# Patient Record
Sex: Female | Born: 1965 | Race: White | Hispanic: No | Smoking: Never smoker
Health system: Southern US, Community
[De-identification: ages and names within clinical notes are randomized; demographics above are authoritative.]

## PROBLEM LIST (undated history)

## (undated) DIAGNOSIS — R600 Localized edema: Secondary | ICD-10-CM

## (undated) DIAGNOSIS — L723 Sebaceous cyst: Secondary | ICD-10-CM

## (undated) DIAGNOSIS — R7303 Prediabetes: Secondary | ICD-10-CM

## (undated) DIAGNOSIS — E538 Deficiency of other specified B group vitamins: Secondary | ICD-10-CM

## (undated) DIAGNOSIS — G4700 Insomnia, unspecified: Secondary | ICD-10-CM

## (undated) DIAGNOSIS — F909 Attention-deficit hyperactivity disorder, unspecified type: Secondary | ICD-10-CM

## (undated) DIAGNOSIS — J309 Allergic rhinitis, unspecified: Secondary | ICD-10-CM

## (undated) DIAGNOSIS — I8000 Phlebitis and thrombophlebitis of superficial vessels of unspecified lower extremity: Secondary | ICD-10-CM

## (undated) DIAGNOSIS — E559 Vitamin D deficiency, unspecified: Secondary | ICD-10-CM

## (undated) DIAGNOSIS — Z86718 Personal history of other venous thrombosis and embolism: Secondary | ICD-10-CM

## (undated) DIAGNOSIS — R0789 Other chest pain: Secondary | ICD-10-CM

## (undated) DIAGNOSIS — F329 Major depressive disorder, single episode, unspecified: Secondary | ICD-10-CM

## (undated) DIAGNOSIS — F410 Panic disorder [episodic paroxysmal anxiety] without agoraphobia: Secondary | ICD-10-CM

## (undated) DIAGNOSIS — F319 Bipolar disorder, unspecified: Secondary | ICD-10-CM

## (undated) DIAGNOSIS — K5900 Constipation, unspecified: Secondary | ICD-10-CM

## (undated) DIAGNOSIS — B070 Plantar wart: Secondary | ICD-10-CM

## (undated) DIAGNOSIS — F411 Generalized anxiety disorder: Secondary | ICD-10-CM

## (undated) HISTORY — DX: Attention-deficit hyperactivity disorder, unspecified type: F90.9

## (undated) HISTORY — DX: Constipation, unspecified: K59.00

## (undated) HISTORY — DX: Generalized anxiety disorder: F41.1

## (undated) HISTORY — DX: Prediabetes: R73.03

## (undated) HISTORY — DX: Insomnia, unspecified: G47.00

## (undated) HISTORY — DX: Vitamin D deficiency, unspecified: E55.9

## (undated) HISTORY — DX: Sebaceous cyst: L72.3

## (undated) HISTORY — DX: Other chest pain: R07.89

## (undated) HISTORY — DX: Deficiency of other specified B group vitamins: E53.8

## (undated) HISTORY — DX: Allergic rhinitis, unspecified: J30.9

## (undated) HISTORY — DX: Panic disorder (episodic paroxysmal anxiety): F41.0

## (undated) HISTORY — PX: TONSILLECTOMY: SUR1361

## (undated) HISTORY — PX: OTHER SURGICAL HISTORY: SHX169

## (undated) HISTORY — DX: Phlebitis and thrombophlebitis of superficial vessels of unspecified lower extremity: I80.00

## (undated) HISTORY — DX: Major depressive disorder, single episode, unspecified: F32.9

## (undated) HISTORY — DX: Personal history of other venous thrombosis and embolism: Z86.718

## (undated) HISTORY — DX: Localized edema: R60.0

## (undated) HISTORY — DX: Plantar wart: B07.0

## (undated) HISTORY — DX: Bipolar disorder, unspecified: F31.9

---

## 1999-04-20 ENCOUNTER — Other Ambulatory Visit: Admission: RE | Admit: 1999-04-20 | Discharge: 1999-04-20 | Payer: Self-pay | Admitting: Obstetrics and Gynecology

## 1999-10-19 ENCOUNTER — Ambulatory Visit (HOSPITAL_COMMUNITY): Admission: RE | Admit: 1999-10-19 | Discharge: 1999-10-19 | Payer: Self-pay | Admitting: Internal Medicine

## 1999-12-28 ENCOUNTER — Ambulatory Visit (HOSPITAL_COMMUNITY): Admission: RE | Admit: 1999-12-28 | Discharge: 1999-12-28 | Payer: Self-pay | Admitting: Internal Medicine

## 1999-12-28 ENCOUNTER — Encounter: Payer: Self-pay | Admitting: Internal Medicine

## 2000-04-25 ENCOUNTER — Other Ambulatory Visit: Admission: RE | Admit: 2000-04-25 | Discharge: 2000-04-25 | Payer: Self-pay | Admitting: Obstetrics and Gynecology

## 2002-12-05 ENCOUNTER — Other Ambulatory Visit: Admission: RE | Admit: 2002-12-05 | Discharge: 2002-12-05 | Payer: Self-pay | Admitting: Obstetrics & Gynecology

## 2003-12-25 ENCOUNTER — Other Ambulatory Visit: Admission: RE | Admit: 2003-12-25 | Discharge: 2003-12-25 | Payer: Self-pay | Admitting: Obstetrics and Gynecology

## 2005-01-04 ENCOUNTER — Ambulatory Visit: Payer: Self-pay | Admitting: Internal Medicine

## 2005-01-26 ENCOUNTER — Ambulatory Visit: Payer: Self-pay | Admitting: Internal Medicine

## 2005-04-28 ENCOUNTER — Ambulatory Visit: Payer: Self-pay | Admitting: Internal Medicine

## 2005-08-29 ENCOUNTER — Ambulatory Visit: Payer: Self-pay | Admitting: Internal Medicine

## 2006-06-17 ENCOUNTER — Ambulatory Visit: Payer: Self-pay | Admitting: Family Medicine

## 2006-06-19 ENCOUNTER — Ambulatory Visit: Payer: Self-pay | Admitting: Family Medicine

## 2007-10-09 ENCOUNTER — Telehealth: Payer: Self-pay | Admitting: Internal Medicine

## 2008-01-10 ENCOUNTER — Ambulatory Visit: Payer: Self-pay | Admitting: Internal Medicine

## 2008-01-10 DIAGNOSIS — F3289 Other specified depressive episodes: Secondary | ICD-10-CM

## 2008-01-10 DIAGNOSIS — F410 Panic disorder [episodic paroxysmal anxiety] without agoraphobia: Secondary | ICD-10-CM | POA: Insufficient documentation

## 2008-01-10 DIAGNOSIS — F3170 Bipolar disorder, currently in remission, most recent episode unspecified: Secondary | ICD-10-CM | POA: Insufficient documentation

## 2008-01-10 DIAGNOSIS — R0789 Other chest pain: Secondary | ICD-10-CM | POA: Insufficient documentation

## 2008-01-10 DIAGNOSIS — F411 Generalized anxiety disorder: Secondary | ICD-10-CM

## 2008-01-10 DIAGNOSIS — J309 Allergic rhinitis, unspecified: Secondary | ICD-10-CM

## 2008-01-10 DIAGNOSIS — F319 Bipolar disorder, unspecified: Secondary | ICD-10-CM | POA: Insufficient documentation

## 2008-01-10 DIAGNOSIS — F329 Major depressive disorder, single episode, unspecified: Secondary | ICD-10-CM

## 2008-01-10 HISTORY — DX: Other specified depressive episodes: F32.89

## 2008-01-10 HISTORY — DX: Other chest pain: R07.89

## 2008-01-10 HISTORY — DX: Major depressive disorder, single episode, unspecified: F32.9

## 2008-01-10 HISTORY — DX: Allergic rhinitis, unspecified: J30.9

## 2008-01-10 HISTORY — DX: Generalized anxiety disorder: F41.1

## 2008-01-10 HISTORY — DX: Panic disorder (episodic paroxysmal anxiety): F41.0

## 2008-01-10 HISTORY — DX: Bipolar disorder, unspecified: F31.9

## 2008-04-20 ENCOUNTER — Emergency Department (HOSPITAL_COMMUNITY): Admission: EM | Admit: 2008-04-20 | Discharge: 2008-04-20 | Payer: Self-pay | Admitting: Emergency Medicine

## 2008-04-22 ENCOUNTER — Ambulatory Visit: Payer: Self-pay | Admitting: Internal Medicine

## 2008-05-26 ENCOUNTER — Ambulatory Visit: Payer: Self-pay | Admitting: Internal Medicine

## 2008-05-26 ENCOUNTER — Telehealth: Payer: Self-pay | Admitting: Internal Medicine

## 2008-05-26 DIAGNOSIS — S93409A Sprain of unspecified ligament of unspecified ankle, initial encounter: Secondary | ICD-10-CM | POA: Insufficient documentation

## 2008-06-06 ENCOUNTER — Telehealth: Payer: Self-pay | Admitting: Internal Medicine

## 2008-06-25 ENCOUNTER — Telehealth: Payer: Self-pay | Admitting: Internal Medicine

## 2008-07-03 ENCOUNTER — Emergency Department (HOSPITAL_COMMUNITY): Admission: EM | Admit: 2008-07-03 | Discharge: 2008-07-04 | Payer: Self-pay | Admitting: Emergency Medicine

## 2008-07-04 ENCOUNTER — Encounter: Payer: Self-pay | Admitting: Internal Medicine

## 2008-07-09 ENCOUNTER — Encounter: Payer: Self-pay | Admitting: Internal Medicine

## 2008-07-14 ENCOUNTER — Ambulatory Visit: Payer: Self-pay | Admitting: Internal Medicine

## 2008-07-14 ENCOUNTER — Telehealth: Payer: Self-pay | Admitting: Internal Medicine

## 2008-07-21 ENCOUNTER — Telehealth (INDEPENDENT_AMBULATORY_CARE_PROVIDER_SITE_OTHER): Payer: Self-pay | Admitting: *Deleted

## 2008-07-24 ENCOUNTER — Telehealth: Payer: Self-pay | Admitting: Internal Medicine

## 2008-12-29 ENCOUNTER — Ambulatory Visit: Payer: Self-pay | Admitting: Internal Medicine

## 2008-12-29 DIAGNOSIS — R1031 Right lower quadrant pain: Secondary | ICD-10-CM | POA: Insufficient documentation

## 2008-12-29 DIAGNOSIS — B070 Plantar wart: Secondary | ICD-10-CM

## 2008-12-29 HISTORY — DX: Plantar wart: B07.0

## 2009-01-14 ENCOUNTER — Encounter: Payer: Self-pay | Admitting: Internal Medicine

## 2009-02-19 ENCOUNTER — Ambulatory Visit (HOSPITAL_COMMUNITY): Admission: RE | Admit: 2009-02-19 | Discharge: 2009-02-19 | Payer: Self-pay | Admitting: Surgery

## 2009-03-03 ENCOUNTER — Encounter: Payer: Self-pay | Admitting: Internal Medicine

## 2009-03-20 ENCOUNTER — Encounter: Payer: Self-pay | Admitting: Internal Medicine

## 2009-03-23 ENCOUNTER — Telehealth: Payer: Self-pay | Admitting: Internal Medicine

## 2009-03-25 ENCOUNTER — Telehealth (INDEPENDENT_AMBULATORY_CARE_PROVIDER_SITE_OTHER): Payer: Self-pay | Admitting: *Deleted

## 2009-03-25 ENCOUNTER — Ambulatory Visit: Payer: Self-pay | Admitting: Internal Medicine

## 2009-04-22 ENCOUNTER — Ambulatory Visit: Payer: Self-pay | Admitting: Internal Medicine

## 2009-06-17 ENCOUNTER — Telehealth: Payer: Self-pay | Admitting: Internal Medicine

## 2009-06-17 ENCOUNTER — Ambulatory Visit: Payer: Self-pay | Admitting: Internal Medicine

## 2009-06-17 DIAGNOSIS — M542 Cervicalgia: Secondary | ICD-10-CM | POA: Insufficient documentation

## 2009-07-15 ENCOUNTER — Telehealth (INDEPENDENT_AMBULATORY_CARE_PROVIDER_SITE_OTHER): Payer: Self-pay | Admitting: *Deleted

## 2009-07-29 ENCOUNTER — Ambulatory Visit: Payer: Self-pay | Admitting: Internal Medicine

## 2009-08-10 ENCOUNTER — Telehealth: Payer: Self-pay | Admitting: Internal Medicine

## 2009-08-14 ENCOUNTER — Encounter: Payer: Self-pay | Admitting: Internal Medicine

## 2009-08-21 ENCOUNTER — Telehealth: Payer: Self-pay | Admitting: *Deleted

## 2009-09-07 ENCOUNTER — Telehealth: Payer: Self-pay | Admitting: Internal Medicine

## 2009-09-21 ENCOUNTER — Telehealth: Payer: Self-pay | Admitting: *Deleted

## 2009-10-02 ENCOUNTER — Emergency Department (HOSPITAL_COMMUNITY): Admission: EM | Admit: 2009-10-02 | Discharge: 2009-10-02 | Payer: Self-pay | Admitting: Emergency Medicine

## 2009-10-06 ENCOUNTER — Telehealth: Payer: Self-pay | Admitting: Internal Medicine

## 2009-10-08 ENCOUNTER — Ambulatory Visit: Payer: Self-pay | Admitting: Family Medicine

## 2009-10-08 ENCOUNTER — Telehealth: Payer: Self-pay | Admitting: Family Medicine

## 2009-10-08 DIAGNOSIS — M25569 Pain in unspecified knee: Secondary | ICD-10-CM | POA: Insufficient documentation

## 2009-10-13 ENCOUNTER — Ambulatory Visit: Payer: Self-pay | Admitting: Sports Medicine

## 2009-10-14 ENCOUNTER — Telehealth (INDEPENDENT_AMBULATORY_CARE_PROVIDER_SITE_OTHER): Payer: Self-pay | Admitting: *Deleted

## 2009-10-20 ENCOUNTER — Ambulatory Visit: Payer: Self-pay | Admitting: Sports Medicine

## 2009-10-28 ENCOUNTER — Ambulatory Visit: Payer: Self-pay | Admitting: Internal Medicine

## 2009-10-28 DIAGNOSIS — F909 Attention-deficit hyperactivity disorder, unspecified type: Secondary | ICD-10-CM

## 2009-10-28 HISTORY — DX: Attention-deficit hyperactivity disorder, unspecified type: F90.9

## 2009-10-29 ENCOUNTER — Ambulatory Visit: Payer: Self-pay | Admitting: Sports Medicine

## 2009-10-30 ENCOUNTER — Encounter: Payer: Self-pay | Admitting: Sports Medicine

## 2009-11-01 ENCOUNTER — Encounter: Admission: RE | Admit: 2009-11-01 | Discharge: 2009-11-01 | Payer: Self-pay | Admitting: Sports Medicine

## 2009-11-02 ENCOUNTER — Encounter: Payer: Self-pay | Admitting: Sports Medicine

## 2009-11-10 ENCOUNTER — Ambulatory Visit: Payer: Self-pay | Admitting: Sports Medicine

## 2009-12-02 ENCOUNTER — Ambulatory Visit: Payer: Self-pay | Admitting: Sports Medicine

## 2009-12-03 ENCOUNTER — Encounter: Payer: Self-pay | Admitting: Internal Medicine

## 2009-12-03 ENCOUNTER — Encounter: Payer: Self-pay | Admitting: Cardiovascular Disease

## 2009-12-03 ENCOUNTER — Encounter: Payer: Self-pay | Admitting: Sports Medicine

## 2009-12-04 ENCOUNTER — Ambulatory Visit: Payer: Self-pay

## 2009-12-04 ENCOUNTER — Encounter: Payer: Self-pay | Admitting: Cardiovascular Disease

## 2009-12-15 ENCOUNTER — Ambulatory Visit (HOSPITAL_BASED_OUTPATIENT_CLINIC_OR_DEPARTMENT_OTHER): Admission: RE | Admit: 2009-12-15 | Discharge: 2009-12-15 | Payer: Self-pay | Admitting: Orthopedic Surgery

## 2009-12-17 ENCOUNTER — Encounter: Payer: Self-pay | Admitting: Sports Medicine

## 2010-01-04 ENCOUNTER — Ambulatory Visit: Payer: Self-pay | Admitting: Internal Medicine

## 2010-01-29 ENCOUNTER — Ambulatory Visit: Payer: Self-pay

## 2010-01-29 ENCOUNTER — Encounter: Payer: Self-pay | Admitting: Cardiovascular Disease

## 2010-01-29 DIAGNOSIS — M79609 Pain in unspecified limb: Secondary | ICD-10-CM | POA: Insufficient documentation

## 2010-04-13 ENCOUNTER — Ambulatory Visit: Payer: Self-pay | Admitting: Internal Medicine

## 2010-04-30 ENCOUNTER — Telehealth: Payer: Self-pay | Admitting: Internal Medicine

## 2010-06-09 ENCOUNTER — Encounter: Payer: Self-pay | Admitting: Sports Medicine

## 2010-07-05 ENCOUNTER — Ambulatory Visit: Payer: Self-pay | Admitting: Internal Medicine

## 2010-07-05 DIAGNOSIS — I8000 Phlebitis and thrombophlebitis of superficial vessels of unspecified lower extremity: Secondary | ICD-10-CM | POA: Insufficient documentation

## 2010-07-05 HISTORY — DX: Phlebitis and thrombophlebitis of superficial vessels of unspecified lower extremity: I80.00

## 2010-07-13 ENCOUNTER — Telehealth: Payer: Self-pay | Admitting: Internal Medicine

## 2010-08-19 ENCOUNTER — Telehealth: Payer: Self-pay | Admitting: Internal Medicine

## 2010-08-20 ENCOUNTER — Telehealth: Payer: Self-pay | Admitting: Internal Medicine

## 2010-08-30 ENCOUNTER — Telehealth: Payer: Self-pay | Admitting: Internal Medicine

## 2010-09-17 ENCOUNTER — Ambulatory Visit: Payer: Self-pay | Admitting: Family Medicine

## 2010-09-17 DIAGNOSIS — G4700 Insomnia, unspecified: Secondary | ICD-10-CM

## 2010-09-17 HISTORY — DX: Insomnia, unspecified: G47.00

## 2010-09-21 ENCOUNTER — Encounter: Payer: Self-pay | Admitting: Family Medicine

## 2010-09-21 ENCOUNTER — Telehealth: Payer: Self-pay | Admitting: Family Medicine

## 2010-09-27 ENCOUNTER — Telehealth: Payer: Self-pay | Admitting: Internal Medicine

## 2010-10-04 ENCOUNTER — Ambulatory Visit: Payer: Self-pay | Admitting: Internal Medicine

## 2010-10-11 ENCOUNTER — Telehealth: Payer: Self-pay | Admitting: Internal Medicine

## 2010-11-17 ENCOUNTER — Telehealth: Payer: Self-pay | Admitting: Internal Medicine

## 2010-11-24 ENCOUNTER — Ambulatory Visit
Admission: RE | Admit: 2010-11-24 | Discharge: 2010-11-24 | Payer: Self-pay | Source: Home / Self Care | Attending: Family Medicine | Admitting: Family Medicine

## 2010-12-13 ENCOUNTER — Telehealth: Payer: Self-pay | Admitting: Internal Medicine

## 2010-12-13 ENCOUNTER — Ambulatory Visit: Admit: 2010-12-13 | Payer: Self-pay | Admitting: Family Medicine

## 2010-12-13 ENCOUNTER — Emergency Department (HOSPITAL_COMMUNITY)
Admission: EM | Admit: 2010-12-13 | Discharge: 2010-12-14 | Disposition: A | Discharge status: Other Acute Inpatient Hospital | Payer: Self-pay | Source: Home / Self Care | Admitting: Emergency Medicine

## 2010-12-14 ENCOUNTER — Inpatient Hospital Stay (HOSPITAL_COMMUNITY)
Admission: AD | Admit: 2010-12-14 | Discharge: 2010-12-15 | Payer: Self-pay | Source: Home / Self Care | Attending: Psychiatry | Admitting: Psychiatry

## 2010-12-15 LAB — BASIC METABOLIC PANEL
BUN: 13 mg/dL (ref 6–23)
CO2: 28 mEq/L (ref 19–32)
Calcium: 9.5 mg/dL (ref 8.4–10.5)
Chloride: 103 mEq/L (ref 96–112)
Creatinine, Ser: 0.81 mg/dL (ref 0.4–1.2)
GFR calc Af Amer: >60 mL/min (ref >60)
GFR calc non Af Amer: >60 mL/min (ref >60)
Glucose, Bld: 107 mg/dL — ABNORMAL HIGH (ref 70–99)
Potassium: 4.2 mEq/L (ref 3.5–5.1)
Sodium: 140 mEq/L (ref 135–145)

## 2010-12-15 LAB — RAPID URINE DRUG SCREEN, HOSP PERFORMED
Amphetamines: NOT DETECTED
Barbiturates: NOT DETECTED
Benzodiazepines: NOT DETECTED
Cocaine: NOT DETECTED
Opiates: NOT DETECTED
Tetrahydrocannabinol: NOT DETECTED

## 2010-12-15 LAB — CBC
HCT: 38.5 % (ref 36.0–46.0)
Hemoglobin: 12.8 g/dL (ref 12.0–15.0)
MCH: 28.6 pg (ref 26.0–34.0)
MCHC: 33.2 g/dL (ref 30.0–36.0)
MCV: 86.1 fL (ref 78.0–100.0)
Platelets: 288 10*3/uL (ref 150–400)
RBC: 4.47 MIL/uL (ref 3.87–5.11)
RDW: 13 % (ref 11.5–15.5)
WBC: 6.9 10*3/uL (ref 4.0–10.5)

## 2010-12-15 LAB — ETHANOL: Alcohol, Ethyl (B): <5 mg/dL (ref 0–10)

## 2010-12-15 LAB — POCT PREGNANCY, URINE: Preg Test, Ur: NEGATIVE

## 2010-12-17 ENCOUNTER — Encounter: Payer: Self-pay | Admitting: *Deleted

## 2010-12-19 ENCOUNTER — Encounter: Payer: Self-pay | Admitting: Sports Medicine

## 2010-12-20 ENCOUNTER — Encounter: Payer: Self-pay | Admitting: *Deleted

## 2010-12-24 NOTE — H&P (Signed)
[name], [name] NO.:  [id number]  MEDICAL RECORD NO.:  [id number]          PATIENT TYPE:  IPS  LOCATION:  0400                          FACILITY:  [facility]  PHYSICIAN:  Anselm Jungling, MD  DATE OF BIRTH:  September 03, 1966  DATE OF ADMISSION:  12/14/2010 DATE OF DISCHARGE:                      PSYCHIATRIC ADMISSION ASSESSMENT   HISTORY OF PRESENT ILLNESS:  The patient brought herself to the emergency department stating that she needed to get sleep and did want to be left alone because she was not sure what she would do to herself. The patient reports she has had some new medication changes.  She has a history of bipolar disorder and was recently started on Prozac about 2 weeks ago.  Also had a change in her stimulant, switching from Vyvanse to Ritalin.  She began to feel unorganized in her thinking.  She felt that she was having increased energy with an improvement in her concentration but was not sleeping well.  She was having thoughts about not wanting to be here and to "shut herself off."  Denies any psychotic symptoms.  PAST PSYCHIATRIC HISTORY:  First admission to the [facility].  Has a history bipolar disorder.  Has a history of mania in the past.  She recently start seeing Dr. Lafayette Dragon for psychiatric outpatient services and [name] for counseling.  SOCIAL HISTORY:  This is a 45 year old divorced female.  She resides in [place].  She has an 50 year old child who lives with the father. 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  The patient denies any alcohol or substance use.  Primary care provider is Dr. Darryll Capers.  MEDICAL PROBLEMS:  The patient reports with feeling healthy.  Denies any acute or chronic health issues.  FAMILY HISTORY:  None.  MEDICATIONS:  Listed a recent change of medications: 1  Was started on Prozac 20 mg for approximately 2 months per her primary care provider. 1. Ambien 5 mg for  sleep per her PCP. 2. Klonopin 1 mg b.i.d. as needed.  Prescribed by Massie Maroon in the     past. 3. Focalin XR 40 mg daily.  Recently started by Dr. Lafayette Dragon for the past     2 weeks and was taken off Vyvanse and Aplenzin 348 mg daily for     depression. 4. Lamictal 200 mg b.i.d.  The patient reports compliance with her medications.  DRUG ALLERGIES:  No known allergies.  PHYSICAL EXAMINATION:  This is a middle-aged female (45 years old).  She was assessed in the emergency department.  The physical exam was reviewed.  Of note, the patient had pressured speech, did not make eye contact, was counseling fidgety and twitching her limbs but no repetitive facial movements.  Easily distracted by changes in environment, agitated.  Had to be asked questions multiple times.  Was forgetting questions and then refusing to answer.  She also was disheveled.  LABORATORY DATA:  A BMET just shows a glucose of 107.  Alcohol level less than 5.  CBC is within normal limits.  Urine drug screen is negative.  MENTAL STATUS EXAM:  The patient is fully  alert.  She is in the day room, is cooperative for interview.  She is casually dressed.  She has fair eye contact.  Initially laughing.  Mood seems elevated.  She does appear to have decreased concentration.  Her thought processes are somewhat scattered, although overall gives a fair account of her changes in her medications and her symptoms.  Her speech is somewhat rapid.  The patient does not appear to be psychotic.  She does appear, in addition to being somewhat disheveled and her elevated, she also appears tired in appearance.  DIAGNOSES:  AXIS I:  Bipolar disorder, hypomania. AXIS II:  Deferred. AXIS III:  Is no known medical conditions. AXIS IV:  Is deferred. AXIS V:  Current is 30.  PLAN:  Is to discontinue her Prozac and her stimulant.  We will continue with her mood stabilizer.  We will also have Klonopin available on a p.r.n. basis.  We will continue with her  Ambien for sleep and offer a repeat dose.  Her tentative length of stay at this time is 2 to 4 days.     Landry Corporal, N.P.   ______________________________ Anselm Jungling, MD    JO/MEDQ  D:  12/14/2010  T:  12/14/2010  Job:  [id number]  Electronically Signed by Kristin PatriciaP. on 12/21/2010 02:07:20 PM Electronically Signed by Kristin Flash MD on 12/22/2010 08:46:04 AM

## 2010-12-27 ENCOUNTER — Telehealth: Payer: Self-pay | Admitting: Internal Medicine

## 2010-12-30 NOTE — Progress Notes (Signed)
Summary: salon name  Phone Note Call from Patient Call back at Work Phone [phone]   Caller: Patient Call For: Stacie Glaze MD Summary of Call: [phone] Is asking for name of salon that you recommended for her pedicare. Initial call taken by: Lynann Beaver CMA,  July 13, 2010 4:08 PM  Follow-up for Phone Call        [facility] [address] Follow-up by: Willy Eddy, LPN,  July 13, 2010 4:28 PM  Additional Follow-up for Phone Call Additional follow up Details #1::        Left message with info. Additional Follow-up by: Lynann Beaver CMA,  July 13, 2010 4:34 PM

## 2010-12-30 NOTE — Letter (Signed)
Summary: *Referral Letter  [facility]  [address]   [place], [place] [address]   Phone: [phone]  Fax: [phone]    12/02/2009 Salvatore Marvel, M.D. [facility] [address] [place], [place] [address] Fax: [phone]  Dear [name]:  Thank you in advance for agreeing to see my patient:  [name] [address] [place], [place]  [address]  Phone: [phone]  Reason for Referral: This is the lady we discussed that I think may have an intra-articular loose body or more significant PF joint injury than noted on MRI.  Procedures Requested: your evaluation and arthroscopy if needed.  Current Medical Problems: 1)  ATTENTION DEFICIT HYPERACTIVITY DISORDER, ADULT (ICD-314.01) 2)  CLOSED FRACTURE OF PATELLA (ICD-822.0) 3)  KNEE PAIN (ICD-719.46) 4)  NECK PAIN (ICD-723.1)   Current Medications: 1)  APLENZIN 348 MG XR24H-TAB (BUPROPION HBR) one by mouth daily 2)  KLONOPIN 1 MG  TABS (CLONAZEPAM) two times a day as needed 3)  LAMICTAL 200 MG TABS (LAMOTRIGINE) 1 tab two times a day 4)  AMBIEN 5 MG  TABS (ZOLPIDEM TARTRATE) at bedtime as needed 5)  VYVANSE 70 MG CAPS (LISDEXAMFETAMINE DIMESYLATE) one by mouth daily 6)  VICODIN 5-500 MG TABS (HYDROCODONE-ACETAMINOPHEN) oine tab every 4-6 hours as needed pain 7)  PERCOCET 5-325 MG TABS (OXYCODONE-ACETAMINOPHEN) take one tab q4hr as needed pain   Thank you again for agreeing to see our patient; please contact us if you have any further questions or need additional information.  Sincerely,  Vincent Gros MD

## 2010-12-30 NOTE — Assessment & Plan Note (Signed)
Summary: 3 month rov/njr/pt rescd//ccm   Vital Signs:  Patient profile:   45 year old female Height:      71 inches Weight:      152 pounds BMI:     21.28 Temp:     98.2 degrees F oral Pulse rate:   72 / minute Resp:     14 per minute BP sitting:   110 / 70  (left arm)  Vitals Entered By: Willy Eddy, LPN (Apr 13, 2010 2:48 PM) CC: roa-med regimen check, Headache   CC:  roa-med regimen check and Headache.  History of Present Illness: follow up medications and discussion of ADD meds and the fact that the afternoon is problematic she has been on vyvance discussion of medications and the potential side effects medication cost are better with meeting deductable she also has bipolar dz  complicated by  some degreee of PMDD    Preventive Screening-Counseling & Management  Alcohol-Tobacco     Smoking Status: never  Problems Prior to Update: 1)  Leg Pain, Left  (ICD-729.5) 2)  Attention Deficit Hyperactivity Disorder, Adult  (ICD-314.01) 3)  Closed Fracture of Patella  (ICD-822.0) 4)  Knee Pain  (ICD-719.46) 5)  Neck Pain  (ICD-723.1) 6)  Add  (ICD-314.00) 7)  Plantar Wart, Left  (ICD-078.12) 8)  Wrist Sprain, Left  (ICD-842.00) 9)  Abdominal Pain Right Lower Quadrant  (ICD-789.03) 10)  Ankle Sprain  (ICD-845.00) 11)  Muscle Spasm, Lumbar Region  (ICD-724.8) 12)  Chest Pain, Atypical  (ICD-786.59) 13)  Family History of Cad Female 1st Degree Relative <50  (ICD-V17.3) 14)  Depression  (ICD-311) 15)  Panic Disorder  (ICD-300.01) 16)  Bipolar Disorder Unspecified  (ICD-296.80) 17)  Allergic Rhinitis  (ICD-477.9) 18)  Anxiety  (ICD-300.00)  Current Problems (verified): 1)  Leg Pain, Left  (ICD-729.5) 2)  Attention Deficit Hyperactivity Disorder, Adult  (ICD-314.01) 3)  Closed Fracture of Patella  (ICD-822.0) 4)  Knee Pain  (ICD-719.46) 5)  Neck Pain  (ICD-723.1) 6)  Add  (ICD-314.00) 7)  Plantar Wart, Left  (ICD-078.12) 8)  Wrist Sprain, Left  (ICD-842.00) 9)   Abdominal Pain Right Lower Quadrant  (ICD-789.03) 10)  Ankle Sprain  (ICD-845.00) 11)  Muscle Spasm, Lumbar Region  (ICD-724.8) 12)  Chest Pain, Atypical  (ICD-786.59) 13)  Family History of Cad Female 1st Degree Relative <50  (ICD-V17.3) 14)  Depression  (ICD-311) 15)  Panic Disorder  (ICD-300.01) 16)  Bipolar Disorder Unspecified  (ICD-296.80) 17)  Allergic Rhinitis  (ICD-477.9) 18)  Anxiety  (ICD-300.00)  Current Medications (verified): 1)  Aplenzin 348 Mg Xr24h-Tab (Bupropion Hbr) .... One By Mouth Daily 2)  Klonopin 1 Mg  Tabs (Clonazepam) .... Two Times A Day As Needed 3)  Lamictal 200 Mg Tabs (Lamotrigine) .[name].. 1 Tab Two Times A Day 4)  Ambien 5 Mg  Tabs (Zolpidem Tartrate) .... At Bedtime As Needed 5)  Vyvanse 70 Mg Caps (Lisdexamfetamine Dimesylate) .... One By Mouth Daily  Allergies (verified): No Known Drug Allergies  Past History:  Family History: Last updated: 01/10/2008 father  had stent Family History of CAD Female 1st degree relative <50 mother has ablation therapy  Social History: Last updated: 10/20/2009 Married  but separated Never Smoked 10 year old daughter lives with father now expecting first child  Risk Factors: Smoking Status: never (04/13/2010)  Past medical, surgical, family and social histories (including risk factors) reviewed, and no changes noted (except as noted below).  Past Medical History: Reviewed history from 01/10/2008 and  no changes required. Anxiety Bipolar disorder Allergic rhinitis  Past Surgical History: Reviewed history from 01/10/2008 and no changes required. Denies surgical history  Family History: Reviewed history from 01/10/2008 and no changes required. father  had stent Family History of CAD Female 1st degree relative <50 mother has ablation therapy  Social History: Reviewed history from 10/20/2009 and no changes required. Married  but separated Never Smoked 46 year old daughter lives with father now expecting  first child  Review of Systems  The patient denies anorexia, fever, weight loss, weight gain, vision loss, decreased hearing, hoarseness, chest pain, syncope, dyspnea on exertion, peripheral edema, prolonged cough, headaches, hemoptysis, abdominal pain, melena, hematochezia, severe indigestion/heartburn, hematuria, incontinence, genital sores, muscle weakness, suspicious skin lesions, transient blindness, difficulty walking, depression, unusual weight change, abnormal bleeding, enlarged lymph nodes, angioedema, and breast masses.    Physical Exam  General:  Well-developed,well-nourished,in no acute distress; alert,appropriate and cooperative throughout examination Head:  Normocephalic and atraumatic without obvious abnormalities. No apparent alopecia or balding. Eyes:  pupils equal and pupils round.   Ears:  R ear normal and L ear normal.   Neck:  No deformities, masses, or tenderness noted. Lungs:  Normal respiratory effort, chest expands symmetrically. Lungs are clear to auscultation, no crackles or wheezes. Heart:  Normal rate and regular rhythm. S1 and S2 normal without gallop, murmur, click, rub or other extra sounds. Abdomen:  the pt has signifiacnt swelling at the sight of the hernia Msk:  RT knee exam shows no effusion; stable ligaments although she is very lax on ligamentous testing of all. ; negative Mcmurray's and provocative meniscal tests; non painful patellar compression; patellar and quadriceps tendons unremarkable  Left moderate effusion extension is lacking about 10 deg felxion only to 90 deg before it is painful walks without getting to full extension laxity of ACL and PCL is similar to noninjured side compression of sup patella is uncomfortable McMurray - can't really do  Pulses:  R and L carotid,radial,femoral,dorsalis pedis and posterior tibial pulses are full and equal bilaterally Extremities:  MSK [place] I repeated this to see if any additional findings still a  slight calcified free body upper outer patella with some surrounding swelling some soft tissue swelling at fib head and upper lat tib condyle tendons intact cartilage appears intact no large effusion Neurologic:  No cranial nerve deficits noted. Station and gait are normal. Plantar reflexes are down-going bilaterally. DTRs are symmetrical throughout. Sensory, motor and coordinative functions appear intact.   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  Complete Medication List: 1)  Aplenzin 348 Mg Xr24h-tab (Bupropion hbr) .... One by mouth daily 2)  Klonopin 1 Mg Tabs (Clonazepam) .... Two times a day as needed 3)  Lamictal 200 Mg Tabs (Lamotrigine) .[name].. 1 tab two times a day 4)  Ambien 5 Mg Tabs (Zolpidem tartrate) .... At bedtime as needed 5)  Vyvanse 70 Mg Caps (Lisdexamfetamine dimesylate) .... One by mouth daily 6)  St Johns Wort 300 Mg Tabs ([address] johns wort) .... For the week prior and week during the menses take st john's wart 300 mg daily 7)  Ritalin 5 Mg Tabs (Methylphenidate hcl) .... One by mouth in afternoon for breakthrough  Patient Instructions: 1)  Please schedule a follow-up appointment in 3 months. Prescriptions: RITALIN 5 MG TABS (METHYLPHENIDATE HCL) one by mouth in afternoon for  breakthrough  #30 x 0   Entered and Authorized by:   Stacie Glaze MD   Signed by:   Stacie Glaze MD on 04/13/2010   Method used:   Print then Give to Patient   RxID:   [id number] APLENZIN 348 MG XR24H-TAB (BUPROPION HBR) one by mouth daily  #90 x 3   Entered and Authorized by:   Stacie Glaze MD   Signed by:   Stacie Glaze MD on 04/13/2010   Method used:   Print then Give to Patient   RxID:   [id number] VYVANSE 70 MG CAPS (LISDEXAMFETAMINE DIMESYLATE) one by mouth daily  #30 x 0   Entered by:   Willy Eddy, LPN    Authorized by:   Stacie Glaze MD   Signed by:   Willy Eddy, LPN on 84/69/6295   Method used:   Print then Give to Patient   RxID:   [id number] VYVANSE 70 MG CAPS (LISDEXAMFETAMINE DIMESYLATE) one by mouth daily  #30 x 0   Entered by:   Willy Eddy, LPN   Authorized by:   Stacie Glaze MD   Signed by:   Willy Eddy, LPN on 66/44/0347   Method used:   Print then Give to Patient   RxID:   [id number] VYVANSE 70 MG CAPS (LISDEXAMFETAMINE DIMESYLATE) one by mouth daily  #30 x 0   Entered by:   Willy Eddy, LPN   Authorized by:   Stacie Glaze MD   Signed by:   Willy Eddy, LPN on 51/88/4166   Method used:   Print then Give to Patient   RxID:   [id number]

## 2010-12-30 NOTE — Progress Notes (Signed)
Summary: call  Phone Note Call from Patient Call back at Home Phone [phone]   Caller: vm Reason for Call: Acute Illness, Talk to Nurse Summary of Call: Problem with bipolar med & my ADD med.   Initial call taken by: Rudy Jew, RN,  September 27, 2010 11:39 AM  Follow-up for Phone Call        needs 7     vyvanse until sees dr Lovell Sheehan next week Follow-up by: Willy Eddy, LPN,  September 27, 2010 11:53 AM    Prescriptions: VYVANSE 70 MG CAPS (LISDEXAMFETAMINE DIMESYLATE) one by mouth daily  #7 x 0   Entered by:   Willy Eddy, LPN   Authorized by:   Stacie Glaze MD   Signed by:   Willy Eddy, LPN on 14/78/2956   Method used:   Print then Give to Patient   RxID:   [id number]

## 2010-12-30 NOTE — Progress Notes (Signed)
Summary: recommendation  Phone Note Call from Patient Call back at Home Phone [phone]   Caller: Patient Call For: Stacie Glaze MD Summary of Call: Dr. Cristal Ford, Dr. Alvan Dame, Dr. Darlyn Read, [facility].   Pt is asking if Dr Lovell Sheehan would recommend one of these Chiropractors that are in network for her insurance. Initial call taken by: Lynann Beaver CMA,  August 19, 2010 1:38 PM  Follow-up for Phone Call        dr spell Follow-up by: Stacie Glaze MD,  August 20, 2010 8:18 AM  Additional Follow-up for Phone Call Additional follow up Details #1::        Left detailed message on pt's personal voice mail. Additional Follow-up by: Lynann Beaver CMA,  August 20, 2010 8:45 AM

## 2010-12-30 NOTE — Assessment & Plan Note (Signed)
Summary: fatigue/neck pain/cjr   Vital Signs:  Patient profile:   45 year old female Weight:      148 pounds Temp:     98.3 degrees F oral BP sitting:   138 / 90  (left arm) Cuff size:   regular  Vitals Entered By: Sid Falcon LPN (September 17, 2010 2:15 PM)  History of Present Illness: Patient seen with chronic neck pain for many months, if not years. Recently saw chiropractor and  has had 3 treatments with some improvement. X-rays were done. Pain is poorly localized and fairly diffuse.  Occasional associated headaches. No radiculopathy symptoms. Aspirin without relief. Poor sleep quality. No recent injury. Pain is moderate.  Patient also complains of increased fatigue. Chronic poor sleep. History bipolar treated with Lamictal and also takes Klonopin and Wellbutrin. Recent issues with decreased mood, low motivation and occasional crying spells. No suicidal ideation. Increased stressors.  Has set up for counseling to start Monday.  Chronic insomnia and requests refills Ambien until follow up with primary.  She had been trying to scale back.  Denies any specific new stressors.  Allergies (verified): No Known Drug Allergies  Past History:  Past Medical History: Last updated: 01/10/2008 Anxiety Bipolar disorder Allergic rhinitis  Past Surgical History: Last updated: 01/10/2008 Denies surgical history  Family History: Last updated: 01/10/2008 father  had stent Family History of CAD Female 1st degree relative <50 mother has ablation therapy  Social History: Last updated: 10/20/2009 Married  but separated Never Smoked 52 year old daughter lives with father now expecting first child  Risk Factors: Smoking Status: never (07/05/2010) PMH-FH-SH reviewed for relevance  Review of Systems  The patient denies anorexia, fever, weight loss, chest pain, and muscle weakness.    Physical Exam  General:  Well-developed,well-nourished,in no acute distress; alert,appropriate and  cooperative throughout examination Neck:  No deformities, masses, or tenderness noted. Lungs:  Normal respiratory effort, chest expands symmetrically. Lungs are clear to auscultation, no crackles or wheezes. Heart:  Normal rate and regular rhythm. S1 and S2 normal without gallop, murmur, click, rub or other extra sounds. Msk:  No deformity or scoliosis noted of thoracic or lumbar spine.   Extremities:  No clubbing, cyanosis, edema, or deformity noted with normal full range of motion of all joints.   Neurologic:  alert & oriented X3, cranial nerves II-XII intact, strength normal in all extremities, and DTRs symmetrical and normal.   Skin:  no rashes and no suspicious lesions.   Cervical Nodes:  No lymphadenopathy noted Psych:  not depressed appearing and moderately anxious.     Impression & Recommendations:  Problem # 1:  NECK PAIN (ICD-723.1) trial of NSAID.  Discussed conservative measures-moist heat, massage, continued chiropractic care. Her updated medication list for this problem includes:    Meloxicam 15 Mg Tabs (Meloxicam) ..... One by mouth once daily  Problem # 2:  BIPOLAR DISORDER UNSPECIFIED (ICD-296.80) Follow up for counseling and with primary if depression symptoms continue.  Problem # 3:  INSOMNIA (ICD-780.52)  Her updated medication list for this problem includes:    Ambien 5 Mg Tabs (Zolpidem tartrate) .[name]... At bedtime as needed  Complete Medication List: 1)  Aplenzin 348 Mg Xr24h-tab (Bupropion hbr) .... One by mouth daily 2)  Klonopin 1 Mg Tabs (Clonazepam) .... Two times a day as needed 3)  Lamictal 200 Mg Tabs (Lamotrigine) .[name].. 1 tab two times a day 4)  Ambien 5 Mg Tabs (Zolpidem tartrate) .... At bedtime as needed 5)  Vyvanse 70  Mg Caps (Lisdexamfetamine dimesylate) .... One by mouth daily 6)  St Johns Wort 300 Mg Tabs ([address] johns wort) .... For the week prior and week during the menses take st john's wart 300 mg daily 7)  Methylphenidate Hcl 5 Mg Tabs  (Methylphenidate hcl) .... At 4pm 8)  Meloxicam 15 Mg Tabs (Meloxicam) .... One by mouth once daily  Patient Instructions: 1)  Keep followup with Dr. Lovell Sheehan for early November as scheduled Prescriptions: AMBIEN 5 MG  TABS (ZOLPIDEM TARTRATE) at bedtime as needed  #30 x 0   Entered and Authorized by:   Evelena Peat MD   Signed by:   Evelena Peat MD on 09/17/2010   Method used:   Print then Give to Patient   RxID:   [id number] MELOXICAM 15 MG TABS (MELOXICAM) one by mouth once daily  #30 x 1   Entered and Authorized by:   Evelena Peat MD   Signed by:   Evelena Peat MD on 09/17/2010   Method used:   [facility]ron[facility] to        [facility] [address] [phone]* (retail)       [address]       [place], [place]  [address]       Ph: [phone] or [phone]       Fax: [phone]   RxID:   [id number]    Orders Added: 1)  Est. Patient Level IV [87564]

## 2010-12-30 NOTE — Progress Notes (Signed)
Summary: Refill Request  Phone Note Refill Request Message from:  Patient on October 11, 2010 1:49 PM  Refills Requested: Medication #1:  AMBIEN 5 MG  TABS at bedtime as needed   Dosage confirmed as above?Dosage Confirmed   Brand Name Necessary? No   Supply Requested: 1 month   [facility] [address]   Next Appointment Scheduled: 2.8.12 Initial call taken by: Harold Barban,  October 11, 2010 1:50 PM    Prescriptions: AMBIEN 5 MG  TABS (ZOLPIDEM TARTRATE) at bedtime as needed  #30 x 0   Entered by:   Lynann Beaver CMA AAMA   Authorized by:   Stacie Glaze MD   Signed by:   Lynann Beaver CMA AAMA on 10/11/2010   Method used:   Printed then faxed to ...       [facility] [address] [phone]* (retail)       [address]       [place], [place]  [address]       Ph: [phone] or [phone]       Fax: [phone]   RxID:   [id number]  telephoned to pharmacy.

## 2010-12-30 NOTE — Consult Note (Signed)
Summary: [facility]   Imported By: Marily Memos 12/08/2009 11:23:27  _____________________________________________________________________  External Attachment:    Type:   Image     Comment:   External Document

## 2010-12-30 NOTE — Assessment & Plan Note (Signed)
Summary: fup//ccm   Vital Signs:  Patient profile:   45 year old female Height:      71 inches Weight:      147 pounds BMI:     20.58 Temp:     98.2 degrees F oral Pulse rate:   72 / minute Resp:     14 per minute BP sitting:   120 / 70  (left arm)  Vitals Entered By: Willy Eddy, LPN (January 04, 2010 9:47 AM) CC: roa- pt c/o that vyvanse is not working as good as it did at first, Depression   CC:  roa- pt c/o that vyvanse is not working as good as it did at first and Depression.  History of Present Illness: left knee meniscal tear follow up medications refill adhd medicatons and give samploes of depression medications tday  Depression History:      The patient is having a depressed mood most of the day and has a diminished interest in her usual daily activities.        Suicide risk questions reveal that she wishes that she were dead and she has thought about ending her life.  The patient denies that she feels like life is not worth living.         Preventive Screening-Counseling & Management  Alcohol-Tobacco     Smoking Status: never  Problems Prior to Update: 1)  Attention Deficit Hyperactivity Disorder, Adult  (ICD-314.01) 2)  Closed Fracture of Patella  (ICD-822.0) 3)  Knee Pain  (ICD-719.46) 4)  Neck Pain  (ICD-723.1) 5)  Add  (ICD-314.00) 6)  Plantar Wart, Left  (ICD-078.12) 7)  Wrist Sprain, Left  (ICD-842.00) 8)  Abdominal Pain Right Lower Quadrant  (ICD-789.03) 9)  Ankle Sprain  (ICD-845.00) 10)  Muscle Spasm, Lumbar Region  (ICD-724.8) 11)  Chest Pain, Atypical  (ICD-786.59) 12)  Family History of Cad Female 1st Degree Relative <50  (ICD-V17.3) 13)  Depression  (ICD-311) 14)  Panic Disorder  (ICD-300.01) 15)  Bipolar Disorder Unspecified  (ICD-296.80) 16)  Allergic Rhinitis  (ICD-477.9) 17)  Anxiety  (ICD-300.00)  Current Problems (verified): 1)  Attention Deficit Hyperactivity Disorder, Adult  (ICD-314.01) 2)  Closed Fracture of Patella   (ICD-822.0) 3)  Knee Pain  (ICD-719.46) 4)  Neck Pain  (ICD-723.1) 5)  Add  (ICD-314.00) 6)  Plantar Wart, Left  (ICD-078.12) 7)  Wrist Sprain, Left  (ICD-842.00) 8)  Abdominal Pain Right Lower Quadrant  (ICD-789.03) 9)  Ankle Sprain  (ICD-845.00) 10)  Muscle Spasm, Lumbar Region  (ICD-724.8) 11)  Chest Pain, Atypical  (ICD-786.59) 12)  Family History of Cad Female 1st Degree Relative <50  (ICD-V17.3) 13)  Depression  (ICD-311) 14)  Panic Disorder  (ICD-300.01) 15)  Bipolar Disorder Unspecified  (ICD-296.80) 16)  Allergic Rhinitis  (ICD-477.9) 17)  Anxiety  (ICD-300.00)  Medications Prior to Update: 1)  Aplenzin 348 Mg Xr24h-Tab (Bupropion Hbr) .... One By Mouth Daily 2)  Klonopin 1 Mg  Tabs (Clonazepam) .... Two Times A Day As Needed 3)  Lamictal 200 Mg Tabs (Lamotrigine) .[name].. 1 Tab Two Times A Day 4)  Ambien 5 Mg  Tabs (Zolpidem Tartrate) .... At Bedtime As Needed 5)  Vyvanse 70 Mg Caps (Lisdexamfetamine Dimesylate) .... One By Mouth Daily 6)  Vicodin 5-500 Mg Tabs (Hydrocodone-Acetaminophen) .... Oine Tab Every 4-6 Hours As Needed Pain 7)  Percocet 5-325 Mg Tabs (Oxycodone-Acetaminophen) .... Take One Tab Q4hr As Needed Pain  Current Medications (verified): 1)  Aplenzin 348 Mg Xr24h-Tab (Bupropion  Hbr) .... One By Mouth Daily 2)  Klonopin 1 Mg  Tabs (Clonazepam) .... Two Times A Day As Needed 3)  Lamictal 200 Mg Tabs (Lamotrigine) .[name].. 1 Tab Two Times A Day 4)  Ambien 5 Mg  Tabs (Zolpidem Tartrate) .... At Bedtime As Needed 5)  Vyvanse 70 Mg Caps (Lisdexamfetamine Dimesylate) .... One By Mouth Daily  Allergies (verified): No Known Drug Allergies  Family History: Reviewed history from 01/10/2008 and no changes required. father  had stent Family History of CAD Female 1st degree relative <50 mother has ablation therapy  Social History: Reviewed history from 10/20/2009 and no changes required. Married  but separated Never Smoked 68 year old daughter lives with father now  expecting first child  Review of Systems  The patient denies anorexia, fever, weight loss, weight gain, vision loss, decreased hearing, hoarseness, chest pain, syncope, dyspnea on exertion, peripheral edema, prolonged cough, headaches, hemoptysis, abdominal pain, melena, hematochezia, severe indigestion/heartburn, hematuria, incontinence, genital sores, muscle weakness, suspicious skin lesions, transient blindness, difficulty walking, depression, unusual weight change, abnormal bleeding, enlarged lymph nodes, angioedema, and breast masses.    Physical Exam  General:  Well-developed,well-nourished,in no acute distress; alert,appropriate and cooperative throughout examination Head:  Normocephalic and atraumatic without obvious abnormalities. No apparent alopecia or balding. Eyes:  pupils equal and pupils round.   Ears:  R ear normal and L ear normal.   Neck:  No deformities, masses, or tenderness noted. Lungs:  Normal respiratory effort, chest expands symmetrically. Lungs are clear to auscultation, no crackles or wheezes. Heart:  Normal rate and regular rhythm. S1 and S2 normal without gallop, murmur, click, rub or other extra sounds. Abdomen:  the pt has signifiacnt swelling at the sight of the hernia   Impression & Recommendations:  Problem # 1:  ATTENTION DEFICIT HYPERACTIVITY DISORDER, ADULT (ICD-314.01) refil medications  Problem # 2:  KNEE PAIN (ICD-719.46) post surgery with good results The following medications were removed from the medication list:    Vicodin 5-500 Mg Tabs (Hydrocodone-acetaminophen) ..... Oine tab every 4-6 hours as needed pain    Percocet 5-325 Mg Tabs (Oxycodone-acetaminophen) .[name]... Take one tab q4hr as needed pain  Complete Medication List: 1)  Aplenzin 348 Mg Xr24h-tab (Bupropion hbr) .... One by mouth daily 2)  Klonopin 1 Mg Tabs (Clonazepam) .... Two times a day as needed 3)  Lamictal 200 Mg Tabs (Lamotrigine) .[name].. 1 tab two times a day 4)  Ambien 5 Mg  Tabs (Zolpidem tartrate) .... At bedtime as needed 5)  Vyvanse 70 Mg Caps (Lisdexamfetamine dimesylate) .... One by mouth daily  Patient Instructions: 1)  Please schedule a follow-up appointment in 3 months. Prescriptions: VYVANSE 70 MG CAPS (LISDEXAMFETAMINE DIMESYLATE) one by mouth daily  #30 x 0   Entered and Authorized by:   Stacie Glaze MD   Signed by:   Stacie Glaze MD on 01/04/2010   Method used:   Print then Give to Patient   RxID:   [id number] VYVANSE 70 MG CAPS (LISDEXAMFETAMINE DIMESYLATE) one by mouth daily  #30 x 0   Entered and Authorized by:   Stacie Glaze MD   Signed by:   Stacie Glaze MD on 01/04/2010   Method used:   Print then Give to Patient   RxID:   [id number] VYVANSE 70 MG CAPS (LISDEXAMFETAMINE DIMESYLATE) one by mouth daily  #30 x 0   Entered and Authorized by:   Stacie Glaze MD   Signed by:   Balinda Quails  Jenkins MD on 01/04/2010   Method used:   Print then Give to Patient   RxID:   [id number]

## 2010-12-30 NOTE — Assessment & Plan Note (Signed)
Summary: 3 MONTH ROV/NJR   Vital Signs:  Patient profile:   45 year old female Height:      71 inches Weight:      153 pounds BMI:     21.42 Temp:     98.2 degrees F oral Pulse rate:   72 / minute Resp:     14 per minute BP sitting:   110 / 70  (left arm)  Vitals Entered By: Willy Eddy, LPN (July 05, 2010 4:22 PM) CC: roa- med review Is Patient Diabetic? No   CC:  roa- med review.  History of Present Illness: ADD management having touble focusing at work late in the dsy superficial phebitis of leg with hx of varicose viens very active hx of multiple msk injuries still has menstrual head aches  cluster type no change in intensity or pattern  Preventive Screening-Counseling & Management  Alcohol-Tobacco     Smoking Status: never  Problems Prior to Update: 1)  Phlebitis&thrombophleb Sup Vessels Lower Extrem  (ICD-451.0) 2)  Premenstrual Tension Syndromes  (ICD-625.4) 3)  Leg Pain, Left  (ICD-729.5) 4)  Attention Deficit Hyperactivity Disorder, Adult  (ICD-314.01) 5)  Closed Fracture of Patella  (ICD-822.0) 6)  Knee Pain  (ICD-719.46) 7)  Neck Pain  (ICD-723.1) 8)  Add  (ICD-314.00) 9)  Plantar Wart, Left  (ICD-078.12) 10)  Wrist Sprain, Left  (ICD-842.00) 11)  Abdominal Pain Right Lower Quadrant  (ICD-789.03) 12)  Ankle Sprain  (ICD-845.00) 13)  Muscle Spasm, Lumbar Region  (ICD-724.8) 14)  Chest Pain, Atypical  (ICD-786.59) 15)  Family History of Cad Female 1st Degree Relative <50  (ICD-V17.3) 16)  Depression  (ICD-311) 17)  Panic Disorder  (ICD-300.01) 18)  Bipolar Disorder Unspecified  (ICD-296.80) 19)  Allergic Rhinitis  (ICD-477.9) 20)  Anxiety  (ICD-300.00)  Current Problems (verified): 1)  Premenstrual Tension Syndromes  (ICD-625.4) 2)  Leg Pain, Left  (ICD-729.5) 3)  Attention Deficit Hyperactivity Disorder, Adult  (ICD-314.01) 4)  Closed Fracture of Patella  (ICD-822.0) 5)  Knee Pain  (ICD-719.46) 6)  Neck Pain  (ICD-723.1) 7)  Add   (ICD-314.00) 8)  Plantar Wart, Left  (ICD-078.12) 9)  Wrist Sprain, Left  (ICD-842.00) 10)  Abdominal Pain Right Lower Quadrant  (ICD-789.03) 11)  Ankle Sprain  (ICD-845.00) 12)  Muscle Spasm, Lumbar Region  (ICD-724.8) 13)  Chest Pain, Atypical  (ICD-786.59) 14)  Family History of Cad Female 1st Degree Relative <50  (ICD-V17.3) 15)  Depression  (ICD-311) 16)  Panic Disorder  (ICD-300.01) 17)  Bipolar Disorder Unspecified  (ICD-296.80) 18)  Allergic Rhinitis  (ICD-477.9) 19)  Anxiety  (ICD-300.00)  Medications Prior to Update: 1)  Aplenzin 348 Mg Xr24h-Tab (Bupropion Hbr) .... One By Mouth Daily 2)  Klonopin 1 Mg  Tabs (Clonazepam) .... Two Times A Day As Needed 3)  Lamictal 200 Mg Tabs (Lamotrigine) .[name].. 1 Tab Two Times A Day 4)  Ambien 5 Mg  Tabs (Zolpidem Tartrate) .... At Bedtime As Needed 5)  Vyvanse 70 Mg Caps (Lisdexamfetamine Dimesylate) .... One By Mouth Daily 6)  [employer] Wort 300 Mg Tabs ([address] [place] Wort) .... For The Week Prior and Week During The Menses Take [facility] Andrell Tall[employer]'s Wart 300 Mg Daily  Current Medications (verified): 1)  Aplenzin 348 Mg Xr24h-Tab (Bupropion Hbr) .... One By Mouth Daily 2)  Klonopin 1 Mg  Tabs (Clonazepam) .... Two Times A Day As Needed 3)  Lamictal 200 Mg Tabs (Lamotrigine) .[name].. 1 Tab Two Times A Day 4)  Ambien  5 Mg  Tabs (Zolpidem Tartrate) .... At Bedtime As Needed 5)  Vyvanse 70 Mg Caps (Lisdexamfetamine Dimesylate) .... One By Mouth Daily 6)  [employer] Wort 300 Mg Tabs ([address] Johns Wort) .... For The Week Prior and Week During The Menses Take St An[employer]'s Wart 300 Mg Daily 7)  Adderall 10 Mg Tabs (Amphetamine-Dextroamphetamine) .... One By Mouth At 4 Pm  Allergies (verified): No Known Drug Allergies  Past History:  Past Medical History: Last updated: 01/10/2008 Anxiety Bipolar disorder Allergic rhinitis  Past Surgical History: Last updated: 01/10/2008 Denies surgical history  Family History: Last updated: 01/10/2008 father  had stent Family  History of CAD Female 1st degree relative <50 mother has ablation therapy  Social History: Last updated: 10/20/2009 Married  but separated Never Smoked 54 year old daughter lives with father now expecting first child  Risk Factors: Smoking Status: never (07/05/2010) PMH-FH-SH reviewed-no changes except otherwise noted  Family History: Reviewed history from 01/10/2008 and no changes required. father  had stent Family History of CAD Female 1st degree relative <50 mother has ablation therapy  Social History: Reviewed history from 10/20/2009 and no changes required. Married  but separated Never Smoked 64 year old daughter lives with father now expecting first child  Review of Systems  The patient denies anorexia, fever, weight loss, weight gain, vision loss, decreased hearing, hoarseness, chest pain, syncope, dyspnea on exertion, peripheral edema, prolonged cough, headaches, hemoptysis, abdominal pain, melena, hematochezia, severe indigestion/heartburn, hematuria, incontinence, genital sores, muscle weakness, suspicious skin lesions, transient blindness, difficulty walking, depression, unusual weight change, abnormal bleeding, enlarged lymph nodes, angioedema, and breast masses.    Physical Exam  General:  Well-developed,well-nourished,in no acute distress; alert,appropriate and cooperative throughout examination Head:  Normocephalic and atraumatic without obvious abnormalities. No apparent alopecia or balding. Eyes:  pupils equal and pupils round.   Neck:  No deformities, masses, or tenderness noted. Lungs:  Normal respiratory effort, chest expands symmetrically. Lungs are clear to auscultation, no crackles or wheezes. Heart:  Normal rate and regular rhythm. S1 and S2 normal without gallop, murmur, click, rub or other extra sounds. Abdomen:  the pt has signifiacnt swelling at the sight of the hernia   Impression & Recommendations:  Problem # 1:  ATTENTION DEFICIT HYPERACTIVITY  DISORDER, ADULT (ICD-314.01)  Problem # 2:  PHLEBITIS&THROMBOPHLEB SUP VESSELS LOWER EXTREM (ICD-451.0)  Orders: Durable Medical Equipment (DME)  Problem # 3:  PREMENSTRUAL TENSION SYNDROMES (ICD-625.4) haed ache pattern stable  Complete Medication List: 1)  Aplenzin 348 Mg Xr24h-tab (Bupropion hbr) .... One by mouth daily 2)  Klonopin 1 Mg Tabs (Clonazepam) .... Two times a day as needed 3)  Lamictal 200 Mg Tabs (Lamotrigine) .[name].. 1 tab two times a day 4)  Ambien 5 Mg Tabs (Zolpidem tartrate) .... At bedtime as needed 5)  Vyvanse 70 Mg Caps (Lisdexamfetamine dimesylate) .... One by mouth daily 6)  St [place] Wort 300 Mg Tabs ([address] johns wort) .... For the week prior and week during the menses take st [place] Scheurich's wart 300 mg daily 7)  Adderall 10 Mg Tabs (Amphetamine-dextroamphetamine) .... One by mouth at 4 pm  Patient Instructions: 1)  Please schedule a follow-up appointment in 3 months. Prescriptions: ADDERALL 10 MG TABS (AMPHETAMINE-DEXTROAMPHETAMINE) one by mouth at 4 pm  #30 x 0   Entered and Authorized by:   Stacie Glaze MD   Signed by:   Stacie Glaze MD on 07/05/2010   Method used:   Print then Give to Patient  RxID:   [id number] AMBIEN 5 MG  TABS (ZOLPIDEM TARTRATE) at bedtime as needed  #30 x 2   Entered by:   Willy Eddy, LPN   Authorized by:   Stacie Glaze MD   Signed by:   Willy Eddy, LPN on 14/78/2956   Method used:   Print then Give to Patient   RxID:   [id number] KLONOPIN 1 MG  TABS (CLONAZEPAM) two times a day as needed  #60 x 2   Entered by:   Willy Eddy, LPN   Authorized by:   Stacie Glaze MD   Signed by:   Willy Eddy, LPN on 28/41/3244   Method used:   Print then Give to Patient   RxID:   [id number] VYVANSE 70 MG CAPS (LISDEXAMFETAMINE DIMESYLATE) one by mouth daily  #30 x 0   Entered by:   Willy Eddy, LPN   Authorized by:   Stacie Glaze MD   Signed by:   Willy Eddy, LPN on 42/59/5638   Method  used:   Print then Give to Patient   RxID:   [id number] VYVANSE 70 MG CAPS (LISDEXAMFETAMINE DIMESYLATE) one by mouth daily  #30 x 0   Entered by:   Willy Eddy, LPN   Authorized by:   Stacie Glaze MD   Signed by:   Willy Eddy, LPN on 05/27/1600   Method used:   Print then Give to Patient   RxID:   [id number] VYVANSE 70 MG CAPS (LISDEXAMFETAMINE DIMESYLATE) one by mouth daily  #30 x 0   Entered by:   Willy Eddy, LPN   Authorized by:   Stacie Glaze MD   Signed by:   Willy Eddy, LPN on 70/62/3762   Method used:   Print then Give to Patient   RxID:   [id number]

## 2010-12-30 NOTE — Progress Notes (Signed)
Summary: REFILL REQUEST / REQ FOR RETURN CALL TO DISCUSS MED  Phone Note Refill Request Message from:  Patient  [phone] on August 20, 2010 8:16 AM  Refills Requested: Medication #1:  AMBIEN 5 MG  TABS at bedtime as needed   Notes: [facility] [employer].  Pt would like to speak with someone in ref to how to d/c this medication because she has tried to gradually stop taking the med [name]) and is having problems.   Initial call taken by: Debbra Riding,  August 20, 2010 8:16 AM  Follow-up for Phone Call        she will just need to tritate off. maybe start at 1 every other night for several nights, then 1 every 3 third night for several nights,then stop Follow-up by: Willy Eddy, LPN,  August 20, 2010 9:27 AM  Additional Follow-up for Phone Call Additional follow up Details #1::        LMTCB Additional Follow-up by: Lynann Beaver CMA,  August 20, 2010 9:42 AM    Additional Follow-up for Phone Call Additional follow up Details #2::    Pt bent over today and felt a tear in lower back about 10 minutes ago.  Has ice on it and wants to see Dr. Lovell Sheehan today.  Needs refill on Ambien. Follow-up by: Lynann Beaver CMA,  August 20, 2010 11:15 AM  Additional Follow-up for Phone Call Additional follow up Details #3:: Details for Additional Follow-up Action Taken: instructed to go to [facility] for pull in back-  Prescriptions: AMBIEN 5 MG  TABS (ZOLPIDEM TARTRATE) at bedtime as needed  #10 x 0   Entered by:   Willy Eddy, LPN   Authorized by:   Stacie Glaze MD   Signed by:   Willy Eddy, LPN on 21/30/8657   Method used:   Telephoned to ...       [facility] [address] [phone]* (retail)       [address]       [place], [place]  [address]       Ph: [phone] or [phone]       Fax: [phone]   RxID:   [id number]

## 2010-12-30 NOTE — Miscellaneous (Signed)
Summary: Flu Vaccination/[facility]  Flu Vaccination/[facility]   Imported By: Maryln Gottron 12/17/2009 14:22:47  _____________________________________________________________________  External Attachment:    Type:   Image     Comment:   External Document

## 2010-12-30 NOTE — Progress Notes (Signed)
Summary: REFILL REQUEST  Phone Note Refill Request Message from:  Patient on November 17, 2010 12:18 PM  Refills Requested: Medication #1:  AMBIEN 5 MG  TABS at bedtime as needed   Dosage confirmed as above?Dosage Confirmed   Notes[facility]  - [address]. Initial call taken by: Debbra Riding,  November 17, 2010 12:18 PM    Prescriptions: AMBIEN 5 MG  TABS (ZOLPIDEM TARTRATE) at bedtime as needed  #30 x 2   Entered by:   Willy Eddy, LPN   Authorized by:   Stacie Glaze MD   Signed by:   Willy Eddy, LPN on 16/08/9603   Method used:   Telephoned to ...       [facility] [address] [phone]* (retail)       [address]       [place], [place]  [address]       Ph: [phone] or [phone]       Fax: [phone]   RxID:   [id number]

## 2010-12-30 NOTE — Assessment & Plan Note (Signed)
Summary: F/U,MC   Vital Signs:  Patient profile:   45 year old female BP sitting:   121 / 88  Vitals Entered By: Lillia Pauls CMA (December 02, 2009 3:38 PM)  History of Present Illness: After 8 weeks Kamyia does not really fell a lot of relief. While swelling is decreased it still swells each night. The area on front of patella is less painful. However, she has difficulty straightening the knee and can only bend to 90 degs.  Wore Don Joy knee support some but does not feel that much help from this.  Hurts enough to take percocet usually 3 per day.  MRI showed possible partial ACL &PCL tear and ? of chondral lesion of patella.  she has not had true locking but has never had giving out.  p[rimary symtpom is that the knee is too painful to try to get full movement.  Allergies: No Known Drug Allergies  Physical Exam  General:  Well-developed,well-nourished,in no acute distress; alert,appropriate and cooperative throughout examination Msk:  RT knee exam shows no effusion; stable ligaments although she is very lax on ligamentous testing of all. ; negative Mcmurray's and provocative meniscal tests; non painful patellar compression; patellar and quadriceps tendons unremarkable  Left moderate effusion extension is lacking about 10 deg felxion only to 90 deg before it is painful walks without getting to full extension laxity of ACL and PCL is similar to noninjured side compression of sup patella is uncomfortable McMurray - can't really do    Impression & Recommendations:  Problem # 1:  KNEE PAIN (ICD-719.46)  Her updated medication list for this problem includes:    Vicodin 5-500 Mg Tabs (Hydrocodone-acetaminophen) ...stop    Percocet 5-325 Mg Tabs (Oxycodone-acetaminophen) .[name]... Take one tab q4hr as needed pain - given 60 and try to limit by using NSAIDs when pain not too severe  Problem # 2:  CLOSED FRACTURE OF PATELLA (ICD-822.0) I think she had a small patellar fracture  but I wound wonder about a bigger osteochondral injury to PF joint that MRI did not clearly identify she may have partial ACL or PCL tear but this does not seem primary injury mechanism was fall directly onto patella  I would like to get Dr Thurston Hole to evaluate and advise if he thinks this is surgical/ loose body/ something else we are missing  Complete Medication List: 1)  Aplenzin 348 Mg Xr24h-tab (Bupropion hbr) .... One by mouth daily 2)  Klonopin 1 Mg Tabs (Clonazepam) .... Two times a day as needed 3)  Lamictal 200 Mg Tabs (Lamotrigine) .[name].. 1 tab two times a day 4)  Ambien 5 Mg Tabs (Zolpidem tartrate) .... At bedtime as needed 5)  Vyvanse 70 Mg Caps (Lisdexamfetamine dimesylate) .... One by mouth daily 6)  Vicodin 5-500 Mg Tabs (Hydrocodone-acetaminophen) .... Oine tab every 4-6 hours as needed pain 7)  Percocet 5-325 Mg Tabs (Oxycodone-acetaminophen) .... Take one tab q4hr as needed pain  Patient Instructions: 1)  APPT WITH DR Thurston Hole IS [facility], JAN 6TH AT 9AM. [address] [phone]

## 2010-12-30 NOTE — Miscellaneous (Signed)
Summary: Orders Update  Clinical Lists Changes  Problems: Added new problem of LEG PAIN, LEFT (ICD-729.5) Orders: Added new Test order of Venous Duplex Lower Extremity (Venous Duplex Lower) - Signed 

## 2010-12-30 NOTE — Assessment & Plan Note (Signed)
Summary: 3 MONTH ROV/NJR   Vital Signs:  Patient profile:   45 year old female Height:      71 inches Weight:      146 pounds BMI:     20.44 Temp:     98.2 degrees F oral Pulse rate:   72 / minute Resp:     14 per minute BP sitting:   130 / 84  (left arm)  Vitals Entered By: Willy Eddy, LPN (October 04, 2010 4:33 PM) CC: roal med review Is Patient Diabetic? No   Primary Care Provider:  Stacie Glaze MD  CC:  roal med review.  History of Present Illness: the pt has been to the chiopracter and the adjustments were "disturbing" she saw Dr Caryl Never and was given meloxican and this has helped HEad aches due to cervial strain? the pt was hit with depression with the onset of her menses... different from the bipolar? lasted 2 weeks... then back to baseline needs ADHD refills had extreme mentral pain?   Preventive Screening-Counseling & Management  Alcohol-Tobacco     Smoking Status: never     Tobacco Counseling: not indicated; no tobacco use  Problems Prior to Update: 1)  Premenstrual Dysphoric Syndrome  (ICD-625.4) 2)  Insomnia  (ICD-780.52) 3)  Phlebitis&thrombophleb Sup Vessels Lower Extrem  (ICD-451.0) 4)  Premenstrual Tension Syndromes  (ICD-625.4) 5)  Leg Pain, Left  (ICD-729.5) 6)  Attention Deficit Hyperactivity Disorder, Adult  (ICD-314.01) 7)  Closed Fracture of Patella  (ICD-822.0) 8)  Knee Pain  (ICD-719.46) 9)  Neck Pain  (ICD-723.1) 10)  Add  (ICD-314.00) 11)  Plantar Wart, Left  (ICD-078.12) 12)  Wrist Sprain, Left  (ICD-842.00) 13)  Abdominal Pain Right Lower Quadrant  (ICD-789.03) 14)  Ankle Sprain  (ICD-845.00) 15)  Muscle Spasm, Lumbar Region  (ICD-724.8) 16)  Chest Pain, Atypical  (ICD-786.59) 17)  Family History of Cad Female 1st Degree Relative <50  (ICD-V17.3) 18)  Depression  (ICD-311) 19)  Panic Disorder  (ICD-300.01) 20)  Bipolar Disorder Unspecified  (ICD-296.80) 21)  Allergic Rhinitis  (ICD-477.9) 22)  Anxiety   (ICD-300.00)  Current Problems (verified): 1)  Insomnia  (ICD-780.52) 2)  Phlebitis&thrombophleb Sup Vessels Lower Extrem  (ICD-451.0) 3)  Premenstrual Tension Syndromes  (ICD-625.4) 4)  Leg Pain, Left  (ICD-729.5) 5)  Attention Deficit Hyperactivity Disorder, Adult  (ICD-314.01) 6)  Closed Fracture of Patella  (ICD-822.0) 7)  Knee Pain  (ICD-719.46) 8)  Neck Pain  (ICD-723.1) 9)  Add  (ICD-314.00) 10)  Plantar Wart, Left  (ICD-078.12) 11)  Wrist Sprain, Left  (ICD-842.00) 12)  Abdominal Pain Right Lower Quadrant  (ICD-789.03) 13)  Ankle Sprain  (ICD-845.00) 14)  Muscle Spasm, Lumbar Region  (ICD-724.8) 15)  Chest Pain, Atypical  (ICD-786.59) 16)  Family History of Cad Female 1st Degree Relative <50  (ICD-V17.3) 17)  Depression  (ICD-311) 18)  Panic Disorder  (ICD-300.01) 19)  Bipolar Disorder Unspecified  (ICD-296.80) 20)  Allergic Rhinitis  (ICD-477.9) 21)  Anxiety  (ICD-300.00)  Medications Prior to Update: 1)  Aplenzin 348 Mg Xr24h-Tab (Bupropion Hbr) .... One By Mouth Daily 2)  Klonopin 1 Mg  Tabs (Clonazepam) .... Two Times A Day As Needed 3)  Lamictal 200 Mg Tabs (Lamotrigine) .[name].. 1 Tab Two Times A Day 4)  Ambien 5 Mg  Tabs (Zolpidem Tartrate) .... At Bedtime As Needed 5)  Vyvanse 70 Mg Caps (Lisdexamfetamine Dimesylate) .... One By Mouth Daily 6)  [employer] Wort 300 Mg Tabs ([address] Wort) .Marland KitchenMarland KitchenMarland Kitchen  For The Week Prior and Week During The Menses Take [employer] Wart 300 Mg Daily 7)  Methylphenidate Hcl 5 Mg Tabs (Methylphenidate Hcl) .... At 4pm 8)  Meloxicam 15 Mg Tabs (Meloxicam) .... One By Mouth Once Daily  Current Medications (verified): 1)  Aplenzin 348 Mg Xr24h-Tab (Bupropion Hbr) .... One By Mouth Daily 2)  Klonopin 1 Mg  Tabs (Clonazepam) .... Two Times A Day As Needed 3)  Lamictal 200 Mg Tabs (Lamotrigine) .[name].. 1 Tab Two Times A Day 4)  Ambien 5 Mg  Tabs (Zolpidem Tartrate) .... At Bedtime As Needed 5)  Vyvanse 70 Mg Caps (Lisdexamfetamine Dimesylate) .... One By  Mouth Daily 6)  Methylphenidate Hcl 5 Mg Tabs (Methylphenidate Hcl) .... At 4pm 7)  Meloxicam 15 Mg Tabs (Meloxicam) .... One By Mouth Once Daily 8)  Fluoxetine Hcl 20 Mg Tabs (Fluoxetine Hcl) .... One By Mouth Daily At Onset of Ovulation Through Menstral Period ( One A Day For Two Weeks Prior To The Menstrual Cycle)  Allergies (verified): No Known Drug Allergies  Past History:  Family History: Last updated: 01/10/2008 father  had stent Family History of CAD Female 1st degree relative <50 mother has ablation therapy  Social History: Last updated: 10/20/2009 Married  but separated Never Smoked 56 year old daughter lives with father now expecting first child  Risk Factors: Smoking Status: never (10/04/2010)  Past medical, surgical, family and social histories (including risk factors) reviewed, and no changes noted (except as noted below).  Past Medical History: Reviewed history from 01/10/2008 and no changes required. Anxiety Bipolar disorder Allergic rhinitis  Past Surgical History: Reviewed history from 01/10/2008 and no changes required. Denies surgical history  Family History: Reviewed history from 01/10/2008 and no changes required. father  had stent Family History of CAD Female 1st degree relative <50 mother has ablation therapy  Social History: Reviewed history from 10/20/2009 and no changes required. Married  but separated Never Smoked 54 year old daughter lives with father now expecting first child  Review of Systems  The patient denies anorexia, fever, weight loss, weight gain, vision loss, decreased hearing, hoarseness, chest pain, syncope, dyspnea on exertion, peripheral edema, prolonged cough, headaches, hemoptysis, abdominal pain, melena, hematochezia, severe indigestion/heartburn, hematuria, incontinence, genital sores, muscle weakness, suspicious skin lesions, transient blindness, difficulty walking, depression, unusual weight change, abnormal bleeding,  enlarged lymph nodes, angioedema, and breast masses.    Physical Exam  General:  Well-developed,well-nourished,in no acute distress; alert,appropriate and cooperative throughout examination Head:  Normocephalic and atraumatic without obvious abnormalities. No apparent alopecia or balding. Eyes:  pupils equal and pupils round.   Ears:  R ear normal and L ear normal.   Neck:  No deformities, masses, or tenderness noted. Lungs:  Normal respiratory effort, chest expands symmetrically. Lungs are clear to auscultation, no crackles or wheezes. Heart:  Normal rate and regular rhythm. S1 and S2 normal without gallop, murmur, click, rub or other extra sounds.   Impression & Recommendations:  Problem # 1:  PREMENSTRUAL DYSPHORIC SYNDROME (ICD-625.4) prozac for 2 weeks prior to menses Her updated medication list for this problem includes:    Meloxicam 15 Mg Tabs (Meloxicam) ..... One by mouth once daily  Headache diary reviewed.  Problem # 2:  ATTENTION DEFICIT HYPERACTIVITY DISORDER, ADULT (ICD-314.01)  Complete Medication List: 1)  Aplenzin 348 Mg Xr24h-tab (Bupropion hbr) .... One by mouth daily 2)  Klonopin 1 Mg Tabs (Clonazepam) .... Two times a day as needed 3)  Lamictal 200 Mg Tabs (Lamotrigine) .Marland KitchenMarland KitchenMarland Kitchen  1 tab two times a day 4)  Ambien 5 Mg Tabs (Zolpidem tartrate) .... At bedtime as needed 5)  Vyvanse 70 Mg Caps (Lisdexamfetamine dimesylate) .... One by mouth daily 6)  Methylphenidate Hcl 5 Mg Tabs (Methylphenidate hcl) .... At 4pm 7)  Meloxicam 15 Mg Tabs (Meloxicam) .... One by mouth once daily 8)  Fluoxetine Hcl 20 Mg Tabs (Fluoxetine hcl) .... One by mouth daily at onset of ovulation through menstral period ( one a day for two weeks prior to the menstrual cycle)  Patient Instructions: 1)  Please schedule a follow-up appointment in 3 months. Prescriptions: FLUOXETINE HCL 20 MG TABS (FLUOXETINE HCL) one by mouth daily at onset of ovulation through menstral period ( one a day for two  weeks prior to the menstrual cycle)  #14 x 11   Entered and Authorized by:   Stacie Glaze MD   Signed by:   Stacie Glaze MD on 10/04/2010   Method used:   Print then Mail to Patient   RxID:   [id number] METHYLPHENIDATE HCL 5 MG TABS (METHYLPHENIDATE HCL) at 4pm  #30 x 0   Entered by:   Willy Eddy, LPN   Authorized by:   Stacie Glaze MD   Signed by:   Willy Eddy, LPN on 14/78/2956   Method used:   Print then Mail to Patient   RxID:   [id number] VYVANSE 70 MG CAPS (LISDEXAMFETAMINE DIMESYLATE) one by mouth daily  #30 x 0   Entered by:   Willy Eddy, LPN   Authorized by:   Stacie Glaze MD   Signed by:   Willy Eddy, LPN on 28/41/3244   Method used:   Print then Mail to Patient   RxID:   [id number] METHYLPHENIDATE HCL 5 MG TABS (METHYLPHENIDATE HCL) at 4pm  #30 x 0   Entered by:   Willy Eddy, LPN   Authorized by:   Stacie Glaze MD   Signed by:   Willy Eddy, LPN on 42/59/5638   Method used:   Print then Mail to Patient   RxID:   [id number] VYVANSE 70 MG CAPS (LISDEXAMFETAMINE DIMESYLATE) one by mouth daily  #30 x 0   Entered by:   Willy Eddy, LPN   Authorized by:   Stacie Glaze MD   Signed by:   Willy Eddy, LPN on 05/27/1600   Method used:   Print then Mail to Patient   RxID:   [id number] METHYLPHENIDATE HCL 5 MG TABS (METHYLPHENIDATE HCL) at 4pm  #30 x 0   Entered by:   Willy Eddy, LPN   Authorized by:   Stacie Glaze MD   Signed by:   Willy Eddy, LPN on 70/62/3762   Method used:   Print then Mail to Patient   RxID:   [id number] VYVANSE 70 MG CAPS (LISDEXAMFETAMINE DIMESYLATE) one by mouth daily  #30 x 0   Entered by:   Willy Eddy, LPN   Authorized by:   Stacie Glaze MD   Signed by:   Willy Eddy, LPN on 26/94/8546   Method used:   Print then Mail to Patient   RxID:   [id number]    Orders Added: 1)  Est. Patient Level III [71696]

## 2010-12-30 NOTE — Assessment & Plan Note (Signed)
Summary: ?BRONCHITUS/CJR   Vital Signs:  Patient profile:   45 year old female Weight:      147 pounds O2 Sat:      97 % on Room air Temp:     98.1 degrees F oral Pulse rate:   72 / minute BP sitting:   102 / 74  (left arm) Cuff size:   regular  Vitals Entered By: Alfred Levins, CMA (November 24, 2010 3:51 PM)  O2 Flow:  Room air CC: dyspnea, mucus, cough, laryngitis, congestion x 2 days   History of Present Illness: onset on URI symptoms over one week ago. Nasal congestion and laryngitis for 3 days. No sore throat.  No headaches.  Occ cough. Mild malaise.  Took plain Tylenol.  Overall does not feel very ill.   Current Medications (verified): 1)  Aplenzin 348 Mg Xr24h-Tab (Bupropion Hbr) .... One By Mouth Daily 2)  Klonopin 1 Mg  Tabs (Clonazepam) .... Two Times A Day As Needed 3)  Lamictal 200 Mg Tabs (Lamotrigine) .[name].. 1 Tab Two Times A Day 4)  Ambien 5 Mg  Tabs (Zolpidem Tartrate) .... At Bedtime As Needed 5)  Vyvanse 70 Mg Caps (Lisdexamfetamine Dimesylate) .... One By Mouth Daily 6)  Methylphenidate Hcl 5 Mg Tabs (Methylphenidate Hcl) .... At 4pm 7)  Meloxicam 15 Mg Tabs (Meloxicam) .... One By Mouth Once Daily 8)  Fluoxetine Hcl 20 Mg Tabs (Fluoxetine Hcl) .... One By Mouth Daily At Onset of Ovulation Through Menstral Period ( One A Day For Two Weeks Prior To The Menstrual Cycle)  Allergies (verified): No Known Drug Allergies  Past History:  Past Medical History: Last updated: 01/10/2008 Anxiety Bipolar disorder Allergic rhinitis  Review of Systems  The patient denies fever, chest pain, dyspnea on exertion, prolonged cough, and headaches.    Physical Exam  General:  Well-developed,well-nourished,in no acute distress; alert,appropriate and cooperative throughout examination Ears:  External ear exam shows no significant lesions or deformities.  Otoscopic examination reveals clear canals, tympanic membranes are intact bilaterally without bulging, retraction,  inflammation or discharge. Hearing is grossly normal bilaterally. Nose:  External nasal examination shows no deformity or inflammation. Nasal mucosa are pink and moist without lesions or exudates. Mouth:  Oral mucosa and oropharynx without lesions or exudates.  Teeth in good repair. Neck:  No deformities, masses, or tenderness noted. Lungs:  Normal respiratory effort, chest expands symmetrically. Lungs are clear to auscultation, no crackles or wheezes. Heart:  Normal rate and regular rhythm. S1 and S2 normal without gallop, murmur, click, rub or other extra sounds.   Impression & Recommendations:  Problem # 1:  ACUTE LARYNGITIS, WITHOUT MENTION OF OBSTRUCTIO (ICD-464.00) suspect viral.  OTC mucinex and touch base if no better in one week.  Complete Medication List: 1)  Aplenzin 348 Mg Xr24h-tab (Bupropion hbr) .... One by mouth daily 2)  Klonopin 1 Mg Tabs (Clonazepam) .... Two times a day as needed 3)  Lamictal 200 Mg Tabs (Lamotrigine) .[name].. 1 tab two times a day 4)  Ambien 5 Mg Tabs (Zolpidem tartrate) .... At bedtime as needed 5)  Vyvanse 70 Mg Caps (Lisdexamfetamine dimesylate) .... One by mouth daily 6)  Methylphenidate Hcl 5 Mg Tabs (Methylphenidate hcl) .... At 4pm 7)  Meloxicam 15 Mg Tabs (Meloxicam) .... One by mouth once daily 8)  Fluoxetine Hcl 20 Mg Tabs (Fluoxetine hcl) .... One by mouth daily at onset of ovulation through menstral period ( one a day for two weeks prior to the menstrual cycle)  Patient Instructions: 1)  Get plenty of rest, drink lots of clear liquids, and use Tylenol or Ibuprofen for fever and comfort. Return in 7-10 days if you're not better: sooner if you'er feeling worse.  Prescriptions: MELOXICAM 15 MG TABS (MELOXICAM) one by mouth once daily  #30 x 1   Entered and Authorized by:   Evelena Peat MD   Signed by:   Evelena Peat MD on 11/24/2010   Method used:   [facility]        [facility] [address] [phone]* (retail)       [address]       [place], [place]  [address]       Ph: [phone] or [phone]       Fax: [phone]   RxID:   [id number]    Orders Added: 1)  Est. Patient Level III [36644]

## 2010-12-30 NOTE — Progress Notes (Signed)
Summary: Request for appt  Phone Note Call from Patient   Caller: Patient   [phone]   Reason for Call: Acute Illness Summary of Call: Pt called to adv that she has been exp increased anxiety and panic attacks.... Would like to come in to be evaluated asap.... Wants to know if she can be worked in to see Dr Lovell Sheehan... Offered OV with another physician but pt wants to make sure it would be ok with Dr Lovell Sheehan???????????   Pt can be reached at [phone].  Initial call taken by: Debbra Riding,  December 13, 2010 8:11 AM  Follow-up for Phone Call        ok with dr Lovell Sheehan for her to see another md Follow-up by: Willy Eddy, LPN,  December 13, 2010 8:50 AM  Additional Follow-up for Phone Call Additional follow up Details #1::        Pt advised - made appt with Dr Caryl Never for today.  Additional Follow-up by: Debbra Riding,  December 13, 2010 9:03 AM

## 2010-12-30 NOTE — Medication Information (Signed)
Summary: [facility]  [facility]   Imported By: Marily Memos 06/16/2010 10:16:03  _____________________________________________________________________  External Attachment:    Type:   Image     Comment:   External Document

## 2010-12-30 NOTE — Progress Notes (Signed)
Summary: Pt needs new ltr stating she can return to work 09/21/10  Phone Note Call from Patient Call back at Work Phone [phone] Call back at ask for Bryson Dames   Caller: Patient Summary of Call: Pt went back to work today and then she realized that the doctors note said to return on 09/23/10. Pt is req that another note be written stating that it is ok for pt to return to work today, 09/21/10.  Put start 09/17/10 through 09/20/10, so she could return on 09/21/10.  Pt needs this asap today. Pls fax to pts work #[phone] attn Bryson Dames.    Initial call taken by: Lucy Antigua,  September 21, 2010 9:05 AM  Follow-up for Phone Call        RTW note faxed, informed receptionist confirmation received, (pt in a staff meeting) Follow-up by: Sid Falcon LPN,  September 21, 2010 11:05 AM

## 2010-12-30 NOTE — Letter (Signed)
Summary: Out of Work  [facility] [employer]  [address]   [place], [place] [address]   Phone: [phone]  Fax: [phone]    September 21, 2010           Employee:  NAYOMI TABRON [facility] [employer] [facility]    To Whom It May Concern:   For Medical reasons, please excuse the above named employee from work for the following dates:  Start:   09/17/2010  End:   09/20/2010, our patient may return to work, regular duties on Tuesday, 09/21/2010  If you need additional information, please feel free to contact our office.         Sincerely,          Evelena Peat, MD

## 2010-12-30 NOTE — Letter (Signed)
Summary: [facility]  [facility]   Imported By: Maryln Gottron 12/09/2009 12:46:32  _____________________________________________________________________  External Attachment:    Type:   Image     Comment:   External Document

## 2010-12-30 NOTE — Miscellaneous (Signed)
Summary: Orders Update  Clinical Lists Changes  Orders: Added new Test order of Venous Duplex Lower Extremity (Venous Duplex Lower) - Signed Added new Referral order of Trans Esophageal Echocardiogram (TEE) - Signed

## 2010-12-30 NOTE — Progress Notes (Signed)
Summary: meds needed & asks about sub for one  Phone Note Call from Patient Call back at Home Phone [phone]   Reason for Call: Acute Illness, Refill Medication, Lab or Test Results Summary of Call: zOLPIDEM 5mg  one hs, clonzaepam 1.0mg  one as needed. Takes nights or weekends mostly & not taken since she started the ritalin.   Refills needed.  Generic ritalin methylin 10 mg 1/2 daily pm making her very tired, lit says may cause dizzines & headaches, & it's makingher fall asleep at work. Only change in meds & not helping extend Vyvanse effect.  Another med you could give her?  [facility] [place] [phone].  NKDA.   Initial call taken by: Rudy Jew, RN,  April 30, 2010 11:03 AM  Follow-up for Phone Call        per dr Lovell Sheehan- try taking the vyvanse later in the morning and stop the ritalin--the refill meds have been called in Follow-up by: Willy Eddy, LPN,  April 30, 4781 11:20 AM  Additional Follow-up for Phone Call Additional follow up Details #1::        Phone Call Completed Additional Follow-up by: Rudy Jew, RN,  April 30, 2010 12:14 PM    Prescriptions: AMBIEN 5 MG  TABS (ZOLPIDEM TARTRATE) at bedtime as needed  #30 x 1   Entered by:   Willy Eddy, LPN   Authorized by:   Stacie Glaze MD   Signed by:   Willy Eddy, LPN on 95/62/1308   Method used:   Telephoned to ...       [facility] [address] [phone]* (retail)       [address]       [place], [place]  [address]       Ph: [phone] or [phone]       Fax: [phone]   RxID:   [id number] KLONOPIN 1 MG  TABS (CLONAZEPAM) two times a day as needed  #60 x 1   Entered by:   Willy Eddy, LPN   Authorized by:   Stacie Glaze MD   Signed by:   Willy Eddy, LPN on 41/66/0630   Method used:   Telephoned to ...       [facility] [address] [phone]* (retail)       [address]       [place], [place]  [address]       Ph:  [phone] or [phone]       Fax: [phone]   RxID:   [id number]

## 2010-12-30 NOTE — Letter (Signed)
Summary: [facility]  [facility]   Imported By: Marily Memos 12/28/2009 09:05:17  _____________________________________________________________________  External Attachment:    Type:   Image     Comment:   External Document

## 2010-12-30 NOTE — Progress Notes (Signed)
Summary: Pt req script for Adderall 10mg   Phone Note Refill Request Call back at Home Phone [phone] Message from:  Patient on August 30, 2010 2:22 PM  Refills Requested: Medication #1:  ADDERALL 10 MG TABS one by mouth at 4 pm.   Dosage confirmed as above?Dosage Confirmed   Supply Requested: 1 month  Method Requested: Pick up at Office Initial call taken by: Lucy Antigua,  August 30, 2010 2:22 PM    Prescriptions: ADDERALL 10 MG TABS (AMPHETAMINE-DEXTROAMPHETAMINE) one by mouth at 4 pm  #30 x 0   Entered by:   Lynann Beaver CMA   Authorized by:   Stacie Glaze MD   Signed by:   Lynann Beaver CMA on 08/30/2010   Method used:   Print then Give to Patient   RxID:   [id number]  Notified pt.

## 2010-12-30 NOTE — Progress Notes (Signed)
Summary: Call A Nurse  Phone Note Call from Patient   Summary of Call: ov with dr burchette this       afternoon Initial call taken by: Willy Eddy, LPN,  December 13, 2010 9:05 AM     Call-A-Nurse Triage Call Report Triage Record Num: [id number] Operator: Jeraldine Loots Patient Name: [name] Call Date & Time: 12/12/2010 3:51:51PM Patient Phone: [phone] PCP: Darryll Capers Patient Gender: Female PCP Fax : [phone] Patient DOB: 1966/11/17 Practice Name: [facility] Reason for Call: Pt calling, having panic attacks but they are different from her usual ones. States that she hasn't skipped any meds. Hasn't been sleeping or eating good for a month. She is staying with a friend tonight. Home care instructions given. Protocol(s) Used: Anxiety: Panic Recommended Outcome per Protocol: Call Provider within 24 Hours Reason for Outcome: History of psychiatric or emotional disorder or hospitalization for same Care Advice:  ~ Call EMS 911 if patient develops mental status changes (such as confusion or lethargy).  ~ IMMEDIATE ACTION 12/12/2010 4:00:55PM Page 1 of 1 [email or web address]

## 2010-12-31 ENCOUNTER — Ambulatory Visit: Admit: 2010-12-31 | Payer: Self-pay | Admitting: Internal Medicine

## 2010-12-31 ENCOUNTER — Ambulatory Visit: Payer: Self-pay | Admitting: Internal Medicine

## 2011-01-05 NOTE — Progress Notes (Signed)
Summary: Pt has been in hospital re: bipolar episode  Phone Note Call from Patient Call back at [facility] Phone [phone]   Caller: Patient Summary of Call: Pt called and cx her ov for Fri 12/31/10. Pt has been in hospital, due to bipolar episode. Pt is going to be seeing Valinda Hoar at Dr [facility] office for the next couple of months.  Initial call taken by: Lucy Antigua,  December 27, 2010 1:26 PM  Follow-up for Phone Call        dr Lovell Sheehan is aware Follow-up by: Willy Eddy, LPN,  December 27, 2010 2:10 PM

## 2011-01-13 ENCOUNTER — Encounter: Payer: Self-pay | Admitting: Internal Medicine

## 2011-01-14 ENCOUNTER — Ambulatory Visit (INDEPENDENT_AMBULATORY_CARE_PROVIDER_SITE_OTHER): Payer: Managed Care, Other (non HMO) | Admitting: Internal Medicine

## 2011-01-14 ENCOUNTER — Encounter: Payer: Self-pay | Admitting: Internal Medicine

## 2011-01-14 DIAGNOSIS — F909 Attention-deficit hyperactivity disorder, unspecified type: Secondary | ICD-10-CM

## 2011-01-14 DIAGNOSIS — F988 Other specified behavioral and emotional disorders with onset usually occurring in childhood and adolescence: Secondary | ICD-10-CM

## 2011-01-14 DIAGNOSIS — F319 Bipolar disorder, unspecified: Secondary | ICD-10-CM

## 2011-01-14 DIAGNOSIS — N943 Premenstrual tension syndrome: Secondary | ICD-10-CM

## 2011-01-14 MED ORDER — METHYLPHENIDATE HCL 5 MG PO TABS: 5.0000 mg | ORAL_TABLET | Freq: Every day | ORAL | Status: DC

## 2011-01-14 MED ORDER — LISDEXAMFETAMINE DIMESYLATE 70 MG PO CAPS: 70.0000 mg | ORAL_CAPSULE | ORAL | Status: DC

## 2011-01-14 MED ORDER — FLUOXETINE HCL 20 MG PO TABS: 20.0000 mg | ORAL_TABLET | Freq: Every day | ORAL | Status: DC

## 2011-01-14 NOTE — Progress Notes (Signed)
Subjective:    Patient ID: [name], female    DOB: 11-14-1966, 45 y.o.   MRN: [id number]  HPI    patient is a 45 year old white female who presents for a routine physical examination for refill on her ADHD drugs she has been stable on the current medications she takes a long-acting ADHD medicine and a short acting in the late afternoon if needed for study and functioning.  She is also seen for bipolar disorder and sees a psychiatrist she's been stable on her current medicines for her bipolar meds at disorder.  She also has an acute problem of requiring sutures a laceration to her hand that was an accident at home she had this done at an urgent care and presents today for removal of the sutures.   Review of Systems  Constitutional: Negative for activity change, appetite change and fatigue.  HENT: Negative for ear pain, congestion, neck pain, postnasal drip and sinus pressure.   Eyes: Negative for redness and visual disturbance.  Respiratory: Negative for cough, shortness of breath and wheezing.   Gastrointestinal: Negative for abdominal pain and abdominal distention.  Genitourinary: Negative for dysuria, frequency and menstrual problem.  Musculoskeletal: Negative for myalgias, joint swelling and arthralgias.  Skin: Negative for rash and wound.  Neurological: Negative for dizziness, weakness and headaches.  Hematological: Negative for adenopathy. Does not bruise/bleed easily.  Psychiatric/Behavioral: Positive for decreased concentration. Negative for sleep disturbance. The patient is nervous/anxious and is hyperactive.        Past Medical History  Diagnosis Date  . PLANTAR WART, LEFT 12/29/2008  . BIPOLAR DISORDER UNSPECIFIED 01/10/2008  . ANXIETY 01/10/2008  . PANIC DISORDER 01/10/2008  . DEPRESSION 01/10/2008  . ATTENTION DEFICIT HYPERACTIVITY DISORDER, ADULT 10/28/2009  . PHLEBITIS\T\THROMBOPHLEB SUP VESSELS LOWER EXTREM 07/05/2010  . ALLERGIC RHINITIS 01/10/2008  . Premenstrual  tension syndromes 04/13/2010  . INSOMNIA 09/17/2010  . CHEST PAIN, ATYPICAL 01/10/2008   History reviewed. No pertinent past surgical history.  reports that she has quit smoking. She does not have any smokeless tobacco history on file. Her alcohol and drug histories not on file. family history includes Heart disease in her father and mother.     Objective:   Physical Exam  Constitutional: She is oriented to person, place, and time. She appears well-developed and well-nourished. No distress.  HENT:  Head: Normocephalic and atraumatic.  Right Ear: External ear normal.  Left Ear: External ear normal.  Nose: Nose normal.  Mouth/Throat: Oropharynx is clear and moist.  Eyes: Conjunctivae and EOM are normal. Pupils are equal, round, and reactive to light.  Neck: Normal range of motion. Neck supple. No JVD present. No tracheal deviation present. No thyromegaly present.  Cardiovascular: Normal rate, regular rhythm, normal heart sounds and intact distal pulses.   No murmur heard. Pulmonary/Chest: Effort normal and breath sounds normal. She has no wheezes. She exhibits no tenderness.  Abdominal: Soft. Bowel sounds are normal.  Musculoskeletal: Normal range of motion. She exhibits no edema and no tenderness.  Lymphadenopathy:    She has no cervical adenopathy.  Neurological: She is alert and oriented to person, place, and time. She has normal reflexes. No cranial nerve deficit.  Skin: Skin is warm and dry. She is not diaphoretic.  Psychiatric: She has a normal mood and affect. Her behavior is normal.          Assessment & Plan:   the patient presents for refill of her ADHD medication she is tolerance on her medic other medications and  compliant with her medicines.  She states that her functioning has improved well these medications and she feels that the dizziness these are necessary drugs.  She also is bipolar disorder we looked at the interaction between her ADHD medicines for bipolar  medicines and have discussed this with her psychiatrist.  She also presents today for removal of sutures done at an urgent care. refills of her medicines were produced as necessary for 90 days she will return for 90 days

## 2011-02-14 LAB — POCT HEMOGLOBIN-HEMACUE: Hemoglobin: 12.5 g/dL (ref 12.0–15.0)

## 2011-03-10 LAB — PREGNANCY, URINE: Preg Test, Ur: NEGATIVE

## 2011-03-10 LAB — HEMOGLOBIN AND HEMATOCRIT, BLOOD
HCT: 35.4 % — ABNORMAL LOW (ref 36.0–46.0)
Hemoglobin: 11.7 g/dL — ABNORMAL LOW (ref 12.0–15.0)

## 2011-03-17 ENCOUNTER — Other Ambulatory Visit: Payer: Self-pay

## 2011-03-18 ENCOUNTER — Encounter: Payer: Self-pay | Admitting: Internal Medicine

## 2011-03-18 ENCOUNTER — Ambulatory Visit (INDEPENDENT_AMBULATORY_CARE_PROVIDER_SITE_OTHER): Payer: Managed Care, Other (non HMO) | Admitting: Internal Medicine

## 2011-03-18 VITALS — HR 101 | Ht 67.0 in | Wt 147.0 lb

## 2011-03-18 DIAGNOSIS — F319 Bipolar disorder, unspecified: Secondary | ICD-10-CM

## 2011-03-18 MED ORDER — CLONAZEPAM 1 MG PO TABS: 1.0000 mg | ORAL_TABLET | Freq: Two times a day (BID) | ORAL | Status: DC | PRN

## 2011-03-18 MED ORDER — ZOLPIDEM TARTRATE 5 MG PO TABS: 5.0000 mg | ORAL_TABLET | Freq: Every evening | ORAL | Status: DC | PRN

## 2011-03-18 MED ORDER — BUPROPION HBR ER 348 MG PO TB24: 348.0000 mg | ORAL_TABLET | Freq: Every day | ORAL | Status: DC

## 2011-03-18 MED ORDER — LAMOTRIGINE 200 MG PO TABS: 200.0000 mg | ORAL_TABLET | Freq: Two times a day (BID) | ORAL | Status: DC

## 2011-03-19 ENCOUNTER — Encounter: Payer: Self-pay | Admitting: Internal Medicine

## 2011-03-19 NOTE — Progress Notes (Signed)
  Subjective:    Patient ID: [name], female    DOB: Nov 30, 1965, 45 y.o.   MRN: [id number]  HPI Pt presents to clinic for followup of bipolar disorder.Previously followed by psychiatry but has stopped seeing psychiatry because of financial concerns. Has been out of her medications for 2 weeks. States recently fired from her job and her short-term disability is expiring. With her bipolar being out of control she is not stable enough to return to work. Denies suicidal or homicidal ideation. Has followup with PMD in several weeks. Is concerned her health insurance is expiring as well. Has had increase in anxiety though no increase in agitation. No alleviating or exacerbating factors. Denies other complaint.  Reviewed past medical history, medications and allergies    Review of Systems  Psychiatric/Behavioral: Positive for behavioral problems, sleep disturbance and decreased concentration. Negative for suicidal ideas, confusion, self-injury and agitation. The patient is nervous/anxious.        Objective:   Physical Exam  Nursing note and vitals reviewed. Constitutional: She appears well-developed and well-nourished.  HENT:  Head: Normocephalic and atraumatic.  Right Ear: External ear normal.  Left Ear: External ear normal.  Nose: Nose normal.  Eyes: Conjunctivae are normal. No scleral icterus.  Neurological: She is alert.  Skin: Skin is warm and dry.  Psychiatric: She has a normal mood and affect. Her behavior is normal. Judgment and thought content normal.          Assessment & Plan:

## 2011-03-19 NOTE — Assessment & Plan Note (Signed)
Exacerbation of bipolar disorder. No current threat to self or others. Recommend extension of disability for one month ending primary care physician reevaluation Resume and refill medication.Followup if no improvement or worsening.

## 2011-03-21 ENCOUNTER — Telehealth: Payer: Self-pay | Admitting: Internal Medicine

## 2011-03-21 NOTE — Telephone Encounter (Signed)
Pt called and req that the disability form be placed on hold, until pt has a chance to get an ov with Dr Lovell Sheehan. Pt says that if form has already been sent, pls contact disability office and have them disregard until further notice.

## 2011-03-21 NOTE — Telephone Encounter (Signed)
On disability and no loner sees her psyche dr. However, her isurance will end this Friday and she would like to follow up with Dr Lovell Sheehan this week. Where to work her in?

## 2011-03-28 ENCOUNTER — Telehealth: Payer: Self-pay | Admitting: Internal Medicine

## 2011-03-28 NOTE — Telephone Encounter (Signed)
Per dr Lovell Sheehan- put on cancellation list and will make an appointment when someone cancels-pt aware

## 2011-03-28 NOTE — Telephone Encounter (Signed)
Pt is req work in appt to see Dr Lovell Sheehan today to discuss disablity claim. Pt says that this is a very urgent matter and needs to see Dr Lovell Sheehan today. Refuses to see another doctor.

## 2011-03-29 NOTE — Telephone Encounter (Signed)
Pt is aware of appt

## 2011-03-29 NOTE — Telephone Encounter (Signed)
Please call pt and put her in his Thursday May 3 at 11:30 pm ,but make it a 30 m inute appointment --thanks

## 2011-03-31 ENCOUNTER — Ambulatory Visit (INDEPENDENT_AMBULATORY_CARE_PROVIDER_SITE_OTHER): Payer: Managed Care, Other (non HMO) | Admitting: Internal Medicine

## 2011-03-31 ENCOUNTER — Encounter: Payer: Self-pay | Admitting: Internal Medicine

## 2011-03-31 VITALS — BP 130/70 | HR 72 | Resp 12

## 2011-03-31 DIAGNOSIS — F339 Major depressive disorder, recurrent, unspecified: Secondary | ICD-10-CM

## 2011-03-31 NOTE — Progress Notes (Signed)
  Subjective:    Patient ID: [name], female    DOB: Apr 26, 1966, 45 y.o.   MRN: [id number]  HPI patient is a 79 year white female with a history of bipolar disorder and panic anxiety and apparently had a psychotic break in January and went out on FMLA from her work as well as short-term disability.  She is now completed her FMLA and has been fired but he is still on short-term disability through July. Her psychiatrist is no longer filling on short-term disability papers she saw Dr. Rodena Medin last month who filled out her paperwork for one month.  We do not have the records from her therapist and/or her counselors to confirm the nature of her short-term disability.  We will obtain those records and she has an appointment for May 11 she has short-term disability through May 18. The    Review of Systems  Constitutional: Positive for activity change and appetite change.  Psychiatric/Behavioral: Positive for behavioral problems, dysphoric mood and agitation. The patient is nervous/anxious.    Past Medical History  Diagnosis Date  . PLANTAR WART, LEFT 12/29/2008  . BIPOLAR DISORDER UNSPECIFIED 01/10/2008  . ANXIETY 01/10/2008  . PANIC DISORDER 01/10/2008  . DEPRESSION 01/10/2008  . ATTENTION DEFICIT HYPERACTIVITY DISORDER, ADULT 10/28/2009  . PHLEBITIS\T\THROMBOPHLEB SUP VESSELS LOWER EXTREM 07/05/2010  . ALLERGIC RHINITIS 01/10/2008  . Premenstrual tension syndromes 04/13/2010  . INSOMNIA 09/17/2010  . CHEST PAIN, ATYPICAL 01/10/2008   No past surgical history on file.  reports that she has quit smoking. She does not have any smokeless tobacco history on file. Her alcohol and drug histories not on file. family history includes Heart disease in her father and mother. No Known Allergies     Objective:   Physical Exam  Constitutional: She is oriented to person, place, and time. She appears well-developed and well-nourished. No distress.  HENT:  Head: Normocephalic and atraumatic.  Right  Ear: External ear normal.  Left Ear: External ear normal.  Nose: Nose normal.  Mouth/Throat: Oropharynx is clear and moist.  Eyes: Conjunctivae and EOM are normal. Pupils are equal, round, and reactive to light.  Neck: Normal range of motion. Neck supple. No JVD present. No tracheal deviation present. No thyromegaly present.  Cardiovascular: Normal rate, regular rhythm, normal heart sounds and intact distal pulses.   No murmur heard. Pulmonary/Chest: Effort normal and breath sounds normal. She has no wheezes. She exhibits no tenderness.  Abdominal: Soft. Bowel sounds are normal.  Musculoskeletal: Normal range of motion. She exhibits no edema and no tenderness.  Lymphadenopathy:    She has no cervical adenopathy.  Neurological: She is alert and oriented to person, place, and time. She has normal reflexes. No cranial nerve deficit.  Skin: Skin is warm and dry. She is not diaphoretic.          Assessment & Plan:  We will obtain her records from her therapist and from her counselor to confirm her short-term disability and will assist her in completing her forms to July. Long-term disability for bipolar disease should be completed by psychiatry not I primary care therefore we will refer her back to her psychiatrist for long-term disability consideration. She has had an adjustment of her medications and continues therapy She is hopeful that her symptoms will resolve and desires to return to work if possible

## 2011-03-31 NOTE — Patient Instructions (Signed)
Keep the appointment on the 11th brain a copy of your counselors notes and/or your therapist's notes and a copy of the disability form filled out by her therapist.  These can be faxed to our office area code [phone]

## 2011-04-08 ENCOUNTER — Ambulatory Visit: Payer: Managed Care, Other (non HMO) | Admitting: Internal Medicine

## 2011-04-12 NOTE — Assessment & Plan Note (Signed)
[facility]                                 ON-CALL NOTE   ARIYANNAH, PAULING                        MRN:          [id number]  DATE:04/20/2008                            DOB:          Sep 03, 1966    Time is 8:30 pm, phone number is [phone].   The patient is calling from the emergency room after having just been  evaluated by the ER doctor for a pulled muscle on her lower right back,  which happened about 3 hours ago, playing tennis.  The pain is midway  between her spinal cord and her hip in the middle of the right lower  back.  She actually hurts more on her left leg than her right leg when  she moves.  She was diagnosed evidently with muscle strain and was told  to go home with Valium and Percocet.  She wants to know if that is what  she really got to do.  My opinion from listening to her symptoms and  what they have done, I would go on home.  However, I would ice the area  where she hurts 20 minutes at a time every hour today and tomorrow, take  the Valium, add Aleve two tablets after supper and after breakfast, use  heat first thing in the morning starting Tuesday morning, take the  Percocet as needed, and start gentle range of motion after Tuesday.  If  no better by say Thursday or Friday, then see Dr. Lovell Sheehan.   Primary care Thoma Paulsen is Dr. Lovell Sheehan and home office is Caffie Pinto, MD  Electronically Signed    RNS/MedQ  DD: 04/20/2008  DT: 04/21/2008  Job #: [id number]

## 2011-04-12 NOTE — Op Note (Signed)
[name], ZUPKO NO.:  [id number]   MEDICAL RECORD NO.:  [id number]          PATIENT TYPE:  AMB   LOCATION:  DAY                          FACILITY:  [facility]   PHYSICIAN:  Velora Heckler, MD      DATE OF BIRTH:  09/24/1966   DATE OF PROCEDURE:  02/19/2009  DATE OF DISCHARGE:                               OPERATIVE REPORT   PREOPERATIVE DIAGNOSIS:  Right inguinal hernia, reducible.   POSTOPERATIVE DIAGNOSIS:  Right inguinal hernia, reducible.   PROCEDURE:  Repair right inguinal hernia with Ethicon UltraPro mesh.   SURGEON:  Velora Heckler, MD, FACS   ANESTHESIA:  General per Dr. Eilene Ghazi.   ESTIMATED BLOOD LOSS:  Minimal.   PREPARATION:  ChloraPrep.   COMPLICATIONS:  None.   INDICATIONS:  The patient is a 45 year old white female from [place],  [place].  The patient had been diagnosed by Dr. Darryll Capers and  Dr. Malva Limes with right groin hernia.  The patient had no symptoms  of intestinal obstruction.  It was mildly symptomatic.  She now comes to  surgery for repair.   BODY OF REPORT:  Procedure was done in OR #11 at the [facility].  The patient was brought to the operating room,  placed in supine position on the operating room table.  Following  administration of general anesthesia, the patient is prepped and draped  in usual strict aseptic fashion.  After ascertaining that an adequate  level of anesthesia had been achieved, a right inguinal incision was  made a #15 blade.  Dissection was carried through subcutaneous tissues  and hemostasis obtained with the electrocautery.  External oblique  fascia was incised in line with its fibers where there is a visible  bulge.  Hernia was dissected out.  Round ligament was freed from the  mons pubis.  The hernia sac is mobilized and dissected down to the  fascial plane in the floor of the inguinal canal where there is  approximately a 5 mm defect.  It is reduced.  Hernia  was held in  reduction with a 2-0 silk figure-of-eight suture ligature.  The round  ligament and vasculature are transected between hemostats at the level  of the internal inguinal ring.  The stump is suture-ligated with a 2-0  silk suture.  It was then oversewn with a interrupted 2-0 silk suture.   Next the floor of the inguinal canal was recreated a sheet of Ethicon  Ultra Pro mesh.  Mesh was cut to the appropriate dimensions.  It is  secured to the pubic tubercle and along the inguinal ligament with a  running 2-0 Novofil suture.  Superior margin of the mesh was secured to  the transversalis and internal oblique fascia with interrupted 2-0  Novofil sutures.  Local field block is placed with Marcaine.  External  oblique fascia is closed over the mesh with interrupted 3-0 Vicryl  sutures.  Subcutaneous tissues were closed with interrupted 3-0 Vicryl  sutures.  Skin was anesthetized with local anesthetic.  Skin was closed with  running 4-0 Monocryl  subcuticular suture.  Wound is washed and dried and  Steri-Strips were applied.  Sterile dressings were applied.  The patient  is awakened from anesthesia and brought to the recovery room in stable  condition.  The patient tolerated the procedure well.      Velora Heckler, MD  Electronically Signed     TMG/MEDQ  D:  02/19/2009  T:  02/19/2009  Job:  [id number]   cc:   Stacie Glaze, MD  [address] [place]  [place] [address]   Malva Limes, M.D.  Fax: [phone]

## 2011-04-13 ENCOUNTER — Encounter: Payer: Self-pay | Admitting: Internal Medicine

## 2011-04-13 ENCOUNTER — Ambulatory Visit (INDEPENDENT_AMBULATORY_CARE_PROVIDER_SITE_OTHER): Payer: Managed Care, Other (non HMO) | Admitting: Internal Medicine

## 2011-04-13 VITALS — BP 110/70 | HR 72 | Temp 98.2°F | Resp 16 | Ht 71.0 in | Wt 142.0 lb

## 2011-04-13 DIAGNOSIS — T887XXA Unspecified adverse effect of drug or medicament, initial encounter: Secondary | ICD-10-CM

## 2011-04-13 DIAGNOSIS — F3111 Bipolar disorder, current episode manic without psychotic features, mild: Secondary | ICD-10-CM

## 2011-04-13 NOTE — Progress Notes (Signed)
  Subjective:    Patient ID: [name], female    DOB: 1965/12/30, 45 y.o.   MRN: [id number]  HPI Patient is seeing a therapist by the name of Glendell Docker. She has been treated in the past for adult attention deficit disorder in the setting of bipolar.  She recently had difficulty in her work situation due to her bipolar disorder anxiety and control of emotional outbursts.  She was placed on the leave of absence to [facility] [employer] and subsequently has required short-term disability to pursue therapeutic treatment for her disorder.  The issues include personality disorder as well as reactive stress. She brings with her  A plan of intensive outpatient counseling. 5 therapeutic session scheduled over the next 5 weeks. The therapist is willing to extend her short-term disability but if necessary we are also willing to document the necessity for this.  I am concerned that if she is not able to make therapeutic progress that long-term disability may also be a consideration.       Review of Systems  Constitutional: Negative for activity change, appetite change and fatigue.  HENT: Negative for ear pain, congestion, neck pain, postnasal drip and sinus pressure.   Eyes: Negative for redness and visual disturbance.  Respiratory: Negative for cough, shortness of breath and wheezing.   Gastrointestinal: Negative for abdominal pain and abdominal distention.  Genitourinary: Negative for dysuria, frequency and menstrual problem.  Musculoskeletal: Negative for myalgias, joint swelling and arthralgias.  Skin: Negative for rash and wound.  Neurological: Negative for dizziness, weakness and headaches.  Hematological: Negative for adenopathy. Does not bruise/bleed easily.  Psychiatric/Behavioral: Positive for decreased concentration and agitation. Negative for sleep disturbance. The patient is nervous/anxious and is hyperactive.        Objective:   Physical Exam  Constitutional: She is oriented to  person, place, and time. She appears well-developed and well-nourished. No distress.  HENT:  Head: Normocephalic and atraumatic.  Right Ear: External ear normal.  Left Ear: External ear normal.  Nose: Nose normal.  Mouth/Throat: Oropharynx is clear and moist.  Eyes: Conjunctivae and EOM are normal. Pupils are equal, round, and reactive to light.  Neck: Normal range of motion. Neck supple. No JVD present. No tracheal deviation present. No thyromegaly present.  Cardiovascular: Normal rate, regular rhythm, normal heart sounds and intact distal pulses.   No murmur heard. Pulmonary/Chest: Effort normal and breath sounds normal. She has no wheezes. She exhibits no tenderness.  Abdominal: Soft. Bowel sounds are normal.  Musculoskeletal: Normal range of motion. She exhibits no edema and no tenderness.  Lymphadenopathy:    She has no cervical adenopathy.  Neurological: She is alert and oriented to person, place, and time. She has normal reflexes. No cranial nerve deficit.  Skin: Skin is warm and dry. She is not diaphoretic.  Psychiatric: She is agitated, aggressive and is hyperactive. Cognition and memory are impaired. She expresses impulsivity and inappropriate judgment.          Assessment & Plan:  We plan to extend the disability until June 22 on her short-term disability to be able to assess the effectiveness of intensive therapy and to work with the therapist to decide long-term disability is indicated because of refractory bipolar disorder.  I have spent more than 30 minutes examining this patient face-to-face of which over half was spent in counseling

## 2011-04-14 ENCOUNTER — Ambulatory Visit: Payer: Managed Care, Other (non HMO) | Admitting: Internal Medicine

## 2011-05-11 ENCOUNTER — Telehealth: Payer: Self-pay | Admitting: Internal Medicine

## 2011-05-11 NOTE — Telephone Encounter (Signed)
Her disability ends this month and will need to see Dr Lovell Sheehan asap in whether or not she is able to return to work. Her therapist does not want to make that decision. She knows that a letter was sent to [place]. She just wants to see Dr Lovell Sheehan in person.

## 2011-05-11 NOTE — Telephone Encounter (Signed)
As we discussed-dr jenkins out of town the remainder of week- she can see him in the next 2 weeks or see a partner

## 2011-05-12 ENCOUNTER — Ambulatory Visit (INDEPENDENT_AMBULATORY_CARE_PROVIDER_SITE_OTHER): Payer: Managed Care, Other (non HMO) | Admitting: Internal Medicine

## 2011-05-12 ENCOUNTER — Encounter: Payer: Self-pay | Admitting: Internal Medicine

## 2011-05-12 VITALS — BP 110/74 | HR 83 | Wt 152.0 lb

## 2011-05-12 DIAGNOSIS — F319 Bipolar disorder, unspecified: Secondary | ICD-10-CM

## 2011-05-12 NOTE — Assessment & Plan Note (Signed)
Recommend and will schedule psychiatrist referral. Pt agreeable. Continue therapist visits. Extend disability through June 24, 2011 while attempting to re-establish with psychiatry.

## 2011-05-12 NOTE — Progress Notes (Signed)
  Subjective:    Patient ID: [name], female    DOB: 03/12/1966, 45 y.o.   MRN: [id number]  HPI Pt presents to clinic for followup of bipolar disorder. Continues to have difficulty with related anxiety and does not feel appropriate to return to work at this point. Disability is scheduled to end May 20, 2011. Currently off her medications as she stopped them due to side effects and feels overall improved without them. Has not seen psychiatry in some time and did not feel entirely comfortable with her previous provider. Does continue to follow with a therapist on a regular basis. No other current complaints.  Reviewed pmh, medications and allergies    Review of Systems  Psychiatric/Behavioral: Positive for dysphoric mood. Negative for suicidal ideas and self-injury. The patient is nervous/anxious.        Objective:   Physical Exam  Nursing note and vitals reviewed. Constitutional: She appears well-developed and well-nourished. No distress.  Neurological: She is alert.  Skin: She is not diaphoretic.  Psychiatric: She has a normal mood and affect.          Assessment & Plan:

## 2011-05-13 ENCOUNTER — Encounter: Payer: Self-pay | Admitting: Internal Medicine

## 2011-05-18 ENCOUNTER — Encounter: Payer: Self-pay | Admitting: Internal Medicine

## 2011-05-18 ENCOUNTER — Ambulatory Visit: Payer: Managed Care, Other (non HMO) | Admitting: Internal Medicine

## 2011-05-29 ENCOUNTER — Emergency Department (HOSPITAL_COMMUNITY)
Admission: EM | Admit: 2011-05-29 | Discharge: 2011-05-29 | Disposition: A | Discharge status: Home / Self Care | Payer: Medicaid Other | Attending: Emergency Medicine | Admitting: Emergency Medicine

## 2011-05-29 DIAGNOSIS — F309 Manic episode, unspecified: Secondary | ICD-10-CM | POA: Insufficient documentation

## 2011-05-29 DIAGNOSIS — G4700 Insomnia, unspecified: Secondary | ICD-10-CM | POA: Insufficient documentation

## 2011-05-31 ENCOUNTER — Emergency Department (HOSPITAL_COMMUNITY)
Admission: EM | Admit: 2011-05-31 | Discharge: 2011-06-01 | Disposition: A | Discharge status: Other Acute Inpatient Hospital | Payer: Medicaid Other | Attending: Emergency Medicine | Admitting: Emergency Medicine

## 2011-05-31 DIAGNOSIS — IMO0002 Reserved for concepts with insufficient information to code with codable children: Secondary | ICD-10-CM | POA: Insufficient documentation

## 2011-05-31 DIAGNOSIS — F319 Bipolar disorder, unspecified: Secondary | ICD-10-CM | POA: Insufficient documentation

## 2011-05-31 DIAGNOSIS — Z86718 Personal history of other venous thrombosis and embolism: Secondary | ICD-10-CM | POA: Insufficient documentation

## 2011-05-31 DIAGNOSIS — Z79899 Other long term (current) drug therapy: Secondary | ICD-10-CM | POA: Insufficient documentation

## 2011-05-31 DIAGNOSIS — F29 Unspecified psychosis not due to a substance or known physiological condition: Secondary | ICD-10-CM | POA: Insufficient documentation

## 2011-05-31 LAB — COMPREHENSIVE METABOLIC PANEL
Albumin: 4.2 g/dL (ref 3.5–5.2)
Alkaline Phosphatase: 37 U/L — ABNORMAL LOW (ref 39–117)
BUN: 12 mg/dL (ref 6–23)
Chloride: 97 mEq/L (ref 96–112)
Creatinine, Ser: 0.78 mg/dL (ref 0.50–1.10)
GFR calc Af Amer: >60 mL/min (ref >60)
GFR calc non Af Amer: >60 mL/min (ref >60)
Glucose, Bld: 232 mg/dL — ABNORMAL HIGH (ref 70–99)
Potassium: 2.9 mEq/L — ABNORMAL LOW (ref 3.5–5.1)
Total Bilirubin: 0.7 mg/dL (ref 0.3–1.2)

## 2011-05-31 LAB — CBC
Hemoglobin: 11.8 g/dL — ABNORMAL LOW (ref 12.0–15.0)
MCH: 27.4 pg (ref 26.0–34.0)
MCHC: 33.5 g/dL (ref 30.0–36.0)
MCV: 81.7 fL (ref 78.0–100.0)
RBC: 4.31 MIL/uL (ref 3.87–5.11)

## 2011-05-31 LAB — POCT PREGNANCY, URINE: Preg Test, Ur: NEGATIVE

## 2011-05-31 LAB — SALICYLATE LEVEL: Salicylate Lvl: 2.8 mg/dL (ref 2.8–20.0)

## 2011-05-31 LAB — RAPID URINE DRUG SCREEN, HOSP PERFORMED
Amphetamines: NOT DETECTED
Barbiturates: NOT DETECTED
Benzodiazepines: NOT DETECTED
Cocaine: NOT DETECTED
Opiates: NOT DETECTED
Tetrahydrocannabinol: NOT DETECTED

## 2011-05-31 LAB — ETHANOL: Alcohol, Ethyl (B): <11 mg/dL (ref 0–11)

## 2011-06-01 ENCOUNTER — Inpatient Hospital Stay (HOSPITAL_COMMUNITY)
Admission: AD | Admit: 2011-06-01 | Discharge: 2011-06-13 | DRG: 885 | Disposition: A | Discharge status: Home / Self Care | Payer: PRIVATE HEALTH INSURANCE | Source: Ambulatory Visit | Attending: Psychiatry | Admitting: Psychiatry

## 2011-06-01 DIAGNOSIS — E876 Hypokalemia: Secondary | ICD-10-CM

## 2011-06-01 DIAGNOSIS — Z9119 Patient's noncompliance with other medical treatment and regimen: Secondary | ICD-10-CM

## 2011-06-01 DIAGNOSIS — F319 Bipolar disorder, unspecified: Secondary | ICD-10-CM

## 2011-06-01 DIAGNOSIS — Z91199 Patient's noncompliance with other medical treatment and regimen due to unspecified reason: Secondary | ICD-10-CM

## 2011-06-01 DIAGNOSIS — F3110 Bipolar disorder, current episode manic without psychotic features, unspecified: Principal | ICD-10-CM

## 2011-06-01 LAB — BASIC METABOLIC PANEL
Calcium: 9.5 mg/dL (ref 8.4–10.5)
GFR calc Af Amer: >60 mL/min (ref >60)
GFR calc non Af Amer: >60 mL/min (ref >60)
Glucose, Bld: 89 mg/dL (ref 70–99)
Potassium: 3.9 mEq/L (ref 3.5–5.1)
Sodium: 137 mEq/L (ref 135–145)

## 2011-06-02 LAB — HEMOGLOBIN A1C
Hgb A1c MFr Bld: 6.1 % — ABNORMAL HIGH (ref <5.7)
Mean Plasma Glucose: 128 mg/dL — ABNORMAL HIGH (ref <117)

## 2011-06-08 NOTE — Assessment & Plan Note (Signed)
[name], [name] NO.:  [id number]  MEDICAL RECORD NO.:  [id number]  LOCATION:  0403                          FACILITY:  [facility]  PHYSICIAN:  Trisa Cranor T. Lelia Jons, M.D.   DATE OF BIRTH:  1966-09-29  DATE OF ADMISSION:  06/01/2011 DATE OF DISCHARGE:                      PSYCHIATRIC ADMISSION ASSESSMENT   HISTORY OF PRESENT ILLNESS:  The patient is a 45 year old Caucasian divorced female who was admitted under involuntary commitment after the patient became very manic, grandiose and noncompliant with the medication for past 2 months.  The patient is very agitated, irritable and easily distracted.  She is having flight of ideas.  She was seen by tel-psych who recommended inpatient treatment for stabilization.  In the unit, the patient is very uncooperative.  She reported, "don't ask too many questions."  She also told that she has been off the medication for past 4 months, believing that she does not need it.  She was difficult to recall the medication, but apparently, she was taking Lamictal and Klonopin.  The patient reported that she was seeing Dr. Nolen Mu, but that was 4 months ago.  She was prescribed medicine by Gaetana Michaelis who is a Publishing rights manager in that office.  She remembered that she has not been sleeping for past 1 week and having weird thinking in her mind.  She rambles all the time during the conversation and jumping from one topic to the other topic.  Most of the information was collected from the medical record and E-chart.  As per chart, the patient was also presented at [facility] on July 1 with the same presentation.  However, she was discharged as she does not meet the criteria for inpatient at that time.  PAST PSYCHIATRIC HISTORY:  As per chart, patient has at least one psychiatric admission in January 2012 at [facility].  She was presented with similar circumstances.  At that time, she was taking stimulants along  with Prozac.  She has a history of mania, but denies any history of any previous suicidal attempts.  ALCOHOL AND SUBSTANCE ABUSE HISTORY:  The patient denies any history of alcohol or any illegal substance.  SOCIAL HISTORY:  As per chart, patient is a 45 year old who is divorced. She lives by herself with a dog.  She has a 4 year old daughter who lives with her father.  She was employed in an assisted living. However, patient told that she got disability since she last released from the [facility].  MEDICAL DOCTOR:  Her primary care doctor is Dr. Darryll Capers.  However, the patient does not remember taking any medication from him.  She has been medically cleared from ER.  However, her potassium is low at 2.9.  FAMILY HISTORY:  As per chart.  There is no family history.  CURRENT MEDICATIONS:  None.  DRUG ALLERGIES:  NO KNOWN DRUG ALLERGIES.  LABORATORY FINDINGS:  Alcohol less than 5.  Drug screen negative.  CBC within normal limits except for mild low hemoglobin and hematocrit.  CMP shows mild elevation of glucose 232 and low potassium.  It is unclear when the blood was drawn if she was fasting.  MENTAL STATUS EXAM:  The  patient is very irritable and easily agitated. She maintained a limited eye contact.  She is uncooperative.  Her speech is rambling all the time.  Her thought processes were tangential and word salad.  She talks very fast, rapid with increased tone, though she denies any auditory hallucinations, suicidal thoughts or homicidal thoughts, but she had extreme mood lability and her thought content with grandiosity.  She described her mood as, "I am happy" and affect was labile.  She is easily distracted.  Her fund of knowledge was below average.  She is alert and oriented times 2.  Her insight, judgment, impulse control is poor.  DIAGNOSES:  AXIS I:  Bipolar disorder with recent manic episode. AXIS II:  Deferred. AXIS III:  No active medical  illness. AXIS IV:  Noncompliance with the medication, chronic psychiatric illness. AXIS V:  25-30.  PLAN:  We will admit the patient to the unit and provide safety check. We will restart the medication.  We will start Risperdal 0.5 mg twice a day.  For now, we will hold any antidepressant.  I will also repeat potassium and do a hemoglobin A1c.  We will increase collateral information.  She was encouraged to participate in group milieu therapy. She will need inpatient treatment and stabilization.  Length of stay 6-7 days.     Hollan Philipp T. Lolly Mustache, M.D.     STA/MEDQ  D:  06/01/2011  T:  06/01/2011  Job:  [id number]  Electronically Signed by Kathryne Sharper M.D. on 06/08/2011 02:18:58 PM

## 2011-06-14 ENCOUNTER — Ambulatory Visit: Payer: Managed Care, Other (non HMO) | Admitting: Internal Medicine

## 2011-06-14 ENCOUNTER — Encounter: Payer: Self-pay | Admitting: Internal Medicine

## 2011-06-21 NOTE — Discharge Summary (Signed)
[name], [name] NO.:  [id number]  MEDICAL RECORD NO.:  [id number]  LOCATION:  0403                          FACILITY:  [name]  PHYSICIAN:  Kristin Ditch, MD DATE OF BIRTH:  [birth year]  DATE OF ADMISSION:  06/01/2011 DATE OF DISCHARGE:  06/13/2011                              DISCHARGE SUMMARY   IDENTIFYING INFORMATION:  This is a 45 year old divorced Caucasian female.  This is an involuntary admission.  HISTORY OF PRESENT ILLNESS:  This was the second or third [name] admission for [name], who presents under an involuntary commitment after she presented voluntarily to the emergency room and then became agitated. Was having flight of ideas and she was seen by a tele-psychiatrist, who recommended inpatient treatment for stabilization.  She was initially agitated, irritable, and easily distracted.  She appeared quite manic with some grandiose themes to her thoughts and discussions.  She said that she had stop taking medications about 2 months ago and initially had done well, then began experiencing poor sleep.  This she was denying suicidal thoughts.  MEDICAL EVALUATION AND DIAGNOSTIC STUDIES:  She was medically evaluated in the [name].  Her alcohol screen was negative. Urine drug screen negative.  CBC normal except for a mildly decreased hemoglobin and hematocrit.  Admitting vital signs:  Temperature 97.8, pulse 131, respirations 22, blood pressure 156/93.  Drug allergies are none.  Her urine drug screen was negative for all substances. Salicylate level was negative.  Acetaminophen level was negative.  CBC revealed a hemoglobin of 11.8 and chemistry normal other than a decreased potassium of 2.9.  She received Ativan 1 mg in the emergency room, Geodon 10 mg in the emergency room, along with potassium chloride 40 mEq to replete her hypokalemia.  COURSE OF HOSPITALIZATION:  She was admitted to our acute stabilization unit and given a  provisional diagnosis of bipolar I disorder type 1, manic.  We put her in a room by herself with no roommate in order to provide her with a quiet environment and started her on Risperdal 1 mg p.o. b.i.d., and Ativan 1 mg q.6 hours p.r.n. for anxiety or agitation. She had not been taking any medications in the previous 4 months, but prior to that had been taking Aplenzin, lamotrigine, and Klonopin, along with Ambien at h.s.  We also added to her regimen Klonopin 0.5 mg b.i.d. p.r.n. for anxiety, and discontinued the Ativan.  By the 5th, she was able to talk about her stressors.  She had previously lost her job, was worried about finances, wanted to resume her career, then her family medical leave had run out and was worried about her short-term disability running out.  She was cooperative and directable on July 5, but was refusing to consider taking the Risperdal.  She had taken 1 dose, then was refusing.  She described how she recognized that she was getting more and more anxious and panicky prior to admission.  She stated she did not believe that she truly had bipolar disorder, but felt she was just under a lot of social stress, was not convinced that the medications would help her.  By July 9, she was less disorganized,  less irritable, but still did not want to consider taking the Risperdal.  She refused a family session and refused contact with mother, father, brother, sister, and friends.  Her participation at that point in group therapy was intermittent.  But she was cooperative and directable.  By the 10th, she agreed to take 1 mg of Risperdal at bedtime, did not want to take Seroquel, as in the past it had made her dizzy, and did not want to go back on Depakote, Geodon, or lithium.  She gave consent for [place] to speak with her daughter.  She vacillated somewhat on taking the Risperdal, but eventually did it and agreed to titration up to 2 mg p.o. q.h.s.  By June 12, 2011, she was  attending group consistently, taking the Risperdal with no side effects, generally feeling better with a brighter affect, fluent speech, mood and affect stable.  She was feeling good and asking for discharge.  DISCHARGE DIAGNOSES:  Axis I:  Bipolar disorder type 1, manic versus hypomanic, stabilized. Axis II:  No diagnosis. Axis III:  No diagnosis. Axis IV:  Significant issues with unemployment and financial pressures. Axis V:  Current 60, past year not known.  PLAN:  To follow up with [name] at [name] on Tuesday, June 14, 2011, at 10 a.m. and at [name] for medication management Wednesday, June 14, 2000, at 1 p.m.  DISCHARGE MEDICATIONS: 1. Benadryl 50 mg p.o. q.h.s. 2. Risperdal 2 mg p.o. q.h.s.     [name], N.P.   ______________________________ Kristin Ditch, MD    MAS/MEDQ  D:  06/13/2011  T:  06/13/2011  Job:  [id number]  Electronically Signed by [name] N.P. on 06/14/2011 11:26:50 AM Electronically Signed by [name]  on 06/21/2011 09:36:22 AM

## 2011-10-23 ENCOUNTER — Emergency Department (HOSPITAL_COMMUNITY): Payer: Medicaid Other

## 2011-10-23 ENCOUNTER — Encounter (HOSPITAL_COMMUNITY): Payer: Self-pay | Admitting: Emergency Medicine

## 2011-10-23 ENCOUNTER — Emergency Department (HOSPITAL_COMMUNITY)
Admission: EM | Admit: 2011-10-23 | Discharge: 2011-10-23 | Disposition: A | Discharge status: Home / Self Care | Payer: Medicaid Other | Attending: Emergency Medicine | Admitting: Emergency Medicine

## 2011-10-23 DIAGNOSIS — F909 Attention-deficit hyperactivity disorder, unspecified type: Secondary | ICD-10-CM | POA: Insufficient documentation

## 2011-10-23 DIAGNOSIS — F319 Bipolar disorder, unspecified: Secondary | ICD-10-CM

## 2011-10-23 DIAGNOSIS — R51 Headache: Secondary | ICD-10-CM | POA: Insufficient documentation

## 2011-10-23 DIAGNOSIS — R55 Syncope and collapse: Secondary | ICD-10-CM

## 2011-10-23 DIAGNOSIS — F411 Generalized anxiety disorder: Secondary | ICD-10-CM | POA: Insufficient documentation

## 2011-10-23 LAB — URINALYSIS, ROUTINE W REFLEX MICROSCOPIC
Bilirubin Urine: NEGATIVE
Glucose, UA: NEGATIVE mg/dL
Hgb urine dipstick: NEGATIVE
Ketones, ur: NEGATIVE mg/dL
Leukocytes, UA: NEGATIVE
Nitrite: NEGATIVE
Protein, ur: NEGATIVE mg/dL
Specific Gravity, Urine: 1.02 (ref 1.005–1.030)
Urobilinogen, UA: 0.2 mg/dL (ref 0.0–1.0)
pH: 5 (ref 5.0–8.0)

## 2011-10-23 LAB — RAPID URINE DRUG SCREEN, HOSP PERFORMED
Amphetamines: NOT DETECTED
Barbiturates: NOT DETECTED
Benzodiazepines: NOT DETECTED
Cocaine: NOT DETECTED
Opiates: NOT DETECTED
Tetrahydrocannabinol: NOT DETECTED

## 2011-10-23 LAB — BASIC METABOLIC PANEL
BUN: 16 mg/dL (ref 6–23)
CO2: 26 mEq/L (ref 19–32)
Calcium: 8.7 mg/dL (ref 8.4–10.5)
Chloride: 106 mEq/L (ref 96–112)
Creatinine, Ser: 0.82 mg/dL (ref 0.50–1.10)
GFR calc Af Amer: >90 mL/min (ref >90)
GFR calc non Af Amer: 84 mL/min — ABNORMAL LOW (ref >90)
Glucose, Bld: 111 mg/dL — ABNORMAL HIGH (ref 70–99)
Potassium: 3.9 mEq/L (ref 3.5–5.1)
Sodium: 139 mEq/L (ref 135–145)

## 2011-10-23 MED ORDER — LORAZEPAM 1 MG PO TABS
1.0000 mg | ORAL_TABLET | Freq: Once | ORAL | Status: AC
  Administered 2011-10-23: 1 mg via ORAL
  Filled 2011-10-23: qty 1

## 2011-10-23 MED ORDER — ACETAMINOPHEN 325 MG PO TABS
650.0000 mg | ORAL_TABLET | Freq: Once | ORAL | Status: AC
  Administered 2011-10-23: 650 mg via ORAL
  Filled 2011-10-23: qty 2

## 2011-10-23 NOTE — ED Notes (Signed)
Pt states she has not been taking her medicines lately and is bipolar.  States was playing tennis and became anixous, got down on the ground and started to scream.  States "i just get like that sometimes"

## 2011-10-23 NOTE — ED Provider Notes (Signed)
History     CSN: [id number] Arrival date & time: 10/23/2011  3:34 PM   First MD Initiated Contact with Patient 10/23/11 1538      Chief Complaint  Patient presents with  . Near Syncope    pt states she was playing tennis, states did not have a seizure but "got mad" and went to the floor and bystanders called ems. pt states she took herself off her meds and has a psychiatrist for anxiety    HPI: Patient is a 45 y.o. female presenting with syncope. The history is provided by the patient.  Loss of Consciousness This is a new problem. The current episode started today. The problem has been resolved. Associated symptoms include headaches. Pertinent negatives include no abdominal pain, chest pain, neck pain, visual change, vomiting or weakness. The symptoms are aggravated by exertion. She has tried nothing for the symptoms. The treatment provided significant relief.  Reports was playing tennis today when she became angry because she was not playing well. States she began to hold her breath in  anger and then believes that she fainted. States that she remembers hitting the ground. Denies loss of bowel or bladder. Witness present at bedside states the patient had altered mental status for approximately 10-15 minutes following event. States the patient was repeating words did not seem to remember the events and did not appear clearly oriented. Patient denies that she hit her head or has any other significant injuries. Patient admits to history of bipolar disorder. States that she has not been on medications for quite some time, as she does not believe she needs them. Currently patient appears quite manic with pressured speech flight of ideas and inability to stay focused in conversation. Patient initially refusing to have test done. But after speaking with patient and friend who also supports further workup, patient is agreeable.  Past Medical History  Diagnosis Date  . PLANTAR WART, LEFT 12/29/2008  .  BIPOLAR DISORDER UNSPECIFIED 01/10/2008  . ANXIETY 01/10/2008  . PANIC DISORDER 01/10/2008  . DEPRESSION 01/10/2008  . ATTENTION DEFICIT HYPERACTIVITY DISORDER, ADULT 10/28/2009  . PHLEBITIS\T\THROMBOPHLEB SUP VESSELS LOWER EXTREM 07/05/2010  . ALLERGIC RHINITIS 01/10/2008  . Premenstrual tension syndromes 04/13/2010  . INSOMNIA 09/17/2010  . CHEST PAIN, ATYPICAL 01/10/2008    History reviewed. No pertinent past surgical history.  Family History  Problem Relation Age of Onset  . Heart disease Mother   . Heart disease Father     History  Substance Use Topics  . Smoking status: Former Games developer  . Smokeless tobacco: Not on file  . Alcohol Use: No    OB History    Grav Para Term Preterm Abortions TAB SAB Ect Mult Living                  Review of Systems  Constitutional: Negative.   HENT: Negative for neck pain.   Eyes: Negative.   Respiratory: Negative.   Cardiovascular: Positive for syncope. Negative for chest pain.  Gastrointestinal: Negative.  Negative for vomiting and abdominal pain.  Genitourinary: Negative.   Musculoskeletal: Negative.   Skin: Negative.   Neurological: Positive for headaches. Negative for weakness.  Hematological: Negative.   Psychiatric/Behavioral: Negative.     Allergies  Review of patient's allergies indicates no known allergies.  Home Medications   Current Outpatient Rx  Name Route Sig Dispense Refill  . CLONAZEPAM 1 MG PO TABS Oral Take 0.5 mg by mouth daily as needed. For anxiety      BP  107/77  Pulse 76  Temp(Src) 98.1 F (36.7 C) (Oral)  SpO2 100%  Physical Exam  Constitutional: She is oriented to person, place, and time. She appears well-developed and well-nourished.  HENT:  Head: Normocephalic and atraumatic.  Eyes: Conjunctivae and EOM are normal. Pupils are equal, round, and reactive to light.  Neck: Normal range of motion. Neck supple.  Cardiovascular: Normal rate and regular rhythm.   Pulmonary/Chest: Effort normal and  breath sounds normal.  Abdominal: Soft. Bowel sounds are normal.  Musculoskeletal: Normal range of motion.  Neurological: She is alert and oriented to person, place, and time. She has normal reflexes.  Skin: Skin is warm and dry.  Psychiatric: Her mood appears anxious. Her speech is rapid and/or pressured. Cognition and memory are impaired. She expresses impulsivity.    ED Course  Procedures 10/23/2011  Date: 10/23/2011  Rate:77  Rhythm: normal sinus rhythm  QRS Axis: normal  Intervals: normal  ST/T Wave abnormalities: normal  Conduction Disutrbances:none  Narrative Interpretation: Aberrant complex, possibly supraventricular  Old EKG Reviewed: none available  EKG, CT head, Troponin and remaining labs are w/o acute findings. Pt much improved after Ativan. Admits she believes her episode today related to her lack of emotional control. States she and her psychiatrist have been attempting to manage her bi-polar disorder w/o medication as she does not like the way the meds make her feel. She admits she likely needs to re-visit this plan w/ her psychiatrist. Denies SI/HI. States she will contact him tomorrow at [facility] and arrange follow up for re-evaluation. Pt much more calm and appears to have insight regarding her disorder and need for re-eval by psychiatrist. Will plan for d/c home and encourage psych f/u as discussed. Pt agreeable w/ plan.   Labs Reviewed  URINALYSIS, ROUTINE W REFLEX MICROSCOPIC  URINE RAPID DRUG SCREEN (HOSP PERFORMED)  CBC  DIFFERENTIAL  BASIC METABOLIC PANEL  I-STAT TROPONIN I   No results found.   No diagnosis found.    MDM  HPI, findings and PE most c/w syncope. Labile emotions c/w pt's bi-polar disorder. (Act Team eval considered but after speaking w/ pt feel she is sufficiently competent to arrange f/u w/ psychiatrist. Do not feel pt is a danger to herself or others)        Leanne Chang, NP 10/28/11 [phone]

## 2011-10-23 NOTE — ED Notes (Signed)
Pt denies needs or c/o. At present. Ate all of supper and tolerated well.

## 2011-10-29 NOTE — ED Provider Notes (Signed)
Medical screening examination/treatment/procedure(s) were performed by non-physician practitioner and as supervising physician I was immediately available for consultation/collaboration.   Juliet Rude. Rubin Payor, MD 10/29/11 [phone]

## 2012-01-17 ENCOUNTER — Ambulatory Visit (INDEPENDENT_AMBULATORY_CARE_PROVIDER_SITE_OTHER): Payer: Medicaid Other | Admitting: Sports Medicine

## 2012-01-17 ENCOUNTER — Encounter: Payer: Self-pay | Admitting: Sports Medicine

## 2012-01-17 VITALS — BP 107/74 | HR 74 | Ht 66.75 in | Wt 155.0 lb

## 2012-01-17 DIAGNOSIS — M25519 Pain in unspecified shoulder: Secondary | ICD-10-CM

## 2012-01-17 DIAGNOSIS — M25512 Pain in left shoulder: Secondary | ICD-10-CM

## 2012-01-17 NOTE — Patient Instructions (Signed)
Perform these Light weight bilateral shoulder internal rotation Standing shoulder flies  45 degree flies  Avoid overhead shoulder movements and movements behind the plane of the body Follow up with [place] if your shoulder pain is not improved.

## 2012-01-17 NOTE — Assessment & Plan Note (Signed)
Exam is consistent with capsular pathology. Rotator cuff function seems relatively intact. Discussed secondary muscle strengthening including pectoris major. Discussed avoiding overhead as well as movements behind the body plan as to not exacerbate capsular weakness. Will followup if pain is not improved.

## 2012-01-17 NOTE — Progress Notes (Signed)
  Subjective:    Patient ID: [name], female    DOB: 09-17-1966, 46 y.o.   MRN: [id number]  HPI Patient presents today with chief complaint of left shoulder pain. Patient states that this pain initially started back in July of last year. Patient states that she was a patient in the emergency room and was manually held down noticed some mild discomfort in her left shoulder at time. Patient states that pain resolved within about week of the incident however returned upon patient beginning to exercise again. Pain has been slowly improving since July of last year however has still been persistent. Pain seems the most prominent with overhead movements such as a [facility] as well as doing the backstroke. Pain is relatively localized to the lateral aspect of the shoulder. Patient denies any numbness but does report some intermittent paresthesias in the arm. Patient denies any hand and distal arm weakness.   Review of Systems See HPI, otherwise ROS negative.    Objective:   Physical Exam Gen: in bed, NAD MSK:  Shoulder: Inspection reveals no abnormalities, atrophy or asymmetry. Palpation is normal with no tenderness over AC joint or bicipital groove. ROM is full in all planes. Rotator cuff strength normal throughout. No signs of impingement with negative Neer and Hawkin's tests, empty can. Speeds and Yergason's tests normal. No labral pathology noted with negative Obrien's, negative clunk and good stability. + augmented clunk test  +apprehension sign.  Normal scapular function observed. No painful arc and no drop arm sign.        Assessment & Plan:

## 2012-08-15 ENCOUNTER — Ambulatory Visit: Payer: Self-pay | Admitting: Internal Medicine

## 2013-01-22 ENCOUNTER — Encounter (HOSPITAL_COMMUNITY): Payer: Self-pay | Admitting: Psychiatry

## 2013-01-22 ENCOUNTER — Ambulatory Visit (INDEPENDENT_AMBULATORY_CARE_PROVIDER_SITE_OTHER): Payer: BC Managed Care – PPO | Admitting: Psychiatry

## 2013-01-22 VITALS — BP 120/78 | Ht 66.75 in | Wt 178.0 lb

## 2013-01-22 DIAGNOSIS — F3130 Bipolar disorder, current episode depressed, mild or moderate severity, unspecified: Secondary | ICD-10-CM

## 2013-01-22 DIAGNOSIS — F4310 Post-traumatic stress disorder, unspecified: Secondary | ICD-10-CM

## 2013-01-22 DIAGNOSIS — F411 Generalized anxiety disorder: Secondary | ICD-10-CM

## 2013-01-22 NOTE — Progress Notes (Signed)
Outpatient Psychiatry Initial Intake  01/22/2013  [name], a 47 y.o. female, for initial evaluation visit. Patient is referred by  Dr. Carolanne Grumbling.    HPI: The patient is a 47 year old female with a long-standing psychiatric history is transferring care from [facility] psychiatric services in [place] to [facility]. The patient is been in treatment there for approximately 2 years. The patient reports she first saw/psychiatrist 17 years ago and was diagnosed with bipolar disorder secondary to mood swings, manic behavior, and highs and lows. She has been on medication on and off throughout her life. She says over the past few years, and has moved from mood swings 2 generalized anxiety. The patient reports things began being difficult in January of 2012. She started having severe anxiety and took FMLA. She was actually hospitalized at [facility] for 2 days, but discharged for not participating in groups. The FMLA turned into short-term disability. The short-term disability turned into long-term disability, and her workplace no longer wanted her back. The patient reports at that summer in July of 2012, she was doing a lot of yard work. It was very hot summer and she was remaining dehydrated although she did not realize. She became overheated and week. She was living approximately one block from the hospital, she went to [facility] emergency department. She ended up in 4 point restraints while in the hospital. She became extremely paranoid she had feelings that she was going to be executed. She was seen by [facility] who recommended psychiatric hospitalization. She was at [facility] for 2 weeks. The patient was also hospitalized 17 years ago at [facility]. This was during her first manic episode. The patient has not been working since January 2012. She is now on permanent disability for the government. Her current medication is  only Klonopin 1 mg as needed for sleep or anxiety. She does not use this very often. She does see a chiropractor Dr. Margaretha Sheffield who prescribes supplements. The patient would like to be off all medication. The patient reports good sleep. She had been out of her Klonopin for several weeks and had slept well without it. It's hard for her to get up in the morning. She states she's lost sometimes as what to do now. She will stay in bed for approximately 14 hours. She gets up to walk her 76-year-old beagle named Sammy. The patient has been divorced for 6-7 years. She is now practicing lesbian. She had been married for 16 years. She has a daughter named Yvonna Alanis who is 20. She has a 77-year-old grandchild. Her daughter lives with her ex-husband. She and her ex-husband have been amenable relationship. The patient has had a "friend" for a year. She does not want the relationship to move past that, but her friend does. The patient is trying to focus more on herself and not worry about other people. The patient denies any recent manic episodes the last several years. She is incredibly anxious initially in the appointment, but becomes more relaxed. At this point she is not interested in any other medication. He denies any hallucinations. There've been no suicidal attempts. The patient does have a history of ADHD treated with Ritalin. She admits to poor focus most days.  Filed Vitals:   01/22/13 1327  BP: 120/78     Physical Illness:  Denies  Current Medications: Scheduled Meds: Klonopin 1 mg daily as needed Allergies: No Known Allergies  Stressors:  Anxiety regarding  hospitalization a year and half ago  History:   Past Psychiatric History:  Previous therapy: yes Previous psychiatric treatment and medication trials: yes - patient is non-med trials of Lamictal, trazodone, Wellbutrin, Ambien, Prozac which induced suicidality, Zoloft, and Ritalin. Previous psychiatric hospitalizations: yes - 3 hospitalizations. One  17 years ago, one 2 years ago, and one a year and a half ago all at [facility]. Previous diagnoses: yes - bipolar disorder, generalized anxiety disorder Previous suicide attempts: no History of violence: no Currently in treatment with patient [facility] The patient also attends groups there. She will try to get back in with Calton Dach who does EMDR..  Family Psychiatric History: Denies  Family Health History: Dad with stent placement  Personal and Social History: The patient lives in [place] with her dog a family. She has one daughter age 38 who lives with the patient's ex-husband. She has a granddaughter age 52. The patient was married for 17 years. She has been divorced for approximately 6 or 7. She is currently a lesbian. She is in a relationship, but does not consider it one.  Education: The patient has a degree from [employer] in recreational and leisure.  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   Review Of Systems:   Medical Review Of Systems: A comprehensive review of systems was negative.  Psychiatric Review Of Systems: Sleep: yes Appetite changes: yes Weight changes: yes, the patient reports being up approximately 30 pounds in the last year and a half Energy: yes Interest/pleasure/anhedonia: no Somatic symptoms: no Libido: yes Anxiety/panic: yes Guilty/hopeless: no Self-injurious behavior/risky behavior: no Any drugs: no Alcohol: no   Current Evaluation:    Mental Status Evaluation: Appearance:  age appropriate and casually dressed  Behavior:  normal  Speech:  normal pitch and normal volume  Mood:  anxious  Affect:  mood-congruent  Thought Process:  normal  Thought Content:  normal  Sensorium:  person, place, time/date and situation  Cognition:  grossly intact  Insight:  fair  Judgment:  fair        Assessment - Diagnosis - Goals:   Axis I: Generalized  Anxiety Disorder and Post Traumatic Stress Disorder Axis II: Deferred Axis III: Healthy Axis IV: other psychosocial or environmental problems Axis V: 51-60 moderate symptoms   Treatment Plan/Recommendations: At this point I will continue the Klonopin as needed. Patient has quantity sufficient at home and does not require prescriptions today. I will see her back in one month. Patient will bring supplements that she takes so that I may review them. Patient may call with concerns. No-show policy reviewed with patient.     Hamlin, Delaila Nand PATRICIA

## 2013-02-19 ENCOUNTER — Ambulatory Visit (HOSPITAL_COMMUNITY): Payer: Self-pay | Admitting: Psychiatry

## 2013-02-19 ENCOUNTER — Telehealth (HOSPITAL_COMMUNITY): Payer: Self-pay

## 2013-02-19 NOTE — Telephone Encounter (Signed)
Returned call.  Will see  Thursday.

## 2013-02-21 ENCOUNTER — Ambulatory Visit (INDEPENDENT_AMBULATORY_CARE_PROVIDER_SITE_OTHER): Payer: BC Managed Care – PPO | Admitting: Psychiatry

## 2013-02-21 ENCOUNTER — Encounter (HOSPITAL_COMMUNITY): Payer: Self-pay | Admitting: Psychiatry

## 2013-02-21 VITALS — BP 118/75 | Ht 66.75 in | Wt 176.0 lb

## 2013-02-21 DIAGNOSIS — F411 Generalized anxiety disorder: Secondary | ICD-10-CM

## 2013-02-21 NOTE — Progress Notes (Signed)
[facility] Follow-up Outpatient Visit  [name] [facility] January 13, 1966   Subjective: The patient is a 47 year old female who was seen for initial psychiatric evaluation in February of 2014. She does have a long-standing psychiatric history and has been hospitalized 3 times. She has been maintained on Klonopin as a monotherapy. She has been using supplements from her chiropractor Dr. Margaretha [name]. He has sent information on them, and I will have the information scanned She is not interested in psychiatric medications. She had planned on starting EMDR. Her therapist had contacted me wanted to discuss the situation prior to her initiating therapy. 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 The patient has a therapist who she sees monthly. She has been on multiple trials of medication in the past. She presents today. She has no motivation. She does report that she does everything she needs to do plus more. She does not feel like she has expressed any emotions for the past 2 years. She will cry but very rarely. She will not allow herself to get mad. She will make excuses for people. She is somewhat social. She spends a lot of time with family, and there is a couple she spends time with. She does not spend a lot of time with her daughter. She finds it depressing. Her daughter has one child and is now pregnant with another. The daughter's significant other is not very nice to her. The patient does have some plans for the future. At the workshop she is participating in tomorrow we [facility]. but discusses alternative treatments for depression and anxiety. In April she will attend 5 weeks of peer counseling. She believes subconsciously she's regulating her emotions. The patient continues to use Klonopin. She reports that in the last month she has used occiput 20. She will use half during the day, usually prior to visiting her daughter. She will take one occasionally at bedtime to help her sleep. There  is no suicidal thinking. Thoughts are making sense. She would like to hold off on starting any psychiatric medication to see if EMDR helps.  Filed Vitals:   02/21/13 1120  BP: 118/75   Active Ambulatory Problems    Diagnosis Date Noted  . PLANTAR WART, LEFT 12/29/2008  . BIPOLAR DISORDER UNSPECIFIED 01/10/2008  . ANXIETY 01/10/2008  . PANIC DISORDER 01/10/2008  . DEPRESSION 01/10/2008  . ATTENTION DEFICIT HYPERACTIVITY DISORDER, ADULT 10/28/2009  . ALLERGIC RHINITIS 01/10/2008  . Premenstrual tension syndromes 04/13/2010  . MUSCLE SPASM, LUMBAR REGION 04/22/2008  . INSOMNIA 09/17/2010  . CLOSED FRACTURE OF PATELLA 10/13/2009  . Shoulder pain, left 01/17/2012   Resolved Ambulatory Problems    Diagnosis Date Noted  . PHLEBITIS&THROMBOPHLEB SUP VESSELS LOWER EXTREM 07/05/2010  . KNEE PAIN 10/08/2009  . NECK PAIN 06/17/2009  . LEG PAIN, LEFT 01/29/2010  . CHEST PAIN, ATYPICAL 01/10/2008  . ABDOMINAL PAIN RIGHT LOWER QUADRANT 12/29/2008  . ANKLE SPRAIN 05/26/2008   No Additional Past Medical History   Current Outpatient Prescriptions on File Prior to Visit  Medication Sig Dispense Refill  . clonazePAM (KLONOPIN) 1 MG tablet Take 0.5 mg by mouth daily as needed. For anxiety       No current facility-administered medications on file prior to visit.   Review of Systems - History obtained from the patient General ROS: negative for - malaise, sleep disturbance, weight gain or weight loss Cardiovascular ROS: negative for - chest pain, palpitations, rapid heart rate or shortness of breath Neurological ROS:  negative for - behavioral changes, headaches, speech problems or visual changes   Mental Status Examination  Appearance: Casually dressed Alert: Yes Attention: good  Cooperative: Yes Eye Contact: Good Speech: Regular rate rhythm and volume Psychomotor Activity: Normal Memory/Concentration: Intact Oriented: person, place, time/date and situation Mood: Anxious Affect:  Congruent Thought Processes and Associations: Logical Fund of Knowledge: Fair Thought Content: No suicidal or homicidal thoughts Insight: Fair Judgement: Fair  Diagnosis: Generalized anxiety disorder, history of bipolar disorder  Treatment Plan: The patient has quantity sufficient of Klonopin. I have a call into her therapist and am waiting a call back. I will see her back in a month. Patient may call with concerns. If EMDR is unsuccessful, we will consider psychotropic medications.  Jamse Mead, MD

## 2013-03-01 ENCOUNTER — Telehealth (HOSPITAL_COMMUNITY): Payer: Self-pay

## 2013-03-01 NOTE — Telephone Encounter (Signed)
Spoke with therapist. Therapist has long history with patient. Therapist is hesitant to start EMDR without support. Therapist reports the best patient ever was was when she was treated with Lamictal. Therapist will start EMDR. If she requires hospitalization, so be it. I am seeing the patient on April 28. I will discuss Lamictal with her at that point.

## 2013-03-25 ENCOUNTER — Encounter (HOSPITAL_COMMUNITY): Payer: Self-pay | Admitting: Psychiatry

## 2013-03-25 ENCOUNTER — Ambulatory Visit (INDEPENDENT_AMBULATORY_CARE_PROVIDER_SITE_OTHER): Payer: BC Managed Care – PPO | Admitting: Psychiatry

## 2013-03-25 VITALS — BP 110/72 | Ht 66.75 in | Wt 177.0 lb

## 2013-03-25 DIAGNOSIS — F411 Generalized anxiety disorder: Secondary | ICD-10-CM

## 2013-03-25 DIAGNOSIS — F3130 Bipolar disorder, current episode depressed, mild or moderate severity, unspecified: Secondary | ICD-10-CM

## 2013-03-25 MED ORDER — LAMOTRIGINE 25 MG PO TABS: ORAL_TABLET | ORAL | Status: DC

## 2013-03-25 NOTE — Progress Notes (Signed)
   [facility] Follow-up Outpatient Visit  Rylah Fukuda [facility] 1966-11-10   Subjective: The patient is a 47 year old female who has been followed by [facility] since February of 2014. She does have a long-standing psychiatric history and has been hospitalized 3 times. She has been maintained on Klonopin as a monotherapy. She has been using supplements from her chiropractor Dr. Margaretha Sheffield. The patient carries diagnoses of bipolar disorder along with generalized anxiety disorder. At her last appointment, I continued her Klonopin. She continues to have quantity sufficient. She presents today. She has met with her therapist Corrie Dandy to, but has not started EMDR yet. She will see her tomorrow. She continues to take her supplements. She feels that she's been sad a lot. She wishes she could take her hospital experience away. She feels that when she was doing her best to [place] on a combination of Wellbutrin XL along with Lamictal. She is starting to think that she needs medication. She is not suicidal. Her anxiety is high whil in the office.  Filed Vitals:   03/25/13 1128  BP: 110/72   Active Ambulatory Problems    Diagnosis Date Noted  . PLANTAR WART, LEFT 12/29/2008  . BIPOLAR DISORDER UNSPECIFIED 01/10/2008  . ANXIETY 01/10/2008  . PANIC DISORDER 01/10/2008  . DEPRESSION 01/10/2008  . ATTENTION DEFICIT HYPERACTIVITY DISORDER, ADULT 10/28/2009  . ALLERGIC RHINITIS 01/10/2008  . Premenstrual tension syndromes 04/13/2010  . MUSCLE SPASM, LUMBAR REGION 04/22/2008  . INSOMNIA 09/17/2010  . CLOSED FRACTURE OF PATELLA 10/13/2009  . Shoulder pain, left 01/17/2012   Resolved Ambulatory Problems    Diagnosis Date Noted  . PHLEBITIS&THROMBOPHLEB SUP VESSELS LOWER EXTREM 07/05/2010  . KNEE PAIN 10/08/2009  . NECK PAIN 06/17/2009  . LEG PAIN, LEFT 01/29/2010  . CHEST PAIN, ATYPICAL 01/10/2008  . ABDOMINAL PAIN RIGHT LOWER QUADRANT 12/29/2008  . ANKLE SPRAIN 05/26/2008   No  Additional Past Medical History   Current Outpatient Prescriptions on File Prior to Visit  Medication Sig Dispense Refill  . clonazePAM (KLONOPIN) 1 MG tablet Take 0.5 mg by mouth daily as needed. For anxiety       No current facility-administered medications on file prior to visit.   Review of Systems - History obtained from the patient General ROS: negative for - malaise, sleep disturbance, weight gain or weight loss Cardiovascular ROS: negative for - chest pain, palpitations, rapid heart rate or shortness of breath Neurological ROS: negative for - behavioral changes, headaches, speech problems or visual changes   Mental Status Examination  Appearance: Casually dressed Alert: Yes Attention: good  Cooperative: Yes Eye Contact: Good Speech: Regular rate rhythm and volume Psychomotor Activity: Normal Memory/Concentration: Intact Oriented: person, place, time/date and situation Mood: Anxious Affect: Congruent Thought Processes and Associations: Logical Fund of Knowledge: Fair Thought Content: No suicidal or homicidal thoughts Insight: Fair Judgement: Fair  Diagnosis: Generalized anxiety disorder, history of bipolar disorder  Treatment Plan: I will start Lamictal 25 mg daily x2 weeks then increase to 50 mg daily. Risks, benefits, and side effects discussed with patient. I will see the patient back in one month. She may continue her home source of Klonopin. She may call with concerns. Jamse Mead, MD

## 2013-04-25 ENCOUNTER — Encounter (HOSPITAL_COMMUNITY): Payer: Self-pay | Admitting: Psychiatry

## 2013-04-25 ENCOUNTER — Ambulatory Visit (INDEPENDENT_AMBULATORY_CARE_PROVIDER_SITE_OTHER): Payer: BC Managed Care – PPO | Admitting: Psychiatry

## 2013-04-25 VITALS — BP 122/78 | Ht 66.75 in | Wt 174.0 lb

## 2013-04-25 DIAGNOSIS — F411 Generalized anxiety disorder: Secondary | ICD-10-CM

## 2013-04-25 DIAGNOSIS — F4310 Post-traumatic stress disorder, unspecified: Secondary | ICD-10-CM

## 2013-04-25 DIAGNOSIS — F3130 Bipolar disorder, current episode depressed, mild or moderate severity, unspecified: Secondary | ICD-10-CM

## 2013-04-25 MED ORDER — CLONAZEPAM 1 MG PO TABS: 0.5000 mg | ORAL_TABLET | Freq: Two times a day (BID) | ORAL | Status: DC | PRN

## 2013-04-25 NOTE — Progress Notes (Signed)
   [facility] Follow-up Outpatient Visit  Jacklin Zwick [facility] [birth year]   Subjective: The patient is a 47 year old female who has been followed by [facility] since February of 2014. She does have a long-standing psychiatric history and has been hospitalized 3 times. She has been maintained on Klonopin as a monotherapy. She has been using supplements from her chiropractor Dr. Margaretha Sheffield. The patient carries diagnoses of bipolar disorder along with posttraumatic stress disorder. She presents today. At her last appointment, I started her on Lamictal. She only took it for 4 days and stopped it. It coincided with her starting EMDR which did not want to start both at the same time. The patient had 3 or 4 sessions. She was reportedly is handling it well. Her next session with [name] is next Tuesday. The patient feels that she's processing things. It all relates to her most recent hospitalization. The patient has been exercising. She swims twice a week. She uses sleep is an escape. The patient has started with vocational rehabilitation. She is currently waiting on a job coach. I suggested to the patient that we hold off on treatment until her therapy course is completed. The patient had a panic attack in my office. I will continue to keep seeing her monthly.  Filed Vitals:   04/25/13 1122  BP: 122/78   Active Ambulatory Problems    Diagnosis Date Noted  . PLANTAR WART, LEFT 12/29/2008  . BIPOLAR DISORDER UNSPECIFIED 01/10/2008  . ANXIETY 01/10/2008  . PANIC DISORDER 01/10/2008  . DEPRESSION 01/10/2008  . ATTENTION DEFICIT HYPERACTIVITY DISORDER, ADULT 10/28/2009  . ALLERGIC RHINITIS 01/10/2008  . Premenstrual tension syndromes 04/13/2010  . MUSCLE SPASM, LUMBAR REGION 04/22/2008  . INSOMNIA 09/17/2010  . CLOSED FRACTURE OF PATELLA 10/13/2009  . Shoulder pain, left 01/17/2012   Resolved Ambulatory Problems    Diagnosis Date Noted  . PHLEBITIS&THROMBOPHLEB SUP VESSELS  LOWER EXTREM 07/05/2010  . KNEE PAIN 10/08/2009  . NECK PAIN 06/17/2009  . LEG PAIN, LEFT 01/29/2010  . CHEST PAIN, ATYPICAL 01/10/2008  . ABDOMINAL PAIN RIGHT LOWER QUADRANT 12/29/2008  . ANKLE SPRAIN 05/26/2008   No Additional Past Medical History   No current outpatient prescriptions on file prior to visit.   No current facility-administered medications on file prior to visit.   Review of Systems - General ROS: negative for - sleep disturbance or weight gain Psychological ROS: positive for - anxiety Cardiovascular ROS: no chest pain or dyspnea on exertion Musculoskeletal ROS: negative for - gait disturbance or muscular weakness Neurological ROS: negative for - dizziness, headaches or seizures    Mental Status Examination  Appearance: Casually dressed Alert: Yes Attention: good  Cooperative: Yes Eye Contact: Good Speech: Regular rate rhythm and volume Psychomotor Activity: Normal Memory/Concentration: Intact Oriented: person, place, time/date and situation Mood: Anxious Affect: Congruent Thought Processes and Associations: Logical Fund of Knowledge: Fair Thought Content: No suicidal or homicidal thoughts Insight: Fair Judgement: Fair  Diagnosis: Generalized anxiety disorder, history of bipolar disorder  Treatment Plan:  I will hold off on Lamictal. I will prescribe Klonopin which patient uses rarely. I will see her back in one month. Patient may call with concerns. Jamse Mead, MD

## 2013-05-15 ENCOUNTER — Telehealth (HOSPITAL_COMMUNITY): Payer: Self-pay

## 2013-05-15 NOTE — Telephone Encounter (Signed)
Returned call.  I will  write a letter to excuse for her jury duty on 721. Patient is back on Lamictal. No issues.

## 2013-05-15 NOTE — Telephone Encounter (Signed)
PT WANTS YOU TO WRITE HER A LETTER THAT SHE CAN'T DO JURY DUTY. EXPLAINED YOU DO NOT DO THIS. PT WOULD LIKE TO TALK TO YOU ANYWAY  ALSO WENT BACK ON LAMICTAL ON 13TH DOING WELL ON IT.

## 2013-06-05 ENCOUNTER — Ambulatory Visit (INDEPENDENT_AMBULATORY_CARE_PROVIDER_SITE_OTHER): Payer: BC Managed Care – PPO | Admitting: Psychiatry

## 2013-06-05 ENCOUNTER — Encounter (HOSPITAL_COMMUNITY): Payer: Self-pay | Admitting: Psychiatry

## 2013-06-05 VITALS — BP 102/64 | Ht 66.75 in | Wt 180.0 lb

## 2013-06-05 DIAGNOSIS — F3130 Bipolar disorder, current episode depressed, mild or moderate severity, unspecified: Secondary | ICD-10-CM

## 2013-06-05 DIAGNOSIS — F411 Generalized anxiety disorder: Secondary | ICD-10-CM

## 2013-06-05 DIAGNOSIS — F4310 Post-traumatic stress disorder, unspecified: Secondary | ICD-10-CM

## 2013-06-05 NOTE — Progress Notes (Signed)
[name] Follow-up Outpatient Visit  [name] 1966-03-29   Subjective: The patient is a 47 year old female who has been followed by [facility] since February of 2014. She does have a long-standing psychiatric history and has been hospitalized 3 times. She has been maintained on Klonopin as a monotherapy. She has been using supplements from her chiropractor Dr. Margaretha Sheffield. The patient carries diagnoses of bipolar disorder along with posttraumatic stress disorder. At her last appointment, I started her on Lamictal to help with mood. She stopped taking it after 4 days. She did call and 05/15/2013. She was looking for a letter to excuse her from jury duty. She also told the secretary at that time that she was back on the Lamictal. She presents today. She has continued with the Lamictal. She feels that it helps with her depression. She has been on 25 mg for 2-3 weeks. The patient is working right now and try not to get overwhelmed. She wasn't involving she has to do. She makes herself stop, and list one at a time. Yesterday the patient met with a functional medicine physician. They are looking at it better nutrition plan for her. She is hoping that with better nutrition, she may not have to take medication. She continues to Klonopin. She uses approximately 60 every 6 weeks. She just came back from 2 weeks at the beach. She found it relaxing. She did get out of jury duty. She's concerned because her daughter had DSS involved. Her daughter's baby's father called in on her saying she was neglecting the baby. They're consulting legal aid. The patient feels like her anxiety is better under control. She would like to continue to see me to monitor her mood. She does continue to see [name] for therapy.  Filed Vitals:   06/05/13 1039  BP: 102/64   Active Ambulatory Problems    Diagnosis Date Noted  . PLANTAR WART, LEFT 12/29/2008  . BIPOLAR DISORDER UNSPECIFIED 01/10/2008   . ANXIETY 01/10/2008  . PANIC DISORDER 01/10/2008  . DEPRESSION 01/10/2008  . ATTENTION DEFICIT HYPERACTIVITY DISORDER, ADULT 10/28/2009  . ALLERGIC RHINITIS 01/10/2008  . Premenstrual tension syndromes 04/13/2010  . MUSCLE SPASM, LUMBAR REGION 04/22/2008  . INSOMNIA 09/17/2010  . CLOSED FRACTURE OF PATELLA 10/13/2009  . Shoulder pain, left 01/17/2012   Resolved Ambulatory Problems    Diagnosis Date Noted  . PHLEBITIS&THROMBOPHLEB SUP VESSELS LOWER EXTREM 07/05/2010  . KNEE PAIN 10/08/2009  . NECK PAIN 06/17/2009  . LEG PAIN, LEFT 01/29/2010  . CHEST PAIN, ATYPICAL 01/10/2008  . ABDOMINAL PAIN RIGHT LOWER QUADRANT 12/29/2008  . ANKLE SPRAIN 05/26/2008   No Additional Past Medical History   Current Outpatient Prescriptions on File Prior to Visit  Medication Sig Dispense Refill  . clonazePAM (KLONOPIN) 1 MG tablet Take 0.5 tablets (0.5 mg total) by mouth 2 (two) times daily as needed. For anxiety  30 tablet  1   No current facility-administered medications on file prior to visit.   Review of Systems - General ROS: negative for - sleep disturbance or weight gain Psychological ROS: positive for - anxiety Cardiovascular ROS: no chest pain or dyspnea on exertion Musculoskeletal ROS: negative for - gait disturbance or muscular weakness Neurological ROS: negative for - dizziness, headaches or seizures    Mental Status Examination  Appearance: Casually dressed Alert: Yes Attention: good  Cooperative: Yes Eye Contact: Good Speech: Regular rate rhythm and volume Psychomotor Activity: Normal Memory/Concentration: Intact Oriented: person, place, time/date and situation Mood:  Anxious Affect: Congruent Thought Processes and Associations: Logical Fund of Knowledge: Fair Thought Content: No suicidal or homicidal thoughts Insight: Fair Judgement: Fair  Diagnosis: Generalized anxiety disorder, history of bipolar disorder  Treatment Plan:  I will continue the Lamictal at 25 mg  daily. Patient may take her Klonopin when necessary. I will see her back in one month. We will see how she is doing at that point. Patient may call with concerns. Jamse Mead, MD

## 2013-07-08 ENCOUNTER — Encounter (HOSPITAL_COMMUNITY): Payer: Self-pay | Admitting: Psychiatry

## 2013-07-08 ENCOUNTER — Ambulatory Visit (INDEPENDENT_AMBULATORY_CARE_PROVIDER_SITE_OTHER): Payer: BC Managed Care – PPO | Admitting: Psychiatry

## 2013-07-08 VITALS — BP 102/64 | Ht 66.75 in | Wt 178.0 lb

## 2013-07-08 DIAGNOSIS — F3130 Bipolar disorder, current episode depressed, mild or moderate severity, unspecified: Secondary | ICD-10-CM

## 2013-07-08 DIAGNOSIS — F4310 Post-traumatic stress disorder, unspecified: Secondary | ICD-10-CM

## 2013-07-08 DIAGNOSIS — F411 Generalized anxiety disorder: Secondary | ICD-10-CM

## 2013-07-08 MED ORDER — LAMOTRIGINE 25 MG PO TABS: 50.0000 mg | ORAL_TABLET | Freq: Every day | ORAL | Status: DC
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
## 2013-08-05 ENCOUNTER — Ambulatory Visit (HOSPITAL_COMMUNITY): Payer: Self-pay | Admitting: Psychiatry

## 2013-09-03 ENCOUNTER — Ambulatory Visit (INDEPENDENT_AMBULATORY_CARE_PROVIDER_SITE_OTHER): Payer: No Typology Code available for payment source | Admitting: Psychiatry

## 2013-09-03 ENCOUNTER — Encounter (HOSPITAL_COMMUNITY): Payer: Self-pay | Admitting: Psychiatry

## 2013-09-03 VITALS — BP 100/64 | Ht 66.75 in | Wt 178.0 lb

## 2013-09-03 DIAGNOSIS — F3130 Bipolar disorder, current episode depressed, mild or moderate severity, unspecified: Secondary | ICD-10-CM

## 2013-09-03 DIAGNOSIS — F4310 Post-traumatic stress disorder, unspecified: Secondary | ICD-10-CM

## 2013-09-03 DIAGNOSIS — F411 Generalized anxiety disorder: Secondary | ICD-10-CM

## 2013-09-03 MED ORDER — LAMOTRIGINE 100 MG PO TABS: 100.0000 mg | ORAL_TABLET | Freq: Every day | ORAL | Status: DC

## 2013-09-03 MED ORDER — CLONAZEPAM 1 MG PO TABS: 0.5000 mg | ORAL_TABLET | Freq: Two times a day (BID) | ORAL | Status: DC | PRN

## 2013-09-03 NOTE — Progress Notes (Signed)
   [facility]al [facility] Follow-up Outpatient Visit  [name] [facility] [birth year]   Subjective: The patient is a 47 year old female who has been followed by [facility] since February of 2014. She currently carries diagnoses of posttraumatic stress disorder along with a history of bipolar 1 disorder with no manic episodes and greater than 2 years. At her last appointment, I continued her Klonopin for anxiety as needed, and increased her Lamictal to 50 mg daily. She presents today. She has been using a lot of supplements. She has decided now to try the prescribed medication route. She is asking about restarting Wellbutrin XL which she has not been on in 2 years. We discussed the fact that Wellbutrin XLcan actually increased anxiety and anxious people. The patient reports that she's been social and getting out. She does play [place] in the community [place]anda. She has been working out. The patient's main concern today is that she has no excitement to complete things. She does not follow through with tasks. She blames the episode in the hospital for a decrease in self-confidence. She just doesn't feel the same. She's a motivational. There is no suicidal thoughts. She is having trouble finding her purpose in life. I discussed with her the fact that I will be leaving. She is willing to transfer care. The patient is sleeping and eating well. Her weight is the same as last appointment.  Filed Vitals:   09/03/13 1423  BP: 100/64   Active Ambulatory Problems    Diagnosis Date Noted  . PLANTAR WART, LEFT 12/29/2008  . BIPOLAR DISORDER UNSPECIFIED 01/10/2008  . ANXIETY 01/10/2008  . PANIC DISORDER 01/10/2008  . DEPRESSION 01/10/2008  . ATTENTION DEFICIT HYPERACTIVITY DISORDER, ADULT 10/28/2009  . ALLERGIC RHINITIS 01/10/2008  . Premenstrual tension syndromes 04/13/2010  . MUSCLE SPASM, LUMBAR REGION 04/22/2008  . INSOMNIA 09/17/2010  . CLOSED FRACTURE OF PATELLA 10/13/2009  . Shoulder  pain, left 01/17/2012   Resolved Ambulatory Problems    Diagnosis Date Noted  . PHLEBITIS&THROMBOPHLEB SUP VESSELS LOWER EXTREM 07/05/2010  . KNEE PAIN 10/08/2009  . NECK PAIN 06/17/2009  . LEG PAIN, LEFT 01/29/2010  . CHEST PAIN, ATYPICAL 01/10/2008  . ABDOMINAL PAIN RIGHT LOWER QUADRANT 12/29/2008  . ANKLE SPRAIN 05/26/2008   No Additional Past Medical History   No current outpatient prescriptions on file prior to visit.   No current facility-administered medications on file prior to visit.   Review of Systems - General ROS: negative for - sleep disturbance or weight gain Psychological ROS: positive for - anxiety Cardiovascular ROS: no chest pain or dyspnea on exertion Musculoskeletal ROS: negative for - gait disturbance or muscular weakness Neurological ROS: negative for - dizziness, headaches or seizures    Mental Status Examination  Appearance: Casually dressed Alert: Yes Attention: good  Cooperative: Yes Eye Contact: Good Speech: Regular rate rhythm and volume Psychomotor Activity: Normal Memory/Concentration: Intact Oriented: person, place, time/date and situation Mood: Anxious Affect: Congruent Thought Processes and Associations: Logical Fund of Knowledge: Fair Thought Content: No suicidal or homicidal thoughts Insight: Fair Judgement: Fair  Diagnosis: Generalized anxiety disorder, history of bipolar disorder  Treatment Plan:  I will increase the Lamictal to 100 mg daily. Patient may take her Klonopin when necessary. She'll be scheduled back in one month with Dr.Puthuvel.  Patient may call with concerns. Jamse Mead, MD

## 2013-10-11 ENCOUNTER — Ambulatory Visit (INDEPENDENT_AMBULATORY_CARE_PROVIDER_SITE_OTHER): Payer: No Typology Code available for payment source | Admitting: Psychiatry

## 2013-10-11 ENCOUNTER — Encounter (HOSPITAL_COMMUNITY): Payer: Self-pay | Admitting: Psychiatry

## 2013-10-11 VITALS — BP 114/70 | HR 75 | Ht 66.75 in | Wt 174.0 lb

## 2013-10-11 DIAGNOSIS — F319 Bipolar disorder, unspecified: Secondary | ICD-10-CM

## 2013-10-11 DIAGNOSIS — Z8659 Personal history of other mental and behavioral disorders: Secondary | ICD-10-CM

## 2013-10-11 DIAGNOSIS — F411 Generalized anxiety disorder: Secondary | ICD-10-CM

## 2013-10-11 DIAGNOSIS — F410 Panic disorder [episodic paroxysmal anxiety] without agoraphobia: Secondary | ICD-10-CM

## 2013-10-11 NOTE — Progress Notes (Signed)
[facility] [address] Progress Note  [name] [id number] 47 y.o.  10/11/2013 10:34 AM  Chief Complaint:   HPI Comments: [name] is  a  47 y/o female with a past psychiatric history significant for Generalized anxiety disorder, history of bipolar disorder. The patient is referred for psychiatric services for medication management.    . Location: The patient reports her moods and anxiety and depression. . Quality: In the area of affective symptoms, patient appears mildly anxious. Patient denies current suicidal ideation, intent, or plan. Patient denies current homicidal ideation, intent, or plan. Patient denies auditory hallucinations. Patient denies visual hallucinations. Patient denies symptoms of paranoia. Patient states sleep is good, with approximately 6-8 hours of sleep per night. Appetite is good. Energy level is good. Patient denies symptoms of anhedonia. Patient denies hopelessness, helplessness, or guilt.  . Severity: Depression: 6/10 (0=Very depressed; 5=Neutral; 10=Very Happy)  Anxiety- 5-6/10 (0=no anxiety; 5= moderate/tolerable anxiety; 10= panic attacks)   . Duration: Almost 30 years.  . Timing: Throughout the day.  . Context: With communicating with other people  . Modifying factors: New and unfamiliar situations  . Associated signs and symptoms: Denies any recent episodes consistent with mania, particularly decreased need for sleep with increased energy, grandiosity, impulsivity, hyperverbal and pressured speech, or increased productivity. Denies any recent symptoms consistent with psychosis, particularly auditory or visual hallucinations, thought broadcasting/insertion/withdrawal, or ideas of reference. Also denies excessive worry to the point of physical symptoms as well as any panic attacks. Denies any history of trauma or symptoms consistent with PTSD such as flashbacks, nightmares, hypervigilance, feelings of numbness or inability to connect  with others.    History of Present Illness: Suicidal Ideation: Negative Plan Formed: Negative Patient has means to carry out plan: Negative  Homicidal Ideation: Negative Plan Formed: Negative Patient has means to carry out plan: Negative  Review of Systems: Psychiatric: Agitation: Negative Hallucination: Negative Depressed Mood: Negative Insomnia: Negative Hypersomnia: Negative Altered Concentration: Negative Feels Worthless: Negative Grandiose Ideas: Negative Belief In Special Powers: Negative New/Increased Substance Abuse: Negative Compulsions: Negative  Neurologic: Headache: Negative Seizure: Negative Paresthesias: Negative  Past Medical Family, Social History:  Past Medical History  Diagnosis Date  . PLANTAR WART, LEFT 12/29/2008  . BIPOLAR DISORDER UNSPECIFIED 01/10/2008  . ANXIETY 01/10/2008  . PANIC DISORDER 01/10/2008  . DEPRESSION 01/10/2008  . ATTENTION DEFICIT HYPERACTIVITY DISORDER, ADULT 10/28/2009  . PHLEBITIS\T\THROMBOPHLEB SUP VESSELS LOWER EXTREM 07/05/2010  . ALLERGIC RHINITIS 01/10/2008  . Premenstrual tension syndromes 04/13/2010  . INSOMNIA 09/17/2010  . CHEST PAIN, ATYPICAL 01/10/2008   Family History  Problem Relation Age of Onset  . Heart disease Mother   . Heart disease Father    History   Social History  . Marital Status: Divorced    Spouse Name: N/A    Number of Children: N/A  . Years of Education: N/A   Social History Main Topics  . Smoking status: Never Smoker   . Smokeless tobacco: Never Used  . Alcohol Use: Yes     Comment: Rarely  . Drug Use: No  . Sexual Activity: Not Currently   Other Topics Concern  . None   Social History Narrative  . None      Outpatient Encounter Prescriptions as of 10/11/2013  Medication Sig  . clonazePAM (KLONOPIN) 1 MG tablet Take 0.5 tablets (0.5 mg total) by mouth 2 (two) times daily as needed. For anxiety  . lamoTRIgine (LAMICTAL) 100 MG tablet Take 1 tablet (100 mg total) by mouth daily.  Past Psychiatric History/Hospitalization(s): Anxiety: No Bipolar Disorder: Yes Depression: No Mania: No Psychosis: No Schizophrenia: No Personality Disorder: No Hospitalization for psychiatric illness: Yes History of Electroconvulsive Shock Therapy: No Prior Suicide Attempts: No  Physical Exam: Constitutional:  BP 114/70  Pulse 75  Ht 5' 6.75" (1.695 m)  Wt 174 lb (78.926 kg)  BMI 27.47 kg/m2  LMP 08/28/2013  General Appearance: alert, oriented, no acute distress and well nourished  Musculoskeletal: Strength & Muscle Tone: within normal limits Gait & Station: normal Patient leans: N/A  Psychiatric Specialty Exam: General Appearance: Casual and Fairly Groomed  Patent attorney::  Good  Speech:  Clear and Coherent and Normal Rate  Volume:  Normal  Mood:  "good" Depression: 6/10 (0=Very depressed; 5=Neutral; 10=Very Happy)  Anxiety- 5-6/10 (0=no anxiety; 5= moderate/tolerable anxiety; 10= panic attacks)   Affect:  Restricted  Thought Process:  Coherent, Linear and Logical  Orientation:  Full (Time, Place, and Person)  Thought Content:  WDL  Suicidal Thoughts:  No  Homicidal Thoughts:  No  Memory:  Immediate;   Good Recent;   Good Remote;   Good  Judgement:  Good  Insight:  Good  Psychomotor Activity:  Normal  Concentration:  Good  Recall:  Fair  Akathisia:  Negative  Handed:  Right  AIMS (if indicated):  Not indicated  Assets:  Communication Skills Desire for Improvement Financial Resources/Insurance Housing Intimacy  Sleep:  Number of Hours: 8-9     Medical Decision Making (Choose Three): Established Problem, Stable/Improving (1), Review of Psycho-Social Stressors (1) and Review of Medication Regimen & Side Effects (2)  Assessment: Axis I: Generalized Anxiety Disorder, History of Bipolar Disorder  Plan:   Plan of Care:  PLAN:  1. Affirm with the patient that the medications are taken as ordered. Patient  expressed understanding of how their  medications were to be used.    Laboratory:  No labs warranted at this time.    Psychotherapy: Therapy: brief supportive therapy provided.  Discussed psychosocial stressors in detail.    Medications:  Continue  the following psychiatric medications as written prior to this appointment/ with the following changes::  a) Lamictal 100 mg Daily b) Clonazepam 1 mg- half tablet as needed twice daily. -Risks and benefits, side effects and alternatives discussed with patient, she was given an opportunity to ask questions about his/her medication, illness, and treatment. All current psychiatric medications have been reviewed and discussed with the patient and adjusted as clinically appropriate. The patient has been provided an accurate and updated list of the medications being now prescribed.   Routine PRN Medications:  Negative  Consultations: The patient was encouraged to keep all PCP and specialty clinic appointments.   Safety Concerns:   Patient told to call clinic if any problems occur. Patient advised to go to  ER  if she should develop SI/HI, side effects, or if symptoms worsen. Has crisis numbers to call if needed.    Other:   8. Patient was instructed to return to clinic in 1 months.  9. The patient was advised to call and cancel their mental health appointment within 24 hours of the appointment, if they are unable to keep the appointment, as well as the three no show and termination from clinic policy. 10. The patient expressed understanding of the plan and agrees with the above.      Jacqulyn Cane, MD 10/11/2013

## 2013-10-11 NOTE — Addendum Note (Signed)
Addended by: Larena Sox on: 10/11/2013 09:05 PM   Modules accepted: Level of Service

## 2013-11-18 ENCOUNTER — Telehealth (HOSPITAL_COMMUNITY): Payer: Self-pay

## 2013-11-18 ENCOUNTER — Ambulatory Visit (HOSPITAL_COMMUNITY): Payer: Self-pay | Admitting: Psychiatry

## 2013-11-19 NOTE — Telephone Encounter (Signed)
[employer]. Left mesaage asking for an extension.

## 2013-11-19 NOTE — Telephone Encounter (Signed)
Called patient and insurance company. Asked for extension of paperwork due date and she had to be rescheduled to 12/04/2013.

## 2013-12-04 ENCOUNTER — Encounter (HOSPITAL_COMMUNITY): Payer: Self-pay | Admitting: Psychiatry

## 2013-12-04 ENCOUNTER — Encounter (INDEPENDENT_AMBULATORY_CARE_PROVIDER_SITE_OTHER): Payer: Self-pay

## 2013-12-04 ENCOUNTER — Ambulatory Visit (INDEPENDENT_AMBULATORY_CARE_PROVIDER_SITE_OTHER): Payer: No Typology Code available for payment source | Admitting: Psychiatry

## 2013-12-04 VITALS — BP 103/74 | HR 68 | Wt 180.0 lb

## 2013-12-04 DIAGNOSIS — F3130 Bipolar disorder, current episode depressed, mild or moderate severity, unspecified: Secondary | ICD-10-CM

## 2013-12-04 DIAGNOSIS — F411 Generalized anxiety disorder: Secondary | ICD-10-CM

## 2013-12-04 MED ORDER — LAMOTRIGINE 100 MG PO TABS: 200.0000 mg | ORAL_TABLET | Freq: Every day | ORAL | Status: DC

## 2013-12-04 MED ORDER — BUPROPION HCL 75 MG PO TABS: 75.0000 mg | ORAL_TABLET | ORAL | Status: DC

## 2013-12-04 NOTE — Progress Notes (Signed)
[facility]  [facility] Jun 05, 1966   Kathaleen MaserGail M Fischetti [id number] 48 y.o.  12/04/2013 10:06 AM  Chief Complaint:   HPI Comments: [name] is  a  48 y/o female with a past psychiatric history significant for Generalized anxiety disorder, history of bipolar disorder. The patient is referred for psychiatric services for medication management.    . Location: The patient reports that she has moods swings. She reports she has increased Lamictal to 200 mg daily.  . Quality: In the area of affective symptoms, patient appears mildly anxious. Patient denies current suicidal ideation, intent, or plan. Patient denies current homicidal ideation, intent, or plan. Patient denies auditory hallucinations. Patient denies visual hallucinations. Patient endorses symptoms of paranoia after her last hospitalization. Patient states sleep is good, with approximately 12 hours of sleep per night. Appetite is good. Energy level is good. Patient denies symptoms of anhedonia. Patient denies hopelessness, helplessness, or guilt.   . Severity: Depression: 5/10 (0=Very depressed; 5=Neutral; 10=Very Happy)  Anxiety- 5/10 (0=no anxiety; 5= moderate/tolerable anxiety; 10= panic attacks)   . Duration: Almost 30 years.  . Timing: Throughout the day.  . Context: With communicating with other people. New and unfamiliar situations. Relationship issues.   . Modifying factors: Patient reports she is learning to breath  . Associated signs and symptoms: Denies any recent episodes consistent with mania, particularly decreased need for sleep with increased energy, grandiosity, impulsivity, hyperverbal and pressured speech, or increased productivity. She was hospitalized in 2012 after she had taken herself off Welbutrin, lamictal, an antidepressant and a stimulant. Denies any recent symptoms consistent with psychosis, particularly auditory or visual hallucinations, thought  broadcasting/insertion/withdrawal, or ideas of reference. Also denies excessive worry to the point of physical symptoms as well as any panic attacks. Denies any history of trauma or symptoms consistent with PTSD such as flashbacks, nightmares, hypervigilance, feelings of numbness or inability to connect with others.    History of Present Illness: Suicidal Ideation: Negative Plan Formed: Negative Patient has means to carry out plan: Negative  Homicidal Ideation: Negative Plan Formed: Negative Patient has means to carry out plan: Negative  Review of Systems: Psychiatric: Agitation: Negative Hallucination: Negative Depressed Mood: Yes Insomnia: Negative Hypersomnia: Negative Altered Concentration: Negative Feels Worthless: Negative Grandiose Ideas: Negative Belief In Special Powers: Negative New/Increased Substance Abuse: Negative Compulsions: Negative  Neurologic: Headache: Negative Seizure: Negative Paresthesias: Negative  Past Medical Family, Social History:  Past Medical History  Diagnosis Date  . PLANTAR WART, LEFT 12/29/2008  . BIPOLAR DISORDER UNSPECIFIED 01/10/2008  . ANXIETY 01/10/2008  . PANIC DISORDER 01/10/2008  . DEPRESSION 01/10/2008  . ATTENTION DEFICIT HYPERACTIVITY DISORDER, ADULT 10/28/2009  . PHLEBITIS\T\THROMBOPHLEB SUP VESSELS LOWER EXTREM 07/05/2010  . ALLERGIC RHINITIS 01/10/2008  . Premenstrual tension syndromes 04/13/2010  . INSOMNIA 09/17/2010  . CHEST PAIN, ATYPICAL 01/10/2008   Family History  Problem Relation Age of Onset  . Heart disease Mother   . Heart disease Father    History   Social History  . Marital Status: Divorced    Spouse Name: N/A    Number of Children: N/A  . Years of Education: N/A   Social History Main Topics  . Smoking status: Never Smoker   . Smokeless tobacco: Never Used  . Alcohol Use: Yes     Comment: Rarely  . Drug Use: No  . Sexual Activity: Not Currently   Other Topics Concern  . Not on file   Social History  Narrative  . No  narrative on file      Outpatient Encounter Prescriptions as of 12/04/2013  Medication Sig  . clonazePAM (KLONOPIN) 1 MG tablet Take 0.5 tablets (0.5 mg total) by mouth 2 (two) times daily as needed. For anxiety  . lamoTRIgine (LAMICTAL) 100 MG tablet Take 1 tablet (100 mg total) by mouth daily.    Past Psychiatric History/Hospitalization(s): Anxiety: No Bipolar Disorder: Yes Depression: No Mania: No Psychosis: No Schizophrenia: No Personality Disorder: No Hospitalization for psychiatric illness: Yes History of Electroconvulsive Shock Therapy: No Prior Suicide Attempts: No  Physical Exam: Constitutional:  BP 103/74  Pulse 68  Wt 180 lb (81.647 kg)  LMP 11/13/2013 Review of Systems  Constitutional: Negative for fever, chills, weight loss and diaphoresis.  Respiratory: Negative for cough, hemoptysis, sputum production and shortness of breath.   Cardiovascular: Negative for chest pain, palpitations and orthopnea.  Gastrointestinal: Negative for heartburn, nausea, vomiting, abdominal pain, diarrhea and constipation.  Skin: Negative for itching and rash.  Neurological: Negative for dizziness, tingling, tremors, sensory change, speech change, seizures and loss of consciousness.    General Appearance: alert, oriented, no acute distress and well nourished  Musculoskeletal: Strength & Muscle Tone: within normal limits Gait & Station: normal Patient leans: N/A  Psychiatric Specialty Exam: General Appearance: Casual and Fairly Groomed  Eye Contact::  Good  Speech:  Clear and Coherent and Normal Rate  Volume:  Normal  Mood:  "good" Depression: 5/10 (0=Very depressed; 5=Neutral; 10=Very Happy)  Anxiety- 5/10 (0=no anxiety; 5= moderate/tolerable anxiety; 10= panic attacks)   Affect:  Restricted  Thought Process:  Coherent, Linear and Logical  Orientation:  Full (Time, Place, and Person)  Thought Content:  WDL  Suicidal Thoughts:  No  Homicidal Thoughts:  No   Memory:  Immediate;   Good Recent;   Good Remote;   Good  Judgement:  Good  Insight:  Good  Psychomotor Activity:  Normal  Concentration:  Good  Recall:  Fair  Akathisia:  Negative  Handed:  Right  AIMS (if indicated):  Not indicated  Assets:  Communication Skills Desire for Improvement Financial Resources/Insurance Housing Intimacy  Sleep:  Number of Hours: 8-9     Medical Decision Making (Choose Three): Established Problem, Stable/Improving (1), Review of Psycho-Social Stressors (1) and Review of Medication Regimen & Side Effects (2)  Assessment: Axis I: Generalized Anxiety Disorder, Bipolar I disorder, most recent episode (or current) depressed, unspecified   Plan:   Plan of Care:  PLAN:  1. Affirm with the patient that the medications are taken as ordered. Patient  expressed understanding of how their medications were to be used.    Laboratory:  No labs warranted at this time.    Psychotherapy: Therapy: brief supportive therapy provided.  Discussed psychosocial stressors in detail.    Medications:  Continue  the following psychiatric medications as written prior to this appointment with the following changes::  a) Lamictal 200 mg Daily B) Will try wellbutrin 75 mg b) Clonazepam 1 mg- half tablet as needed twice daily. Will attempt to wean patient off this medication in the long term. -Risks and benefits, side effects and alternatives discussed with patient, she was given an opportunity to ask questions about her medication, illness, and treatment. All current psychiatric medications have been reviewed and discussed with the patient and adjusted as clinically appropriate. The patient has been provided an accurate and updated list of the medications being now prescribed.   Routine PRN Medications:  Negative  Consultations: The patient was encouraged to keep  all PCP and specialty clinic appointments.   Safety Concerns:   Patient told to call clinic if any problems  occur. Patient advised to go to  ER  if she should develop SI/HI, side effects, or if symptoms worsen. Has crisis numbers to call if needed.    Other:   8. Patient was instructed to return to clinic in 1 months.  9. The patient was advised to call and cancel their mental health appointment within 24 hours of the appointment, if they are unable to keep the appointment, as well as the three no show and termination from clinic policy. 10. The patient expressed understanding of the plan and agrees with the above.  Time spent: 30 minutes  Jacqulyn Cane, M.D.  12/04/2013 10:08 AM

## 2013-12-26 ENCOUNTER — Telehealth (HOSPITAL_COMMUNITY): Payer: Self-pay | Admitting: Psychiatry

## 2013-12-29 NOTE — Telephone Encounter (Signed)
Called patient's insurance company. Informed them I would send in paper work.

## 2014-01-07 ENCOUNTER — Encounter (INDEPENDENT_AMBULATORY_CARE_PROVIDER_SITE_OTHER): Payer: Self-pay

## 2014-01-07 ENCOUNTER — Encounter (HOSPITAL_COMMUNITY): Payer: Self-pay | Admitting: Psychiatry

## 2014-01-07 ENCOUNTER — Ambulatory Visit (INDEPENDENT_AMBULATORY_CARE_PROVIDER_SITE_OTHER): Payer: No Typology Code available for payment source | Admitting: Psychiatry

## 2014-01-07 VITALS — BP 127/81 | HR 83 | Wt 178.0 lb

## 2014-01-07 DIAGNOSIS — F411 Generalized anxiety disorder: Secondary | ICD-10-CM

## 2014-01-07 DIAGNOSIS — F3130 Bipolar disorder, current episode depressed, mild or moderate severity, unspecified: Secondary | ICD-10-CM

## 2014-01-07 NOTE — Progress Notes (Signed)
[facility] Behavioral Health Follow-up Outpatient Visit  [name] [facility] [birth year] [id number] 48 y.o.  01/07/2014 9:55 AM  Chief Complaint:   HPI Comments: [name] is  a  48 y/o female with a past psychiatric history significant for Generalized Anxiety Disorder, Bipolar I disorder, most recent episode (or current) depressed, unspecified. The patient is referred for psychiatric services for medication management.    . Location: The patient reports she has increased Wellbutrin to 150 mg, and reports some improvement in mood.   . Quality: In the area of affective symptoms, patient appears mildly anxious. Patient denies current suicidal ideation, intent, or plan. Patient denies current homicidal ideation, intent, or plan. Patient denies auditory hallucinations. Patient denies visual hallucinations. Patient endorses symptoms of paranoia after her last hospitalization. Patient states sleep is fair, with approximately 8 hours of sleep per night. Appetite is good. Energy level is fair. Patient endorses some symptoms of anhedonia. Patient endorses periods or hopelessness, and guilt but guilt.   . Severity: Depression: 6/10 (0=Very depressed; 5=Neutral; 10=Very Happy)  Anxiety- 5/10 (0=no anxiety; 5= moderate/tolerable anxiety; 10= panic attacks)   . Duration: Almost 30 years.  . Timing: Throughout the day.  . Context: With communicating with other people. New and unfamiliar situations. Relationship issues. Concerns about her ability to maintain a job. Has been on disability since 2012.  Has a history of decompensation in 3 prior jobs due to manic episodes which required extended leave.  . Modifying factors: Patient reports she is learning to breath  . Associated signs and symptoms: Denies any recent episodes consistent with mania, particularly decreased need for sleep with increased energy, grandiosity, impulsivity, hyperverbal and pressured speech, or increased productivity. She was  hospitalized in 2012 after she had taken herself off Welbutrin, lamictal, an antidepressant and a stimulant. Denies any recent symptoms consistent with psychosis, particularly auditory or visual hallucinations, thought broadcasting/insertion/withdrawal, or ideas of reference. Also denies excessive worry to the point of physical symptoms as well as any panic attacks.Endorses a history of trauma or symptoms consistent with PTSD such as flashbacks, nightmares, hypervigilance, hyperstartle reflex.    History of Present Illness: Suicidal Ideation: Negative Plan Formed: Negative Patient has means to carry out plan: Negative  Homicidal Ideation: Negative Plan Formed: Negative Patient has means to carry out plan: Negative  Review of Systems: Psychiatric: Agitation: Negative Hallucination: Negative Depressed Mood: Yes Insomnia: Negative Hypersomnia: Negative Altered Concentration: Negative Feels Worthless: Negative Grandiose Ideas: Negative Belief In Special Powers: Negative New/Increased Substance Abuse: Negative Compulsions: Negative  Neurologic: Headache: Negative Seizure: Negative Paresthesias: Negative  Past Medical Family, Social History:  Past Medical History  Diagnosis Date  . PLANTAR WART, LEFT 12/29/2008  . BIPOLAR DISORDER UNSPECIFIED 01/10/2008  . ANXIETY 01/10/2008  . PANIC DISORDER 01/10/2008  . DEPRESSION 01/10/2008  . ATTENTION DEFICIT HYPERACTIVITY DISORDER, ADULT 10/28/2009  . PHLEBITIS\T\THROMBOPHLEB SUP VESSELS LOWER EXTREM 07/05/2010  . ALLERGIC RHINITIS 01/10/2008  . Premenstrual tension syndromes 04/13/2010  . INSOMNIA 09/17/2010  . CHEST PAIN, ATYPICAL 01/10/2008   Family History  Problem Relation Age of Onset  . Heart disease Mother   . Heart disease Father    History   Social History  . Marital Status: Divorced    Spouse Name: N/A    Number of Children: N/A  . Years of Education: N/A   Social History Main Topics  . Smoking status: Never Smoker   .  Smokeless tobacco: Never Used  . Alcohol Use: Yes     Comment: Rarely  .  Drug Use: No  . Sexual Activity: Not Currently   Other Topics Concern  . Not on file   Social History Narrative  . No narrative on file      Outpatient Encounter Prescriptions as of 01/07/2014  Medication Sig  . buPROPion (WELLBUTRIN) 75 MG tablet Take 1 tablet (75 mg total) by mouth every morning.  . clonazePAM (KLONOPIN) 1 MG tablet Take 0.5 mg by mouth as needed for anxiety. For anxiety  . lamoTRIgine (LAMICTAL) 100 MG tablet Take 2 tablets (200 mg total) by mouth daily.    Past Psychiatric History/Hospitalization(s): Anxiety: No Bipolar Disorder: Yes Depression: No Mania: No Psychosis: No Schizophrenia: No Personality Disorder: No Hospitalization for psychiatric illness: Yes History of Electroconvulsive Shock Therapy: No Prior Suicide Attempts: No  Physical Exam:  Constitutional:  BP 127/81  Pulse 83  Wt 178 lb (80.74 kg)  LMP 12/12/2013  Review of Systems  Constitutional: Negative for fever, chills, weight loss and diaphoresis.  Respiratory: Negative for cough, hemoptysis, sputum production and shortness of breath.   Cardiovascular: Negative for chest pain, palpitations and orthopnea.  Gastrointestinal: Negative for heartburn, nausea, vomiting, abdominal pain, diarrhea and constipation.  Skin: Negative for itching and rash.  Neurological: Negative for dizziness, tingling, tremors, sensory change, speech change, seizures and loss of consciousness.    General Appearance: alert, oriented, no acute distress and well nourished  Musculoskeletal: Strength & Muscle Tone: within normal limits Gait & Station: normal Patient leans: N/A  Psychiatric Specialty Exam: General Appearance: Casual and Fairly Groomed  Eye Contact::  Good  Speech:  Clear and Coherent and Normal Rate  Volume:  Normal  Mood:  "medioce"  Affect:  Restricted  Thought Process:  Coherent, Linear and Logical   Orientation:  Full (Time, Place, and Person)  Thought Content:  WDL  Suicidal Thoughts:  No  Homicidal Thoughts:  No  Memory:  Immediate;   Good Recent;   Good Remote;   Good  Judgement:  Good  Insight:  Good  Psychomotor Activity:  Normal  Concentration:  Good  Recall:  Fair  Akathisia:  Negative  Handed:  Right  AIMS (if indicated):  Not indicated  Assets:  Communication Skills Desire for Improvement Financial Resources/Insurance Housing Intimacy  Sleep:  Number of Hours: 8-9     Medical Decision Making (Choose Three): Established Problem, Stable/Improving (1), Review of Psycho-Social Stressors (1) and Review of Medication Regimen & Side Effects (2)  Assessment: Generalized Anxiety Disorder-stable,  Bipolar I disorder, most recent episode (or current) depressed, unspecified-stable  Axis I: Generalized Anxiety Disorder, Bipolar I disorder, most recent episode (or current) depressed, unspecified   Plan:   Plan of Care:  PLAN:  1. Affirm with the patient that the medications are taken as ordered. Patient  expressed understanding of how their medications were to be used.    Laboratory:  No labs warranted at this time.    Psychotherapy: Therapy: brief supportive therapy provided.  Discussed psychosocial stressors in detail.  More than 50% of the visit was spent on individual therapy/counseling.   Medications:  Continue  the following psychiatric medications as written prior to this appointment with the following changes::  a) Lamictal 200 mg Daily B) Increase wellbutrin XL 150 mg b) Clonazepam 1 mg- half tablet as needed twice daily. Will attempt to wean patient off this medication in the long term. -Risks and benefits, side effects and alternatives discussed with patient, she was given an opportunity to ask questions about her medication, illness, and treatment.  All current psychiatric medications have been reviewed and discussed with the patient and adjusted as  clinically appropriate. The patient has been provided an accurate and updated list of the medications being now prescribed.   Routine PRN Medications:  Negative  Consultations: The patient was encouraged to keep all PCP and specialty clinic appointments.   Safety Concerns:   Patient told to call clinic if any problems occur. Patient advised to go to  ER  if she should develop SI/HI, side effects, or if symptoms worsen. Has crisis numbers to call if needed.    Other:   8. Patient was instructed to return to clinic in 1 months.  9. The patient was advised to call and cancel their mental health appointment within 24 hours of the appointment, if they are unable to keep the appointment, as well as the three no show and termination from clinic policy. 10. The patient expressed understanding of the plan and agrees with the above.  Time spent: 30 minutes  Jacqulyn Cane, M.D.  01/07/2014 9:55 AM

## 2014-01-10 ENCOUNTER — Telehealth (HOSPITAL_COMMUNITY): Payer: Self-pay | Admitting: Psychiatry

## 2014-01-10 MED ORDER — BUPROPION HCL ER (XL) 150 MG PO TB24: 150.0000 mg | ORAL_TABLET | ORAL | Status: DC

## 2014-01-10 NOTE — Telephone Encounter (Signed)
Called patients, pharmacy. Will fill wellbutrin xl 150 mg. Called patient informed her of the same.

## 2014-02-01 ENCOUNTER — Other Ambulatory Visit (HOSPITAL_COMMUNITY): Payer: Self-pay | Admitting: Psychiatry

## 2014-02-02 NOTE — Telephone Encounter (Signed)
Refill request for lamictal approved.

## 2014-03-04 ENCOUNTER — Encounter (INDEPENDENT_AMBULATORY_CARE_PROVIDER_SITE_OTHER): Payer: Self-pay

## 2014-03-04 ENCOUNTER — Ambulatory Visit (INDEPENDENT_AMBULATORY_CARE_PROVIDER_SITE_OTHER): Payer: No Typology Code available for payment source | Admitting: Psychiatry

## 2014-03-04 ENCOUNTER — Encounter (HOSPITAL_COMMUNITY): Payer: Self-pay | Admitting: Psychiatry

## 2014-03-04 VITALS — BP 115/73 | HR 65 | Wt 178.0 lb

## 2014-03-04 DIAGNOSIS — F410 Panic disorder [episodic paroxysmal anxiety] without agoraphobia: Secondary | ICD-10-CM

## 2014-03-04 DIAGNOSIS — F411 Generalized anxiety disorder: Secondary | ICD-10-CM

## 2014-03-04 DIAGNOSIS — F3130 Bipolar disorder, current episode depressed, mild or moderate severity, unspecified: Secondary | ICD-10-CM

## 2014-03-04 DIAGNOSIS — F4310 Post-traumatic stress disorder, unspecified: Secondary | ICD-10-CM

## 2014-03-04 MED ORDER — BUPROPION HCL ER (XL) 150 MG PO TB24: 150.0000 mg | ORAL_TABLET | ORAL | Status: DC

## 2014-03-04 MED ORDER — LAMOTRIGINE 100 MG PO TABS: ORAL_TABLET | ORAL | Status: DC

## 2014-03-04 NOTE — Progress Notes (Signed)
[facility] Behavioral Health Follow-up Outpatient Visit  [name] [facility] [birth year] [id number] 47 y.o. 03/04/2014 10:48 AM  Chief Complaint:   HPI Comments: [name] is  a  48 y/o female with a past psychiatric history significant for Generalized Anxiety Disorder, Bipolar I disorder, most recent episode (or current) depressed, unspecified. The patient is referred for psychiatric services for medication management.    . Location: The patient reports some continued relationship stressors.   . Quality: The patient reports continued stressors related to a relationship she has ended several times. She states if she has to end the relationship one more time she will not go back. She reports she is taking her medications and denies any side effects.  In the area of affective symptoms, patient appears euthymic. Patient denies current suicidal ideation, intent, or plan. Patient denies current homicidal ideation, intent, or plan. Patient denies auditory hallucinations. Patient denies visual hallucinations. Patient endorses symptoms of paranoia after her last hospitalization. Patient states sleep is fair, with approximately 8 hours of sleep per night. Appetite is good. Energy level is fair. Patient endorses some symptoms of anhedonia. Patient endorses periods or hopelessness, and guilt but guilt.   . Severity: Depression: 6/10 (0=Very depressed; 5=Neutral; 10=Very Happy)  Anxiety- 5/10 (0=no anxiety; 5= moderate/tolerable anxiety; 10= panic attacks)   . Duration: Almost 30 years.  . Timing: Throughout the day.  . Context: With communicating with other people. New and unfamiliar situations. Relationship issues. Concerns about her ability to maintain a job. Has been on disability since 2012.  Has a history of decompensation in 3 prior jobs due to manic episodes which required extended leave.  . Modifying factors: Patient reports she is learning to breath  . Associated signs and symptoms: Denies  any recent episodes consistent with mania, particularly decreased need for sleep with increased energy, grandiosity, impulsivity, hyperverbal and pressured speech, or increased productivity. She was hospitalized in 2012 after she had taken herself off Welbutrin, lamictal, an antidepressant and a stimulant. Denies any recent symptoms consistent with psychosis, particularly auditory or visual hallucinations, thought broadcasting/insertion/withdrawal, or ideas of reference. Also denies excessive worry to the point of physical symptoms as well as any panic attacks.Endorses a history of trauma or symptoms consistent with PTSD such as flashbacks, nightmares, hypervigilance, hyperstartle reflex.    History of Present Illness: Suicidal Ideation: Negative Plan Formed: Negative Patient has means to carry out plan: Negative  Homicidal Ideation: Negative Plan Formed: Negative Patient has means to carry out plan: Negative  Review of Systems: Psychiatric: Agitation: Negative Hallucination: Negative Depressed Mood: Yes Insomnia: Negative Hypersomnia: Negative Altered Concentration: Negative Feels Worthless: Negative Grandiose Ideas: Negative Belief In Special Powers: Negative New/Increased Substance Abuse: Negative Compulsions: Negative  Neurologic: Headache: Negative Seizure: Negative Paresthesias: Negative  Past Medical Family, Social History:  Past Medical History  Diagnosis Date  . PLANTAR WART, LEFT 12/29/2008  . BIPOLAR DISORDER UNSPECIFIED 01/10/2008  . ANXIETY 01/10/2008  . PANIC DISORDER 01/10/2008  . DEPRESSION 01/10/2008  . ATTENTION DEFICIT HYPERACTIVITY DISORDER, ADULT 10/28/2009  . PHLEBITIS\T\THROMBOPHLEB SUP VESSELS LOWER EXTREM 07/05/2010  . ALLERGIC RHINITIS 01/10/2008  . Premenstrual tension syndromes 04/13/2010  . INSOMNIA 09/17/2010  . CHEST PAIN, ATYPICAL 01/10/2008   Family History  Problem Relation Age of Onset  . Heart disease Mother   . Heart disease Father    History    Social History  . Marital Status: Divorced    Spouse Name: N/A    Number of Children: N/A  . Years of Education:  N/A   Social History Main Topics  . Smoking status: Never Smoker   . Smokeless tobacco: Never Used  . Alcohol Use: 0.5 oz/week    1 drink(s) per week     Comment: Rarely  . Drug Use: No  . Sexual Activity: Yes    Partners: Female   Other Topics Concern  . Not on file   Social History Narrative  . No narrative on file      Outpatient Encounter Prescriptions as of 03/04/2014  Medication Sig  . buPROPion (WELLBUTRIN XL) 150 MG 24 hr tablet Take 1 tablet (150 mg total) by mouth every morning.  . clonazePAM (KLONOPIN) 1 MG tablet Take 0.5 mg by mouth as needed for anxiety. For anxiety  . lamoTRIgine (LAMICTAL) 100 MG tablet TAKE 2 TABLETS (200 MG TOTAL) BY MOUTH DAILY.    Past Psychiatric History/Hospitalization(s): Anxiety: No Bipolar Disorder: Yes Depression: No Mania: No Psychosis: No Schizophrenia: No Personality Disorder: No Hospitalization for psychiatric illness: Yes History of Electroconvulsive Shock Therapy: No Prior Suicide Attempts: No  Physical Exam:  Constitutional:  BP 115/73  Pulse 65  Wt 178 lb (80.74 kg)  Review of Systems  Constitutional: Negative for fever, chills, weight loss and diaphoresis.  Respiratory: Negative for cough, hemoptysis, sputum production and shortness of breath.   Cardiovascular: Negative for chest pain, palpitations and orthopnea.  Gastrointestinal: Negative for heartburn, nausea, vomiting, abdominal pain, diarrhea and constipation.  Skin: Negative for itching and rash.  Neurological: Negative for dizziness, tingling, tremors, sensory change, speech change, seizures and loss of consciousness.    General Appearance: alert, oriented, no acute distress and well nourished  Musculoskeletal: Strength & Muscle Tone: within normal limits Gait & Station: normal Patient leans: N/A  Psychiatric Specialty  Exam: General Appearance: Casual and Fairly Groomed  Eye Contact::  Good  Speech:  Clear and Coherent and Normal Rate  Volume:  Normal  Mood:  "fine"  Affect:  Restricted  Thought Process:  Coherent, Linear and Logical  Orientation:  Full (Time, Place, and Person)  Thought Content:  WDL  Suicidal Thoughts:  No  Homicidal Thoughts:  No  Memory:  Immediate;   Good Recent;   Good Remote;   Good  Judgement:  Good  Insight:  Good  Psychomotor Activity:  Normal  Concentration:  Good  Recall:  Fair  Akathisia:  Negative  Handed:  Right  AIMS (if indicated):  Not indicated  Assets:  Communication Skills Desire for Improvement Financial Resources/Insurance Housing Intimacy  Sleep:  Number of Hours: 8-9     Medical Decision Making (Choose Three): Established Problem, Stable/Improving (1), Review of Psycho-Social Stressors (1) and Review of Medication Regimen & Side Effects (2)  Assessment: Generalized Anxiety Disorder-stable,  Bipolar I disorder, most recent episode (or current) depressed, unspecified-stable  Axis I: Generalized Anxiety Disorder, Bipolar I disorder, most recent episode (or current) depressed, unspecified   Plan:   Plan of Care:  PLAN:  1. Affirm with the patient that the medications are taken as ordered. Patient  expressed understanding of how their medications were to be used.    Laboratory:  No labs warranted at this time.    Psychotherapy: Therapy: brief supportive therapy provided.  Discussed psychosocial stressors in detail.  More than 50% of the visit was spent on individual therapy/counseling.   Medications:  Continue  the following psychiatric medications as written prior to this appointment with the following changes::  a) Lamictal 200 mg Daily B) Increase wellbutrin XL 150 mg b)  Clonazepam 1 mg- half tablet as needed twice daily. Will not refill she has enough. -Risks and benefits, side effects and alternatives discussed with patient, she was  given an opportunity to ask questions about her medication, illness, and treatment. All current psychiatric medications have been reviewed and discussed with the patient and adjusted as clinically appropriate. The patient has been provided an accurate and updated list of the medications being now prescribed.   Routine PRN Medications:  Negative  Consultations: The patient was encouraged to keep all PCP and specialty clinic appointments.   Safety Concerns:   Patient told to call clinic if any problems occur. Patient advised to go to  ER  if she should develop SI/HI, side effects, or if symptoms worsen. Has crisis numbers to call if needed.    Other:   8. Patient was instructed to return to clinic in 1 months.  9. The patient was advised to call and cancel their mental health appointment within 24 hours of the appointment, if they are unable to keep the appointment, as well as the three no show and termination from clinic policy. 10. The patient expressed understanding of the plan and agrees with the above. 11. Patient informed that April 15th, 2015 would be my last day at this clinic.   Time spent: 30 minutes Jacqulyn CaneSHAJI Jearldine Cassady, M.D.  03/04/2014 10:48 AM

## 2014-04-22 DIAGNOSIS — F4310 Post-traumatic stress disorder, unspecified: Secondary | ICD-10-CM | POA: Insufficient documentation

## 2014-06-05 ENCOUNTER — Other Ambulatory Visit (HOSPITAL_COMMUNITY): Payer: Self-pay | Admitting: Psychiatry

## 2015-12-10 ENCOUNTER — Ambulatory Visit: Payer: Self-pay | Admitting: Adult Health

## 2015-12-24 ENCOUNTER — Ambulatory Visit: Payer: Self-pay | Admitting: Adult Health

## 2016-02-09 ENCOUNTER — Ambulatory Visit: Payer: Self-pay | Admitting: Adult Health

## 2016-04-11 ENCOUNTER — Ambulatory Visit: Payer: Self-pay | Admitting: Adult Health

## 2016-06-20 ENCOUNTER — Ambulatory Visit (INDEPENDENT_AMBULATORY_CARE_PROVIDER_SITE_OTHER): Payer: Medicare Other | Admitting: Family Medicine

## 2016-06-20 ENCOUNTER — Encounter: Payer: Self-pay | Admitting: Family Medicine

## 2016-06-20 VITALS — BP 100/68 | HR 88 | Temp 97.5°F | Ht 66.75 in | Wt 184.4 lb

## 2016-06-20 DIAGNOSIS — F319 Bipolar disorder, unspecified: Secondary | ICD-10-CM

## 2016-06-20 DIAGNOSIS — M7581 Other shoulder lesions, right shoulder: Secondary | ICD-10-CM

## 2016-06-20 DIAGNOSIS — F410 Panic disorder [episodic paroxysmal anxiety] without agoraphobia: Secondary | ICD-10-CM | POA: Diagnosis not present

## 2016-06-20 DIAGNOSIS — Z7689 Persons encountering health services in other specified circumstances: Secondary | ICD-10-CM

## 2016-06-20 DIAGNOSIS — F411 Generalized anxiety disorder: Secondary | ICD-10-CM | POA: Diagnosis not present

## 2016-06-20 DIAGNOSIS — Z7189 Other specified counseling: Secondary | ICD-10-CM
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
## 2016-06-20 NOTE — Progress Notes (Signed)
Pre visit review using our clinic review tool, if applicable. No additional management support is needed unless otherwise documented below in the visit note. 

## 2016-06-20 NOTE — Patient Instructions (Addendum)
BEFORE YOU LEAVE: -schedule a physical in 3 months; come fasting if possible (you can have water) -rotator cuff injury exercises -cologuard information  Call or let us know if you want [place] to order the cologuard.   We recommend the following healthy lifestyle: 1) Small portions - eat off of salad plate instead of dinner plate 2) Eat a healthy clean diet with avoidance of (less then 1 serving per week) processed foods, sweetened drinks, white starches, red meat, fast foods and sweets and consisting of: * 5-9 servings per day of fresh or frozen fruits and vegetables (not corn or potatoes, not dried or canned) *nuts and seeds, beans *olives and olive oil *small portions of lean meats such as fish and white chicken  *small portions of whole grains 3)Get at least 150 minutes of sweaty aerobic exercise per week 4)reduce stress - counseling, meditation, relaxation to balance other aspects of your life

## 2016-10-17 NOTE — Progress Notes (Signed)
HPI:  Here for CPE:  -Concerns and/or follow up today: none Sees psychiatrist, [name] for bipolar d/o, PTSD, Panic disorder. Had RTC injury last visit. Reports she has not done the exercises and continues to lift weights and occasionally feels a twinge here. She thinks she had a little flare of left sided plantar fasciitis. Had some pain in the plantar fascia area of her left foot at this now has resolved.  -Diet: variety of foods, balance and well rounded, larger portion sizes  -Exercise: regular exercise  -Taking folic acid, vitamin D or calcium: no  -Diabetes and Dyslipidemia Screening: Fasting for labs today  -Hx of HTN: no  -Vaccines: Declines vaccines, reports had very painful arm from Tdap and does not wish to do any vaccines  -pap history: sees Dr. Dareen PianoAnderson for yearly exams  -FDLMP: n/a  -sexual activity: yes, female partner, no new partners  -wants STI testing (Hep C if born [id number]): no  -FH breast, colon or ovarian ca: see FH Last mammogram: sees gyn Last colon cancer screening: agrees to cologuard today Breast Ca Risk Assessment: -sees gyn  -Alcohol, Tobacco, drug use: see social history  Review of Systems - no fevers, unintentional weight loss, vision loss, hearing loss, chest pain, sob, hemoptysis, melena, hematochezia, hematuria, genital discharge, changing or concerning skin lesions, bleeding, bruising, loc, thoughts of self harm or SI  Past Medical History:  Diagnosis Date  . ALLERGIC RHINITIS 01/10/2008  . ANXIETY 01/10/2008  . ATTENTION DEFICIT HYPERACTIVITY DISORDER, ADULT 10/28/2009  . BIPOLAR DISORDER UNSPECIFIED 01/10/2008  . CHEST PAIN, ATYPICAL 01/10/2008  . DEPRESSION 01/10/2008  . INSOMNIA 09/17/2010  . PANIC DISORDER 01/10/2008  . PHLEBITIS\T\THROMBOPHLEB SUP VESSELS LOWER EXTREM 07/05/2010  . PLANTAR WART, LEFT 12/29/2008  . Sebaceous cyst    scalp, seeing general surgeon    Past Surgical History:  Procedure Laterality Date  .  inguinal hernia repair     2009  . knee surgery     patella fx, Delbert HarnessMurphy Wainer  . TONSILLECTOMY      Family History  Problem Relation Age of Onset  . Anxiety disorder Mother   . Heart disease Father     pacemaker, CAD in his 6370s    Social History   Social History  . Marital status: Divorced    Spouse name: N/A  . Number of children: N/A  . Years of education: N/A   Social History Main Topics  . Smoking status: Never Smoker  . Smokeless tobacco: Never Used  . Alcohol use 1.2 oz/week    2 Standard drinks or equivalent per week     Comment: rare  . Drug use: No     Comment: Caffiene-1 cup 3 times a week.  [name]. Sexual activity: Yes    Partners: Female   Other Topics Concern  . None   Social History Narrative   Work or School: part time work [employer], on disability for mental health, hx of hospital visit for dehydration and she was then hospitalized for something to do with with psychiatric illness - she feels by mistake      Home Situation: lives alone with a dog; has 2 grandkids      Spiritual Beliefs: catholic beliefs, non-practicing      Lifestyle: regular exercise; diet is pretty good        Current Outpatient Prescriptions:  .  clonazePAM (KLONOPIN) 1 MG tablet, Take 0.5 mg by mouth as needed for anxiety. For anxiety, Disp: , Rfl:  .  lamoTRIgine (LAMICTAL) 100 MG tablet, TAKE 2 TABLETS (200 MG TOTAL) BY MOUTH DAILY., Disp: 60 tablet, Rfl: 3  EXAM:  Vitals:   10/18/16 0754  BP: 100/62  Pulse: 67  Temp: 97.7 F (36.5 C)    GENERAL: vitals reviewed and listed below, alert, oriented, appears well hydrated and in no acute distress  HEENT: head atraumatic, PERRLA, normal appearance of eyes, ears, nose and mouth. moist mucus membranes.  NECK: supple, no masses or lymphadenopathy  LUNGS: clear to auscultation bilaterally, no rales, rhonchi or wheeze  CV: HRRR, no peripheral edema or cyanosis, normal pedal pulses  BREAST: declined, does with  gyn  ABDOMEN: bowel sounds normal, soft, non tender to palpation, no masses, no rebound or guarding  GU: declined, sees gyn  SKIN: no rash or abnormal lesions, small 1 cm in diameter sub cut cyst L parietal region  MS: normal gait, moves all extremities normally  NEURO: normal gait, speech and thought processing grossly intact, muscle tone grossly intact throughout  PSYCH: normal affect, pleasant and cooperative  ASSESSMENT AND PLAN:  Discussed the following assessment and plan:  Encounter for preventive health examination - Plan: Lipid Panel, Hemoglobin A1c  Plantar fasciitis   -Discussed and advised all [place] preventive services health task force level A and B recommendations for age, sex and risks.  -Advised at least 150 minutes of exercise per week and a healthy diet with avoidance of (less then 1 serving per week) processed foods, white starches, red meat, fast foods and sweets and consisting of: * 5-9 servings of fresh fruits and vegetables (not corn or potatoes) *nuts and seeds, beans *olives and olive oil *lean meats such as fish and white chicken  *whole grains  -labs, studies and vaccines per orders this encounter  Orders Placed This Encounter  Procedures  . Lipid Panel  . Hemoglobin A1c    Patient advised to return to clinic immediately if symptoms worsen or persist or new concerns.  Patient Instructions  BEFORE YOU LEAVE: -labs -order cologuard test -plantar fasciitis exercises -follow up: yearly and as needed  Complete the cologuard test in the next few weeks. Call us if any questions.  Do the exercises for the L shoulder and ease up on lifting above 90 degrees until feeling better. Call in 1 month if not feeling better.  See your gynecologist yearly for women's health exam.  Vit D3 [phone] IU daily.  We have ordered labs or studies at this visit. It can take up to 1-2 weeks for results and processing. IF results require follow up or explanation, we  will call you with instructions. Clinically stable results will be released to your [facility]. If you have not heard from [place] or cannot find your results in [employer] [facility] in 2 weeks please contact our office at [phone].  If you are not yet signed up for [facility], please consider signing up.    We recommend the following healthy lifestyle for LIFE: 1) Small portions.   Tip: eat off of a salad plate instead of a dinner plate.  Tip: if you need more or a snack choose fruits, veggies and/or a handful of nuts or seeds.  2) Eat a healthy clean diet.  * Tip: Avoid (less then 1 serving per week): processed foods, sweets, sweetened drinks, white starches (rice, flour, bread, potatoes, pasta, etc), red meat, fast foods, butter  *Tip: CHOOSE instead   * 5-9 servings per day of fresh or frozen fruits and vegetables (but not corn, potatoes, bananas, canned  or dried fruit)   *nuts and seeds, beans   *olives and olive oil   *small portions of lean meats such as fish and white chicken    *small portions of whole grains  3)Get at least 150 minutes of sweaty aerobic exercise per week.  4)Reduce stress - consider counseling, meditation and relaxation to balance other aspects of your life.        No Follow-up on file.  Kriste BasqueKIM, Rolf Fells R., DO

## 2016-10-18 ENCOUNTER — Ambulatory Visit (INDEPENDENT_AMBULATORY_CARE_PROVIDER_SITE_OTHER): Payer: Medicare Other | Admitting: Family Medicine

## 2016-10-18 ENCOUNTER — Encounter: Payer: Self-pay | Admitting: Family Medicine

## 2016-10-18 VITALS — BP 100/62 | HR 67 | Temp 97.7°F | Ht 67.75 in | Wt 190.0 lb

## 2016-10-18 DIAGNOSIS — M722 Plantar fascial fibromatosis: Secondary | ICD-10-CM | POA: Diagnosis not present

## 2016-10-18 DIAGNOSIS — Z0000 Encounter for general adult medical examination without abnormal findings: Secondary | ICD-10-CM

## 2016-10-18 LAB — LIPID PANEL
Cholesterol: 200 mg/dL (ref 0–200)
HDL: 64 mg/dL (ref >39.00)
LDL CALC: 119 mg/dL — AB (ref 0–99)
NONHDL: 136.24
Total CHOL/HDL Ratio: 3
Triglycerides: 88 mg/dL (ref 0.0–149.0)
VLDL: 17.6 mg/dL (ref 0.0–40.0)

## 2016-10-18 LAB — HEMOGLOBIN A1C: HEMOGLOBIN A1C: 5.9 % (ref 4.6–6.5)

## 2016-10-18 NOTE — Progress Notes (Signed)
Pre visit review using our clinic review tool, if applicable. No additional management support is needed unless otherwise documented below in the visit note. 

## 2016-10-18 NOTE — Patient Instructions (Addendum)
BEFORE YOU LEAVE: -labs -order cologuard test -plantar fasciitis exercises -follow up: yearly and as needed  Complete the cologuard test in the next few weeks. Call us if any questions.  Do the exercises for the L shoulder and ease up on lifting above 90 degrees until feeling better. Call in 1 month if not feeling better.  See your gynecologist yearly for women's health exam.  Vit D3 [phone] daily.  We have ordered labs or studies at this visit. It can take up to 1-2 weeks for results and processing. IF results require follow up or explanation, we will call you with instructions. Clinically stable results will be released to your [facility]. If you have not heard from us or cannot find your results in [facility] in 2 weeks please contact our office at [phone].  If you are not yet signed [facility] for [facility], please consider signing up.    We recommend the following healthy lifestyle for LIFE: 1) Small portions.   Tip: eat off of a salad plate instead of a dinner plate.  Tip: if you need more or a snack choose fruits, veggies and/or a handful of nuts or seeds.  2) Eat a healthy clean diet.  * Tip: Avoid (less then 1 serving per week): processed foods, sweets, sweetened drinks, white starches (rice, flour, bread, potatoes, pasta, etc), red meat, fast foods, butter  *Tip: CHOOSE instead   * 5-9 servings per day of fresh or frozen fruits and vegetables (but not corn, potatoes, bananas, canned or dried fruit)   *nuts and seeds, beans   *olives and olive oil   *small portions of lean meats such as fish and white chicken    *small portions of whole grains  3)Get at least 150 minutes of sweaty aerobic exercise per week.  4)Reduce stress - consider counseling, meditation and relaxation to balance other aspects of your life.

## 2017-04-18 ENCOUNTER — Encounter: Payer: Self-pay | Admitting: Family Medicine

## 2017-04-18 ENCOUNTER — Ambulatory Visit (INDEPENDENT_AMBULATORY_CARE_PROVIDER_SITE_OTHER): Payer: Medicare Other | Admitting: Family Medicine

## 2017-04-18 VITALS — BP 102/72 | HR 70 | Temp 97.8°F | Ht 67.75 in | Wt 193.8 lb

## 2017-04-18 DIAGNOSIS — M722 Plantar fascial fibromatosis: Secondary | ICD-10-CM | POA: Diagnosis not present

## 2017-04-18 MED ORDER — NAPROXEN 500 MG PO TABS: 500.0000 mg | ORAL_TABLET | Freq: Two times a day (BID) | ORAL | 0 refills | Status: DC

## 2017-04-18 NOTE — Progress Notes (Signed)
HPI:  Acute visit for L heal pain: -x 6 months -? 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  Past Medical History:  Diagnosis Date  . ALLERGIC RHINITIS 01/10/2008  . ANXIETY 01/10/2008  . ATTENTION DEFICIT HYPERACTIVITY DISORDER, ADULT 10/28/2009  . BIPOLAR DISORDER UNSPECIFIED 01/10/2008  . CHEST PAIN, ATYPICAL 01/10/2008  . DEPRESSION 01/10/2008  . INSOMNIA 09/17/2010  . PANIC DISORDER 01/10/2008  . PHLEBITIS\T\THROMBOPHLEB SUP VESSELS LOWER EXTREM 07/05/2010  . PLANTAR WART, LEFT 12/29/2008  . Sebaceous cyst    scalp, seeing general surgeon    Past Surgical History:  Procedure Laterality Date  . inguinal hernia repair     2009  . knee surgery     patella fx, Delbert HarnessMurphy Wainer  . TONSILLECTOMY      Family History  Problem Relation Age of Onset  . Anxiety disorder Mother   . Heart disease Father        pacemaker, CAD in his 3370s    Social History   Social History  . Marital status: Divorced    Spouse name: N/A  . Number of children: N/A  . Years of education: N/A   Social History Main Topics  . Smoking status: Never Smoker  . Smokeless tobacco: Never Used  . Alcohol use 1.2 oz/week    2 Standard drinks or equivalent per week     Comment: rare  . Drug use: No     Comment: Caffiene-1 cup 3 times a week.  [name]. Sexual activity: Yes    Partners: Female   Other Topics Concern  . None   Social History Narrative   Work or School: part time work [employer], on disability for mental health, hx of hospital visit for dehydration and she was then hospitalized for something to do with with psychiatric illness - she feels by mistake      Home Situation: lives alone with a dog; has 2 grandkids      Spiritual Beliefs: catholic beliefs,  non-practicing      Lifestyle: regular exercise; diet is pretty good        Current Outpatient Prescriptions:  .  clonazePAM (KLONOPIN) 1 MG tablet, Take 0.5 mg by mouth as needed for anxiety. For anxiety, Disp: , Rfl:  .  lamoTRIgine (LAMICTAL) 100 MG tablet, TAKE 2 TABLETS (200 MG TOTAL) BY MOUTH DAILY., Disp: 60 tablet, Rfl: 3 .  naproxen (NAPROSYN) 500 MG tablet, Take 1 tablet (500 mg total) by mouth 2 (two) times daily with a meal., Disp: 14 tablet, Rfl: 0  EXAM:  Vitals:   04/18/17 1407  BP: 102/72  Pulse: 70  Temp: 97.8 F (36.6 C)    Body mass index is 29.69 kg/m.  GENERAL: vitals reviewed and listed above, alert, oriented, appears well hydrated and in no acute distress  HEENT: atraumatic, conjunttiva clear, no obvious abnormalities on inspection of external nose and ears  NECK: no obvious masses on inspection  MS: moves all extremities without noticeable abnormality, on inspection both feet she had pes planus and some collapse horizontal arch bilat - actually more pronounced on the R, she has some edema around the L ankle, TTP in the plantar fascia of the L foot, strength/ sensitivity to light touch/ROM intact bilat ankles/feet, normal pedal pulses  PSYCH: pleasant  and cooperative, no obvious depression or anxiety  ASSESSMENT AND PLAN:  Discussed the following assessment and plan:  Plantar fasciitis  -we discussed possible serious and likely etiologies, workup and treatment, treatment risks and return precautions; suspect plantar fascitis -after this discussion, Neeti prefers noninvasive and conservative measures - opted for trial strassburg sock, proper foot wear/inserts, short course naproxen, ice/top menth prn  -follow up advised in 1 month -of course, we advised Angee  to return or notify a doctor immediately if symptoms worsen or persist or new concerns arise.   Patient Instructions  BEFORE YOU LEAVE: -follow up: 1 month  [place]burg sock at  night.  Heal lifts.  Ice when sore.  Take the naproxen as instructed for 1 weak. Then may use topical menthol (tiger balm) and/or over the counter aleve per instructions as needed for pain.      Kriste Basque R., DO

## 2017-04-18 NOTE — Patient Instructions (Signed)
BEFORE YOU LEAVE: -follow up: 1 month  [place]assburg sock at night.  Heal lifts.  Ice when sore.  Take the naproxen as instructed for 1 weak. Then may use topical menthol (tiger balm) and/or over the counter aleve per instructions as needed for pain.

## 2017-11-13 DIAGNOSIS — R69 Illness, unspecified: Secondary | ICD-10-CM | POA: Diagnosis not present

## 2017-11-27 DIAGNOSIS — F4310 Post-traumatic stress disorder, unspecified: Secondary | ICD-10-CM | POA: Diagnosis not present

## 2017-11-27 DIAGNOSIS — R69 Illness, unspecified: Secondary | ICD-10-CM | POA: Diagnosis not present

## 2017-12-12 DIAGNOSIS — R69 Illness, unspecified: Secondary | ICD-10-CM | POA: Diagnosis not present

## 2017-12-18 DIAGNOSIS — R69 Illness, unspecified: Secondary | ICD-10-CM | POA: Diagnosis not present

## 2017-12-25 DIAGNOSIS — R69 Illness, unspecified: Secondary | ICD-10-CM | POA: Diagnosis not present

## 2018-01-01 DIAGNOSIS — R69 Illness, unspecified: Secondary | ICD-10-CM | POA: Diagnosis not present

## 2018-01-08 DIAGNOSIS — R69 Illness, unspecified: Secondary | ICD-10-CM | POA: Diagnosis not present

## 2018-01-23 DIAGNOSIS — R69 Illness, unspecified: Secondary | ICD-10-CM | POA: Diagnosis not present

## 2018-01-25 DIAGNOSIS — R69 Illness, unspecified: Secondary | ICD-10-CM | POA: Diagnosis not present

## 2018-01-25 DIAGNOSIS — F3132 Bipolar disorder, current episode depressed, moderate: Secondary | ICD-10-CM | POA: Diagnosis not present

## 2018-01-29 DIAGNOSIS — R69 Illness, unspecified: Secondary | ICD-10-CM | POA: Diagnosis not present

## 2018-02-05 DIAGNOSIS — R69 Illness, unspecified: Secondary | ICD-10-CM | POA: Diagnosis not present

## 2018-02-12 DIAGNOSIS — R69 Illness, unspecified: Secondary | ICD-10-CM | POA: Diagnosis not present

## 2018-02-19 DIAGNOSIS — R69 Illness, unspecified: Secondary | ICD-10-CM | POA: Diagnosis not present

## 2018-02-26 DIAGNOSIS — R69 Illness, unspecified: Secondary | ICD-10-CM | POA: Diagnosis not present

## 2018-03-19 DIAGNOSIS — R69 Illness, unspecified: Secondary | ICD-10-CM | POA: Diagnosis not present

## 2018-03-26 DIAGNOSIS — R69 Illness, unspecified: Secondary | ICD-10-CM | POA: Diagnosis not present

## 2018-04-16 DIAGNOSIS — R69 Illness, unspecified: Secondary | ICD-10-CM | POA: Diagnosis not present

## 2018-04-26 DIAGNOSIS — R69 Illness, unspecified: Secondary | ICD-10-CM | POA: Diagnosis not present

## 2018-04-26 DIAGNOSIS — F4310 Post-traumatic stress disorder, unspecified: Secondary | ICD-10-CM | POA: Diagnosis not present

## 2018-04-30 DIAGNOSIS — R69 Illness, unspecified: Secondary | ICD-10-CM | POA: Diagnosis not present

## 2018-05-14 DIAGNOSIS — R69 Illness, unspecified: Secondary | ICD-10-CM | POA: Diagnosis not present

## 2018-05-20 DIAGNOSIS — M129 Arthropathy, unspecified: Secondary | ICD-10-CM | POA: Diagnosis not present

## 2018-05-20 DIAGNOSIS — M25571 Pain in right ankle and joints of right foot: Secondary | ICD-10-CM | POA: Diagnosis not present

## 2018-05-20 DIAGNOSIS — M79671 Pain in right foot: Secondary | ICD-10-CM | POA: Diagnosis not present

## 2018-05-20 DIAGNOSIS — M1380 Other specified arthritis, unspecified site: Secondary | ICD-10-CM | POA: Diagnosis not present

## 2018-05-21 ENCOUNTER — Telehealth: Payer: Self-pay

## 2018-05-21 ENCOUNTER — Encounter: Payer: Self-pay | Admitting: Family Medicine

## 2018-05-21 ENCOUNTER — Ambulatory Visit (INDEPENDENT_AMBULATORY_CARE_PROVIDER_SITE_OTHER): Payer: Medicare HMO | Admitting: Family Medicine

## 2018-05-21 VITALS — BP 90/64 | HR 66 | Temp 98.2°F | Ht 67.75 in | Wt 184.8 lb

## 2018-05-21 DIAGNOSIS — R69 Illness, unspecified: Secondary | ICD-10-CM | POA: Diagnosis not present

## 2018-05-21 DIAGNOSIS — Z1211 Encounter for screening for malignant neoplasm of colon: Secondary | ICD-10-CM | POA: Diagnosis not present

## 2018-05-21 DIAGNOSIS — Z1231 Encounter for screening mammogram for malignant neoplasm of breast: Secondary | ICD-10-CM

## 2018-05-21 DIAGNOSIS — M79671 Pain in right foot: Secondary | ICD-10-CM

## 2018-05-21 DIAGNOSIS — Z1239 Encounter for other screening for malignant neoplasm of breast: Secondary | ICD-10-CM

## 2018-05-21 NOTE — Telephone Encounter (Signed)
Order completed and faxed to [employer] at [phone]. 

## 2018-05-21 NOTE — Patient Instructions (Addendum)
BEFORE YOU LEAVE: -follow up: yearly and as needed  -We placed a referral for you as discussed for the foot pain. It usually takes about 1-2 weeks to process and schedule this referral. If you have not heard from us regarding this appointment in 2 weeks please contact our office.  Ice, supportive shoes, as needed naproxen per instructions in the interim.   Please complete the Cologuard for colon cancer screening  Get your mammogram.

## 2018-05-21 NOTE — Progress Notes (Signed)
HPI:  Using dictation device. Unfortunately this device frequently misinterprets words/phrases.  Acute visit for R foot pain: -? For a few months -electrical shock like pains in medial R foot from behind malleolus to medial digist intermittent -really painful a few days ago and went to urgent care - now feeling better -no weakness or numbness, fevers, malaise or increased swelling of leg -does have some swelling around R ankle  Preventive: -reports has cologuard kit but has not done -reports sees Dr. Ouida Sills for gyn/mammo care  ROS: See pertinent positives and negatives per HPI.  Past Medical History:  Diagnosis Date  . ALLERGIC RHINITIS 01/10/2008  . ANXIETY 01/10/2008  . ATTENTION DEFICIT HYPERACTIVITY DISORDER, ADULT 10/28/2009  . BIPOLAR DISORDER UNSPECIFIED 01/10/2008  . CHEST PAIN, ATYPICAL 01/10/2008  . DEPRESSION 01/10/2008  . INSOMNIA 09/17/2010  . PANIC DISORDER 01/10/2008  . PHLEBITIS\T\THROMBOPHLEB SUP VESSELS LOWER EXTREM 07/05/2010  . PLANTAR WART, LEFT 12/29/2008  . Sebaceous cyst    scalp, seeing general surgeon    Past Surgical History:  Procedure Laterality Date  . inguinal hernia repair     2009  . knee surgery     patella fx, Raliegh Ip  . TONSILLECTOMY      Family History  Problem Relation Age of Onset  . Anxiety disorder Mother   . Heart disease Father        pacemaker, CAD in his 43s    SOCIAL HX: see hpi   Current Outpatient Medications:  .  clonazePAM (KLONOPIN) 1 MG tablet, Take 0.5 mg by mouth as needed for anxiety. For anxiety, Disp: , Rfl:  .  lamoTRIgine (LAMICTAL) 100 MG tablet, TAKE 2 TABLETS (200 MG TOTAL) BY MOUTH DAILY., Disp: 60 tablet, Rfl: 3 .  naproxen (NAPROSYN) 500 MG tablet, Take 1 tablet (500 mg total) by mouth 2 (two) times daily with a meal., Disp: 14 tablet, Rfl: 0  EXAM:  Vitals:   05/21/18 1116  BP: 90/64  Pulse: 66  Temp: 98.2 F (36.8 C)  SpO2: 98%    Body mass index is 28.31 kg/m.  GENERAL: vitals  reviewed and listed above, alert, oriented, appears well hydrated and in no acute distress  HEENT: atraumatic, conjunttiva clear, no obvious abnormalities on inspection of external nose and ears  NECK: no obvious masses on inspection  LUNGS: clear to auscultation bilaterally, no wheezes, rales or rhonchi, good air movement  CV: HRRR, trace bilateral ankle edema  MS: moves all extremities without noticeable abnormality, she has some swelling specific to the right medial malleolar region, she has some tenderness to palpation around the tarsal tunnel, mild pes planus with valgus deformity of the right ankle greater than left, good cap refill and sensitivity to light touch in the foot throughout  PSYCH: pleasant and cooperative, no obvious depression or anxiety  ASSESSMENT AND PLAN:  Discussed the following assessment and plan:  Right foot pain - Plan: Ambulatory referral to Podiatry -Very tarsal tunnel syndrome -Referral to podiatry -In interim recommended ice as needed, and send as needed, good supportive footwear -Follow-up here as needed  Colon cancer screening -Advised to complete -She prefers to check at home to see if her kit is expired, she agrees to call us to reorder if needed  Breast cancer screening -Advised to do annual breast cancer screening, she prefers to do this with her gynecologist and agrees to set this up  -Patient advised to return or notify a doctor immediately if symptoms worsen or persist or new concerns arise.  Patient Instructions  BEFORE YOU LEAVE: -follow up: yearly and as needed  -We placed a referral for you as discussed for the foot pain. It usually takes about 1-2 weeks to process and schedule this referral. If you have not heard from [place] regarding this appointment in 2 weeks please contact our office.  Ice, supportive shoes, as needed naproxen per instructions in the interim.   Please complete the Cologuard for colon cancer screening  Get your  mammogram.    Lucretia Kern, DO

## 2018-05-21 NOTE — Telephone Encounter (Signed)
Pt called to report her Cologuard order has expired. Please place new order. Thanks!

## 2018-05-28 DIAGNOSIS — R69 Illness, unspecified: Secondary | ICD-10-CM | POA: Diagnosis not present

## 2018-05-30 ENCOUNTER — Ambulatory Visit: Payer: Medicare HMO | Admitting: Podiatry

## 2018-05-30 ENCOUNTER — Other Ambulatory Visit: Payer: Self-pay

## 2018-05-30 ENCOUNTER — Other Ambulatory Visit: Payer: Self-pay | Admitting: Podiatry

## 2018-05-30 ENCOUNTER — Ambulatory Visit (INDEPENDENT_AMBULATORY_CARE_PROVIDER_SITE_OTHER): Payer: Medicare HMO

## 2018-05-30 ENCOUNTER — Encounter: Payer: Self-pay | Admitting: Podiatry

## 2018-05-30 VITALS — BP 117/79 | HR 80

## 2018-05-30 DIAGNOSIS — M79671 Pain in right foot: Secondary | ICD-10-CM

## 2018-05-30 DIAGNOSIS — E559 Vitamin D deficiency, unspecified: Secondary | ICD-10-CM | POA: Insufficient documentation

## 2018-05-30 DIAGNOSIS — R5383 Other fatigue: Secondary | ICD-10-CM | POA: Insufficient documentation

## 2018-05-30 DIAGNOSIS — M25571 Pain in right ankle and joints of right foot: Secondary | ICD-10-CM | POA: Diagnosis not present

## 2018-05-30 DIAGNOSIS — M76821 Posterior tibial tendinitis, right leg: Secondary | ICD-10-CM

## 2018-05-30 MED ORDER — NAPROXEN 500 MG PO TABS: 500.0000 mg | ORAL_TABLET | Freq: Two times a day (BID) | ORAL | 0 refills | Status: DC

## 2018-05-30 NOTE — Patient Instructions (Signed)
Posterior Tibialis Tendinosis Posterior tibialis tendinosis is irritation and degeneration of a tendon called the posterior tibial tendon. Your posterior tibial tendon is a cord-like tissue that connects bones of your lower leg and foot to a muscle that:  Supports your arch.  Helps you raise up on your toes.  Helps you turn your foot down and in.  This condition causes foot and ankle pain and can lead to a flat foot. What are the causes? This condition is most often caused by repeated stress to the tendon (overuse injury). It can also be caused by a sudden injury that stresses the tendon, such as landing on your foot after jumping or falling. What increases the risk? This condition is more likely to develop in:  People who play a sport that involves putting a lot of pressure on the feet, such as: ? Basketball. ? Tennis. ? Soccer. ? Hockey.  Runners.  Females who are older than 40 years and are overweight.  People with diabetes.  People with decreased foot stability (ligamentous laxity).  People with flat feet.  What are the signs or symptoms? Symptoms of this condition may start suddenly or gradually. Symptoms include:  Pain in the inner ankle.  Pain at the arch of your foot.  Pain that gets worse with running, walking, or standing.  Swelling on the inside of your ankle and foot.  Weakness in your ankle or foot.  Inability to stand up on tiptoe.  How is this diagnosed? This condition may be diagnosed based on:  Your symptoms.  Your medical history.  A physical exam.  Tests, such as: ? An X-ray. ? MRI. ? An ultrasound.  During the physical exam, your health care provider may move your foot and ankle, test your strength and balance, and check the arch of your foot while you stand or walk. How is this treated? This condition may be treated by:  Replacing high-impact exercise with low-impact exercise, such as swimming or cycling.  Applying ice to the  injured area.  Taking an anti-inflammatory pain medicine.  Physical therapy.  Wearing a special shoe or shoe insert to support your arch (orthotic).  If your symptoms do not improve with these treatments, you may need to wear a splint, removable walking boot, or short leg cast for 6-8 weeks to keep your foot and ankle still. Follow these instructions at home: If you have a boot or splint:  Wear the boot or splint as told by your health care provider. Remove it only as told by your health care provider.  Do not use your foot to support (bear) your full body weight until your health care provider says that you can.  Loosen the boot or splint if your toes tingle, become numb, or turn cold and blue.  Keep the boot or splint clean.  If your boot or splint is not waterproof: ? Do not let it get wet. ? Cover it with a watertight plastic bag when you take a bath or shower. If you have a cast:  Do not stick anything inside the cast to scratch your skin. Doing that increases your risk of infection.  Check the skin around the cast every day. Tell your health care provider about any concerns.  You may put lotion on dry skin around the edges of the cast. Do not apply lotion to the skin underneath the cast.  Keep the cast clean.  Do not take baths, swim, or use a hot tub until your health care  provider approves. Ask your health care provider if you can take showers. You may only be allowed to take sponge baths for bathing.  If your cast is not waterproof: ? Do not let it get wet. ? Cover it with a watertight plastic bag while you take a bath or a shower. Managing pain and swelling  Take over-the-counter and prescription medicines only as told by your health care provider.  If directed, apply ice to the injured area: ? Put ice in a plastic bag. ? Place a towel between your skin and the bag. ? Leave the ice on for 20 minutes, 2-3 times a day.  Raise (elevate) your ankle above the level  of your heart when resting if you have swelling. Activity  Do not do activities that make pain or swelling worse.  Return to full activity gradually as symptoms improve.  Do exercises as told by your health care provider. General instructions  If you have an orthotic, use it as told by your health care provider.  Keep all follow-up visits as told by your health care provider. This is important. How is this prevented?  Wear footwear that is appropriate to your athletic activity.  Avoid athletic activities that cause pain or swelling in your ankle or foot.  Before being active, do range-of-motion and stretching exercises.  If you develop pain or swelling while training, stop training.  If you have pain or swelling that does not improve after a few days of rest, see your health care provider.  If you start a new athletic activity, start gradually so you can build up your strength and flexibility. Contact a health care provider if:  Your symptoms get worse.  Your symptoms do not improve in 6-8 weeks.  You develop new, unexplained symptoms.  Your splint, boot, or cast gets damaged. This information is not intended to replace advice given to you by your health care provider. Make sure you discuss any questions you have with your health care provider. Document Released: 11/14/2005 Document Revised: 07/19/2016 Document Reviewed: 07/31/2015 Elsevier Interactive Patient Education  2018 [employer] Ankle Sprain An ankle sprain is a stretch or tear in one of the tough tissues (ligaments) in your ankle. Follow these instructions at home:  Rest your ankle.  Take over-the-counter and prescription medicines only as told by your doctor.  For 2-3 days, keep your ankle higher than the level of your heart (elevated) as much as possible.  If directed, put ice on the area: ? Put ice in a plastic bag. ? Place a towel between your skin and the bag. ? Leave the ice on for 20 minutes, 2-3  times a day.  If you were given a brace: ? Wear it as told. ? Take it off to shower or bathe. ? Try not to move your ankle much, but wiggle your toes from time to time. This helps to prevent swelling.  If you were given an elastic bandage (dressing): ? Take it off when you shower or bathe. ? Try not to move your ankle much, but wiggle your toes from time to time. This helps to prevent swelling. ? Adjust the bandage to make it more comfortable if it feels too tight. ? Loosen the bandage if you lose feeling in your foot, your foot tingles, or your foot gets cold and blue.  If you have crutches, use them as told by your doctor. Continue to use them until you can walk without feeling pain in your ankle. Contact a  doctor if:  Your bruises or swelling are quickly getting worse.  Your pain does not get better after you take medicine. Get help right away if:  You cannot feel your toes or foot.  Your toes or your foot looks blue.  You have very bad pain that gets worse. This information is not intended to replace advice given to you by your health care provider. Make sure you discuss any questions you have with your health care provider. Document Released: 05/02/2008 Document Revised: 04/21/2016 Document Reviewed: 06/16/2015 Elsevier Interactive Patient Education  [employer].

## 2018-06-03 NOTE — Progress Notes (Signed)
Subjective: [name] is a pleasant 52 y.o. WF who presents to clinic with 2-3 month history of right foot pain. She was seen in urgent care and had xrays done and have presented a disk with xrays on today. She was offered an injection at Urgent Care clinic, but she declined. She also relates swelling, tingling and numbness in 2nd toe. She thinks she may have strained her foot doing yard work which she loves to do.  Medical History    09/17/2010 INSOMNIA  07/05/2010 PHLEBITIS\T\THROMBOPHLEB SUP VESSELS LOWER EXTREM  10/28/2009 ATTENTION DEFICIT HYPERACTIVITY DISORDER, ADULT  12/29/2008 PLANTAR WART, LEFT  01/10/2008 BIPOLAR DISORDER UNSPECIFIED  01/10/2008 ANXIETY  01/10/2008 PANIC DISORDER  01/10/2008 DEPRESSION  01/10/2008 ALLERGIC RHINITIS  01/10/2008 CHEST PAIN, ATYPICAL  Date Unknown Sebaceous cyst    Problem List   Other  BIPOLAR DISORDER UNSPECIFIED   Anxiety state   PANIC DISORDER   PTSD (post-traumatic stress disorder)   Fatigue   Vitamin D deficiency    Surgical History   Date Unknown inguinal hernia repair [Other]   Date Unknown knee surgery [Other]   Date Unknown Tonsillectomy   Medications    clonazePAM (KLONOPIN) 0.5 MG tablet    clonazePAM (KLONOPIN) 1 MG tablet    lamoTRIgine (LAMICTAL) 100 MG tablet    naproxen (NAPROSYN) 500 MG tablet    Allergies      No Known Allergies   Tobacco History   Smoking Status  Never Smoker  Smokeless Tobacco Status  Never Used   Family History   Mother Anxiety disorder         Father Heart disease     Immunizations/Injections   Influenza Whole10/11/2009   ROS: see HPI; all others negative  Vitals:   05/30/18 1028  BP: 117/79  Pulse: 80    Vascular: Dorsalis pedis and posterior tibial pulses palpable bilaterally. Capillary refill time immediate x 10 digits. No pedal edema b/l. No varicosities b/l. Skin temperature gradient WNL b/l.  Dermatological: Normal skin turgor, texture and tone b/l No skin eruptions  noted b/l Nails 1-5 b/l normal b/l  Neurological: Epicritic sensation grossly intact b/l and symmetrically with 10 gram monofilament. Vibratory sensation intact b/l  Musculoskeletal: Muscle strength 5/5 to all LE muscle groups b/l Negative Tinel's sign b/l Pain along PT tendon right LE  Xray findings: No evidence of fracture noted, no bony destruction.  Assessment: 1. Discussed dx and tx options. [name] to limit weightbearing right lower extremity. Dispensed compression anklet and ankle brace for right lower extremity. She may remove both for bathing/showering and sleeping. 2. Order MRI right ankle/foot to assess PT tendon pathology. 3. Follow up after MRI study completed. 4. Check insurance benefits for orthotics with dx of PT tendonitis.

## 2018-06-04 ENCOUNTER — Inpatient Hospital Stay: Admission: RE | Admit: 2018-06-04 | Payer: Self-pay | Source: Ambulatory Visit

## 2018-06-06 ENCOUNTER — Telehealth: Payer: Self-pay | Admitting: *Deleted

## 2018-06-06 DIAGNOSIS — R69 Illness, unspecified: Secondary | ICD-10-CM | POA: Diagnosis not present

## 2018-06-06 NOTE — Telephone Encounter (Signed)
Pt presents to office stating the brace is the incorrect size and her foot hurts a terrible locking pain without warning. Pt called her insurance company and they said it would be 7-14 days before approval unless a doctor ordered as urgent. Pt states he shoe size is 9, and I reviewed size chart on plantar fascial braces and size 9 is a large -x-large which is proper for pt. I showed pt how to properly fit the brace snuggly around the ankle and attach the plantar strap comfortably, which is an indicator the brace is applied properly. Pt states the brace is comfortable, but she has been having pain along the arch, medial ankle. I spoke with Tomasa HoseJ. Quintana, RN she contacted pt's insurance and they states the case is in review after receiving clinicals.

## 2018-06-08 ENCOUNTER — Telehealth: Payer: Self-pay | Admitting: *Deleted

## 2018-06-08 NOTE — Telephone Encounter (Signed)
"  I am calling in regards to the request for authorization for MRI.  The request is pending denial.  A physician can call for a peer to peer or you can send additional notes."  What's your peer to peer review phone number and what is the fax number to send additional clinical notes?  "The peer to peer phone number is [phone] and the fax number is [phone]."  I will let the physician know.  (The order is under Dr. Beverlee Nimsegal's name.)

## 2018-06-08 NOTE — Telephone Encounter (Signed)
I called for authorization for MRI. I spoke to clinical reviewer Amy. MRI approved. Please call for authorization number as we were disconnected.

## 2018-06-11 ENCOUNTER — Ambulatory Visit
Admission: RE | Admit: 2018-06-11 | Discharge: 2018-06-11 | Disposition: A | Discharge status: Home / Self Care | Payer: Medicare HMO | Source: Ambulatory Visit | Attending: Podiatry | Admitting: Podiatry

## 2018-06-11 DIAGNOSIS — R69 Illness, unspecified: Secondary | ICD-10-CM | POA: Diagnosis not present

## 2018-06-11 DIAGNOSIS — M7671 Peroneal tendinitis, right leg: Secondary | ICD-10-CM | POA: Diagnosis not present

## 2018-06-15 ENCOUNTER — Telehealth: Payer: Self-pay | Admitting: *Deleted

## 2018-06-15 NOTE — Telephone Encounter (Signed)
Copied from CRM [id number]. Topic: Quick Communication - Other Results >> Jun 15, 2018  4:21 PM Herby AbrahamJohnson, Shiquita C wrote: Pt called in for results.   CB: [phone]

## 2018-06-18 ENCOUNTER — Telehealth: Payer: Self-pay | Admitting: Podiatry

## 2018-06-18 DIAGNOSIS — R69 Illness, unspecified: Secondary | ICD-10-CM | POA: Diagnosis not present

## 2018-06-18 NOTE — Telephone Encounter (Signed)
Left message for pt to call to schedule for appt to discuss MRI results.

## 2018-06-18 NOTE — Telephone Encounter (Signed)
Pt is wondering about MRI results. Please give her a call back.

## 2018-06-18 NOTE — Telephone Encounter (Signed)
I called the pt and questioned what results she was referring to.  She stated she was calling for results of the MRI of the foot and I advised she contact the podiatry office for this as these were ordered there.  Patient agreed.

## 2018-06-22 NOTE — Telephone Encounter (Signed)
Dr. Eloy EndGalaway states she will contact pt today and discuss the results.

## 2018-06-22 NOTE — Telephone Encounter (Signed)
Thanks Joya SanValery!

## 2018-06-22 NOTE — Telephone Encounter (Signed)
Hi [name], [name] just showed me how to respond to your messages. Here is my cell number if anything needs my immediate attention: [phone].  Feel free to text or call me if I don't respond, especially Monday through Thursdays. I'm usually at assisted living facilities on those days.

## 2018-06-25 DIAGNOSIS — R69 Illness, unspecified: Secondary | ICD-10-CM | POA: Diagnosis not present

## 2018-06-29 ENCOUNTER — Encounter: Payer: Self-pay | Admitting: Podiatry

## 2018-06-29 ENCOUNTER — Ambulatory Visit: Payer: Medicare HMO | Admitting: Podiatry

## 2018-06-29 DIAGNOSIS — M722 Plantar fascial fibromatosis: Secondary | ICD-10-CM | POA: Diagnosis not present

## 2018-06-29 DIAGNOSIS — M76821 Posterior tibial tendinitis, right leg: Secondary | ICD-10-CM | POA: Diagnosis not present

## 2018-07-01 NOTE — Addendum Note (Signed)
Addended by: Freddie BreechGALAWAY, JENNIFER L on: 07/01/2018 03:43 PM   Modules accepted: Level of Service

## 2018-07-01 NOTE — Progress Notes (Signed)
Subjective: [name] is here for follow up of right foot pain and to discuss her MRI results. She states she has had improvement where shes not experiencing pain constantly. She still feels a little sore from time to time. She feels her plantar fascial brace is too big and is requesting a smaller one on today.  Objective:  Vascular: Dorsalis pedis and posterior tibial pulses palpable bilaterally. Capillary refill time immediate x 10 digits. No pedal edema b/l. No varicosities b/l. Skin temperature gradient WNL b/l.  Dermatological: Normal skin turgor, texture and tone b/l No skin eruptions noted b/l Nails 1-5 b/l normal b/l  Neurological: Epicritic sensation grossly intact b/l and symmetrically with 10 gram monofilament. Vibratory sensation intact b/l  Musculoskeletal: Muscle strength 5/5 to all LE muscle groups b/l Negative Tinel's sign b/l No pain along PT tendon on today. +Pain at medial tubercle calcaneus right foot  Xray findings: No evidence of fracture noted, no bony destruction.  MRI findings right ankle: 1. Moderate tendonosis of peroneus brevis 2. Mild thickening and increased signal in the medial band of the plantar fascia at the calcaneal insertion most consistent with plantar fasciitis 3. 6 mm cystic structure along the PTFL likely reflecting a ganglion cyst  Assessment: 1. Posterior tibial tendonitis right 2. Plantar fasciitis right foot 3. Ganglion cyst PTFL (6 mm)-asymptomatic   Plan: 1. MRI results reviewed on today. 2. We discussed local steroid injection for heel. She declines on today. She is interested in orthotics which will help with the PT tendonitis/plantar fasciitis. She will be casted today. 3. We discussed physical therapy. She declines on today. She has a [facility], Dr. Joie BimlerJoe Draper,  she would like to see if he can perform some modalities before seeing a physical therapist.  She would like copies of her records/MRI results.  4. She  was casted for orthotics on today. 5. We will call when her orthotics arrive.

## 2018-07-02 DIAGNOSIS — R69 Illness, unspecified: Secondary | ICD-10-CM | POA: Diagnosis not present

## 2018-07-19 DIAGNOSIS — R69 Illness, unspecified: Secondary | ICD-10-CM | POA: Diagnosis not present

## 2018-07-23 ENCOUNTER — Ambulatory Visit: Payer: Medicare HMO | Admitting: Orthotics

## 2018-07-23 DIAGNOSIS — R69 Illness, unspecified: Secondary | ICD-10-CM | POA: Diagnosis not present

## 2018-07-23 DIAGNOSIS — M76821 Posterior tibial tendinitis, right leg: Secondary | ICD-10-CM

## 2018-07-23 DIAGNOSIS — M722 Plantar fascial fibromatosis: Secondary | ICD-10-CM

## 2018-07-23 NOTE — Progress Notes (Signed)
Patient came in today to p/up functional foot orthotics.   The orthotics were assessed to both fit and function.  The F/O addressed the biomechanical issues/pathologies as intended, offering good longitudinal arch support, proper offloading, and foot support. There weren't any signs of discomfort or irritation.  The F/O fit properly in footwear with minimal trimming/adjustments. 

## 2018-07-27 ENCOUNTER — Telehealth: Payer: Self-pay | Admitting: Podiatry

## 2018-07-27 NOTE — Telephone Encounter (Signed)
Pt left message stating she was calling about her orthotics she got recently..  I returned call and left message for pt to call me back to see how I can help.[name]..[name]

## 2018-08-02 ENCOUNTER — Other Ambulatory Visit: Payer: Medicare HMO | Admitting: Orthotics

## 2018-08-06 DIAGNOSIS — R69 Illness, unspecified: Secondary | ICD-10-CM | POA: Diagnosis not present

## 2018-08-09 ENCOUNTER — Ambulatory Visit: Payer: Medicare HMO | Admitting: Orthotics

## 2018-08-09 DIAGNOSIS — M79671 Pain in right foot: Secondary | ICD-10-CM

## 2018-08-09 DIAGNOSIS — M722 Plantar fascial fibromatosis: Secondary | ICD-10-CM

## 2018-08-09 DIAGNOSIS — M76821 Posterior tibial tendinitis, right leg: Secondary | ICD-10-CM

## 2018-08-09 NOTE — Progress Notes (Signed)
Patient complains that f/o were not right length; however, it was discovered no ABN was sighned for her.  I offered to lenghten f/o and reverse charges since proper protocol was not followed.

## 2018-08-13 DIAGNOSIS — R69 Illness, unspecified: Secondary | ICD-10-CM | POA: Diagnosis not present

## 2018-08-20 DIAGNOSIS — R69 Illness, unspecified: Secondary | ICD-10-CM | POA: Diagnosis not present

## 2018-08-23 ENCOUNTER — Other Ambulatory Visit: Payer: Medicare HMO | Admitting: Orthotics

## 2018-08-27 ENCOUNTER — Ambulatory Visit: Payer: Medicare HMO | Admitting: Orthotics

## 2018-08-27 DIAGNOSIS — M76821 Posterior tibial tendinitis, right leg: Secondary | ICD-10-CM

## 2018-08-27 DIAGNOSIS — M722 Plantar fascial fibromatosis: Secondary | ICD-10-CM

## 2018-08-27 DIAGNOSIS — R69 Illness, unspecified: Secondary | ICD-10-CM | POA: Diagnosis not present

## 2018-08-27 NOTE — Progress Notes (Signed)
Patient picked up her full length f/o.

## 2018-09-06 DIAGNOSIS — R69 Illness, unspecified: Secondary | ICD-10-CM | POA: Diagnosis not present

## 2018-09-10 DIAGNOSIS — R69 Illness, unspecified: Secondary | ICD-10-CM | POA: Diagnosis not present

## 2018-09-20 DIAGNOSIS — R69 Illness, unspecified: Secondary | ICD-10-CM | POA: Diagnosis not present

## 2018-09-25 DIAGNOSIS — R69 Illness, unspecified: Secondary | ICD-10-CM | POA: Diagnosis not present

## 2018-10-01 DIAGNOSIS — R69 Illness, unspecified: Secondary | ICD-10-CM | POA: Diagnosis not present

## 2018-10-08 DIAGNOSIS — R69 Illness, unspecified: Secondary | ICD-10-CM | POA: Diagnosis not present

## 2018-10-11 ENCOUNTER — Ambulatory Visit (INDEPENDENT_AMBULATORY_CARE_PROVIDER_SITE_OTHER): Payer: Medicare HMO

## 2018-10-11 DIAGNOSIS — Z23 Encounter for immunization: Secondary | ICD-10-CM

## 2018-10-29 DIAGNOSIS — R69 Illness, unspecified: Secondary | ICD-10-CM | POA: Diagnosis not present

## 2018-12-03 DIAGNOSIS — R69 Illness, unspecified: Secondary | ICD-10-CM | POA: Diagnosis not present

## 2018-12-17 DIAGNOSIS — R69 Illness, unspecified: Secondary | ICD-10-CM | POA: Diagnosis not present

## 2018-12-18 DIAGNOSIS — F4310 Post-traumatic stress disorder, unspecified: Secondary | ICD-10-CM | POA: Diagnosis not present

## 2018-12-18 DIAGNOSIS — R69 Illness, unspecified: Secondary | ICD-10-CM | POA: Diagnosis not present

## 2018-12-24 ENCOUNTER — Telehealth: Payer: Self-pay | Admitting: Family Medicine

## 2018-12-24 NOTE — Telephone Encounter (Signed)
Pt would like to see if Dr. Selena Batten would complete the form for P.R.E.P. for [facility] pt do not want to be charged for filling it out.  Upon completion of the form pt would like to be called at [phone] to pick it up.  Form was placed in providers folder.

## 2018-12-26 NOTE — Telephone Encounter (Signed)
Can complete form, just due for physical and can then wave charge for form as usually we do not charge for forms if associated with appt. Thanks. Can go ahead and finish form in interim - I already competed my part of the form. Please fill in office info and let pt know. Thanks.

## 2018-12-26 NOTE — Telephone Encounter (Signed)
Per Dr Selena Batten the [name] needs to schedule an appt for a physical and if so, the form can be completed.   I called the [name] and informed her of this, offered different appt times and she stated she will check her schedule and call back for an appt.

## 2018-12-29 NOTE — Progress Notes (Signed)
HPI:  Using dictation device. Unfortunately this device frequently misinterprets words/phrases.  Here for CPE: Due for wt doc, labs, colon cancer screening, mammogram, pap smear soon (does with gyn) -Concerns and/or follow up today: Has gained weight and is motivated to make lifestyle changes. [facility] and [facility] to continue [facility].  Sees psychiatry, Dr. Dawna Part at [facility] for management of Bipolar d/o, PTSD and panic disorder. Planning [facility] for hyper[facility] and [facility].  -Diet: planning to make some changes -Ex[facility] -Taking folic acid, vitamin D or calcium: no -Diabetes and Dyslipidemia Screening: fasting for labs -Vaccines: agrees to tetanus booster -pap history: sees gyn, Dr. Ouida Sills. -FDLMP: see nursing notes -wants STI testing (Hep C if born [birth year]): no -FH breast, colon or ovarian ca: see FH Last mammogram: she declines here - plans to call her gyn doc Last colon cancer screening: reports has cologuard kit at home and not expired, she agrees to complete Breast Ca Risk Assessment: see family history and pt history DEXA (>/= 65): n/a  -Alcohol, Tobacco, drug use: see social history  Review of Systems - no fevers, unintentional weight loss, vision loss, hearing loss, chest pain, sob, hemoptysis, melena, hematochezia, hematuria, genital discharge, changing or concerning skin lesions, bleeding, bruising, loc, thoughts of self harm or SI  Past Medical History:  Diagnosis Date  . ALLERGIC RHINITIS 01/10/2008  . ANXIETY 01/10/2008  . ATTENTION DEFICIT HYPERACTIVITY DISORDER, ADULT 10/28/2009  . BIPOLAR DISORDER UNSPECIFIED 01/10/2008  . CHEST PAIN, ATYPICAL 01/10/2008  . DEPRESSION 01/10/2008  . INSOMNIA 09/17/2010  . PANIC DISORDER 01/10/2008  . PHLEBITIS\T\THROMBOPHLEB SUP VESSELS LOWER EXTREM 07/05/2010  . PLANTAR WART, LEFT 12/29/2008  .  Sebaceous cyst    scalp, seeing general surgeon    Past Surgical History:  Procedure Laterality Date  . inguinal hernia repair     2009  . knee surgery     patella fx, Raliegh Ip  . TONSILLECTOMY      Family History  Problem Relation Age of Onset  . Anxiety disorder Mother   . Heart disease Father        pacemaker, CAD in his 20s    Social History   Socioeconomic History  . Marital status: Divorced    Spouse name: Not on file  . Number of children: Not on file  . Years of education: Not on file  . Highest education level: Not on file  Occupational History  . Not on file  Social Needs  . Financial resource strain: Not on file  . Food insecurity:    Worry: Not on file    Inability: Not on file  . Transportation needs:    Medical: Not on file    Non-medical: Not on file  Tobacco Use  . Smoking status: Never Smoker  . Smokeless tobacco: Never Used  Substance and Sexual Activity  . Alcohol use: Yes    Alcohol/week: 2.0 standard drinks    Types: 2 Standard drinks or equivalent per week    Comment: rare  . Drug use: No    Comment: Caffiene-1 cup 3 times a week.  [name] Sexual activity: Yes    Partners: Female  Lifestyle  . Physical activity:    Days per week: Not on file    Minutes per session: Not on file  . Stress: Not on file  Relationships  . Social connections:  Talks on phone: Not on file    Gets together: Not on file    Attends religious service: Not on file    Active member of club or organization: Not on file    Attends meetings of clubs or organizations: Not on file    Relationship status: Not on file  Other Topics Concern  . 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  clonazePAM (KLONOPIN) 0.5 MG tablet, TAKE ONE TABLET (0.5 MG DOSE) BY MOUTH DAILY AS NEEDED FOR ANXIETY. 90 DAY SUPPLY, Disp: , Rfl: 0 .  lamoTRIgine (LAMICTAL PO), Take 75 mg by mouth daily., Disp: , Rfl:   EXAM:  Vitals:   12/31/18 0808  BP: 100/72  Pulse: 71  Temp: 98.1 F (36.7 C)   Body mass index is 32.61 kg/m.  GENERAL: vitals reviewed and listed below, alert, oriented, appears well hydrated and in no acute distress  HEENT: head atraumatic, PERRLA, normal appearance of eyes, ears, nose and mouth. moist mucus membranes.  NECK: supple, no masses or lymphadenopathy  LUNGS: clear to auscultation bilaterally, no rales, rhonchi or wheeze  CV: HRRR, no peripheral edema or cyanosis, normal pedal pulses  ABDOMEN: bowel sounds normal, soft, non tender to palpation, no masses, no rebound or guarding  GU/BREAST: declined, does with gyn  SKIN: declined for skin exam - plans to see dermatology  MS: normal gait, moves all extremities normally  NEURO: normal gait, speech and thought processing grossly intact, muscle tone grossly intact throughout  PSYCH: normal affect, pleasant and cooperative  ASSESSMENT AND PLAN:  Discussed the following assessment and plan:  PREVENTIVE EXAM: -Discussed and advised all [place] preventive services health task force level A and B recommendations for age, sex and risks. -Advised at least 150 minutes of exercise per week and a healthy diet with avoidance of (less then 1 serving per week) processed foods, white starches, red meat, fast foods and sweets and consisting of: * 5-9 servings of fresh fruits and vegetables (not corn or potatoes) *nuts and seeds, beans *olives and olive oil *lean meats such as fish and white chicken  *whole grains -labs, studies and vaccines per orders this encounter -she  agrees to complete cologuard at home -she declined pap/mammo here - plans to call her gynecologist -tetanus booster today  2. Hyperlipidemia, unspecified hyperlipidemia type - Lipid panel -lifestyle recs  3. Need for hepatitis C screening test - Hepatitis C antibody  4. Obesity (BMI 30.0-34.9) -discussed at length and advised wt reduction -discussed importance of lifestyle commitment to healthy low sugar whole foods based diet and regular exercise - discussed options for breakfast, lung, dinner, etc -[facility] - Hemoglobin A1c   Patient advised to return to clinic immediately if symptoms worsen or persist or new concerns.  Patient Instructions  BEFORE YOU LEAVE: -tetanus booster -labs -follow up: 1 year for CPE  To Do: -complete the cologuard right away for colon cancer screening -call your gynecologist today to set up your  visit and mammogram -call your dermatologist today to set up a skin check -healthy low sugar diet -at least 150 minutes of aerobic exercise per week  We have ordered labs or studies at this visit. It can take up to 1-2 weeks for results and processing. IF results require follow up or explanation, we will call you with instructions. Clinically stable results will be released to your [facility]. If you have not heard from [place] or cannot find your results in [facility] in 2 weeks please contact our office at [phone].  If you are not yet signed up for [facility], please consider signing up.    Preventive Care 40-64 Years, Female Preventive care refers to lifestyle choices and visits with your health care provider that can promote health and wellness. What does preventive care include?   A yearly physical exam. This is also called an annual well check.  Dental exams once or twice a year.  Routine eye exams. Ask your health care provider how often you should have your eyes checked.  Personal lifestyle choices, including: ? Daily care  of your teeth and gums. ? Regular physical activity. ? Eating a healthy diet. ? Avoiding tobacco and drug use. ? Limiting alcohol use. ? Practicing safe sex. ? Taking vitamin and mineral supplements as recommended by your health care provider. What happens during an annual well check? The services and screenings done by your health care provider during your annual well check will depend on your age, overall health, lifestyle risk factors, and family history of disease. Counseling Your health care provider may ask you questions about your:  Alcohol use.  Tobacco use.  Drug use.  Emotional well-being.  Home and relationship well-being.  Sexual activity.  Eating habits.  Work and work Statistician.  Method of birth control.  Menstrual cycle.  Pregnancy history. Screening You may have the following tests or measurements:  Height, weight, and BMI.  Blood pressure.  Lipid and cholesterol levels. These may be checked every 5 years, or more frequently if you are over 80 years old.  Skin check.  Lung cancer screening. You may have this screening every year starting at age 30 if you have a 30-pack-year history of smoking and currently smoke or have quit within the past 15 years.  Colorectal cancer screening. All adults should have this screening starting at age 44 and continuing until age 45. Your health care provider may recommend screening at age 27. You will have tests every 1-10 years, depending on your results and the type of screening test. People at increased risk should start screening at an earlier age. Screening tests may include: ? Guaiac-based fecal occult blood testing. ? Fecal immunochemical test (FIT). ? Stool DNA test. ? Virtual colonoscopy. ? Sigmoidoscopy. During this test, a flexible tube with a tiny camera (sigmoidoscope) is used to examine your rectum and lower colon. The sigmoidoscope is inserted through your anus into your rectum and lower  colon. ? Colonoscopy. During this test, a long, thin, flexible tube with a tiny camera (colonoscope) is used to examine your entire colon and rectum.  Hepatitis C blood test.  Hepatitis B blood test.  Sexually transmitted disease (STD) testing.  Diabetes screening. This is done by checking your blood sugar (glucose) after you have not eaten for a while (fasting). You may have this done every 1-3 years.  Mammogram. This may be done every 1-2 years. Talk to your health care provider about when you should start having regular mammograms. This may depend  on whether you have a family history of breast cancer.  BRCA-related cancer screening. This may be done if you have a family history of breast, ovarian, tubal, or peritoneal cancers.  Pelvic exam and Pap test. This may be done every 3 years starting at age 53. Starting at age 6, this may be done every 5 years if you have a Pap test in combination with an HPV test.  Bone density scan. This is done to screen for osteoporosis. You may have this scan if you are at high risk for osteoporosis. Discuss your test results, treatment options, and if necessary, the need for more tests with your health care provider. Vaccines Your health care provider may recommend certain vaccines, such as:  Influenza vaccine. This is recommended every year.  Tetanus, diphtheria, and acellular pertussis (Tdap, Td) vaccine. You may need a Td booster every 10 years.  Varicella vaccine. You may need this if you have not been vaccinated.  Zoster vaccine. You may need this after age 29.  Measles, mumps, and rubella (MMR) vaccine. You may need at least one dose of MMR if you were born in 1957 or later. You may also need a second dose.  Pneumococcal 13-valent conjugate (PCV13) vaccine. You may need this if you have certain conditions and were not previously vaccinated.  Pneumococcal polysaccharide (PPSV23) vaccine. You may need one or two doses if you smoke cigarettes  or if you have certain conditions.  Meningococcal vaccine. You may need this if you have certain conditions.  Hepatitis A vaccine. You may need this if you have certain conditions or if you travel or work in places where you may be exposed to hepatitis A.  Hepatitis B vaccine. You may need this if you have certain conditions or if you travel or work in places where you may be exposed to hepatitis B.  Haemophilus influenzae type b (Hib) vaccine. You may need this if you have certain conditions. Talk to your health care provider about which screenings and vaccines you need and how often you need them. This information is not intended to replace advice given to you by your health care provider. Make sure you discuss any questions you have with your health care provider. Document Released: 12/11/2015 Document Revised: 01/04/2018 Document Reviewed: 09/15/2015 Elsevier Interactive Patient Education  2019 [employer].         No follow-ups on file.  Lucretia Kern, DO

## 2018-12-30 NOTE — Progress Notes (Signed)
[facility] PREP Progress Report   Patient Details  Name: [name] MRN: [id number] Date of Birth: 1966/10/31 Age: 53 y.o. PCP: [name], DO  Vitals:   12/30/18 2222  BP: 140/88  Pulse: 66  Resp: 18  SpO2: (!) 7%  Weight: 208 lb (94.3 kg)     [facility] Eval - 12/25/18 2223      Referral    Referring Provider  self/Dr. Selena Batten    Reason for referral  Obesitity/Overweight;Inactivity    Program Start Date  12/26/18      Measurement   Waist Circumference  40 inches    Hip Circumference  48 inches    Body fat  32.8 percent      Information for Trainer   Goals  "to lose 15lbs, to be on a regular healthy pattern of working out and eating healthy"    Current Exercise  walking dog most days    Current Barriers  motivation      Past Medical History:  Diagnosis Date  . ALLERGIC RHINITIS 01/10/2008  . ANXIETY 01/10/2008  . ATTENTION DEFICIT HYPERACTIVITY DISORDER, ADULT 10/28/2009  . BIPOLAR DISORDER UNSPECIFIED 01/10/2008  . CHEST PAIN, ATYPICAL 01/10/2008  . DEPRESSION 01/10/2008  . INSOMNIA 09/17/2010  . PANIC DISORDER 01/10/2008  . PHLEBITIS\T\THROMBOPHLEB SUP VESSELS LOWER EXTREM 07/05/2010  . PLANTAR WART, LEFT 12/29/2008  . Sebaceous cyst    scalp, seeing general surgeon   Past Surgical History:  Procedure Laterality Date  . inguinal hernia repair     2009  . knee surgery     patella fx, Delbert Harness  . TONSILLECTOMY     Social History   Tobacco Use  Smoking Status Never Smoker  Smokeless Tobacco Never Used     Ms. Eike has registered and begun the 12-week, twice weekly PREP at the [facility].  She will have [facility] access to all the [facility] in [facility] to the class days.    Rose Fillers 12/30/2018, 10:25 PM

## 2018-12-31 ENCOUNTER — Encounter: Payer: Self-pay | Admitting: Family Medicine

## 2018-12-31 ENCOUNTER — Ambulatory Visit (INDEPENDENT_AMBULATORY_CARE_PROVIDER_SITE_OTHER): Payer: Medicare HMO | Admitting: Family Medicine

## 2018-12-31 VITALS — BP 100/72 | HR 71 | Temp 98.1°F | Ht 67.5 in | Wt 211.3 lb

## 2018-12-31 DIAGNOSIS — E669 Obesity, unspecified: Secondary | ICD-10-CM | POA: Diagnosis not present

## 2018-12-31 DIAGNOSIS — E785 Hyperlipidemia, unspecified: Secondary | ICD-10-CM | POA: Diagnosis not present

## 2018-12-31 DIAGNOSIS — Z0000 Encounter for general adult medical examination without abnormal findings: Secondary | ICD-10-CM | POA: Diagnosis not present

## 2018-12-31 DIAGNOSIS — Z23 Encounter for immunization: Secondary | ICD-10-CM | POA: Diagnosis not present

## 2018-12-31 DIAGNOSIS — R69 Illness, unspecified: Secondary | ICD-10-CM | POA: Diagnosis not present

## 2018-12-31 DIAGNOSIS — Z1159 Encounter for screening for other viral diseases: Secondary | ICD-10-CM

## 2018-12-31 LAB — LIPID PANEL
CHOL/HDL RATIO: 4
Cholesterol: 257 mg/dL — ABNORMAL HIGH (ref 0–200)
HDL: 67.3 mg/dL (ref >39.00)
LDL Cholesterol: 164 mg/dL — ABNORMAL HIGH (ref 0–99)
NONHDL: 189.55
Triglycerides: 128 mg/dL (ref 0.0–149.0)
VLDL: 25.6 mg/dL (ref 0.0–40.0)

## 2018-12-31 LAB — HEMOGLOBIN A1C: Hgb A1c MFr Bld: 5.8 % (ref 4.6–6.5)

## 2018-12-31 NOTE — Telephone Encounter (Signed)
Patient seen in the office today for her physical and form completed.

## 2018-12-31 NOTE — Patient Instructions (Signed)
BEFORE YOU LEAVE: -tetanus booster -labs -follow up: 1 year for CPE  To Do: -complete the cologuard right away for colon cancer screening -call your gynecologist today to set up your visit and mammogram -call your dermatologist today to set up a skin check -healthy low sugar diet -at least 150 minutes of aerobic exercise per week  We have ordered labs or studies at this visit. It can take up to 1-2 weeks for results and processing. IF results require follow up or explanation, we will call you with instructions. Clinically stable results will be released to your [facility]. If you have not heard from [place] or cannot find your results in [facility] in 2 weeks please contact our office at [phone].  If you are not yet signed up for [facility], please consider signing up.    Preventive Care 40-64 Years, Female Preventive care refers to lifestyle choices and visits with your health care provider that can promote health and wellness. What does preventive care include?   A yearly physical exam. This is also called an annual well check.  Dental exams once or twice a year.  Routine eye exams. Ask your health care provider how often you should have your eyes checked.  Personal lifestyle choices, including: ? Daily care of your teeth and gums. ? Regular physical activity. ? Eating a healthy diet. ? Avoiding tobacco and drug use. ? Limiting alcohol use. ? Practicing safe sex. ? Taking vitamin and mineral supplements as recommended by your health care provider. What happens during an annual well check? The services and screenings done by your health care provider during your annual well check will depend on your age, overall health, lifestyle risk factors, and family history of disease. Counseling Your health care provider may ask you questions about your:  Alcohol use.  Tobacco use.  Drug use.  Emotional well-being.  Home and relationship well-being.  Sexual activity.  Eating  habits.  Work and work Statistician.  Method of birth control.  Menstrual cycle.  Pregnancy history. Screening You may have the following tests or measurements:  Height, weight, and BMI.  Blood pressure.  Lipid and cholesterol levels. These may be checked every 5 years, or more frequently if you are over 73 years old.  Skin check.  Lung cancer screening. You may have this screening every year starting at age 74 if you have a 30-pack-year history of smoking and currently smoke or have quit within the past 15 years.  Colorectal cancer screening. All adults should have this screening starting at age 50 and continuing until age 16. Your health care provider may recommend screening at age 74. You will have tests every 1-10 years, depending on your results and the type of screening test. People at increased risk should start screening at an earlier age. Screening tests may include: ? Guaiac-based fecal occult blood testing. ? Fecal immunochemical test (FIT). ? Stool DNA test. ? Virtual colonoscopy. ? Sigmoidoscopy. During this test, a flexible tube with a tiny camera (sigmoidoscope) is used to examine your rectum and lower colon. The sigmoidoscope is inserted through your anus into your rectum and lower colon. ? Colonoscopy. During this test, a long, thin, flexible tube with a tiny camera (colonoscope) is used to examine your entire colon and rectum.  Hepatitis C blood test.  Hepatitis B blood test.  Sexually transmitted disease (STD) testing.  Diabetes screening. This is done by checking your blood sugar (glucose) after you have not eaten for a while (fasting). You may have  this done every 1-3 years.  Mammogram. This may be done every 1-2 years. Talk to your health care provider about when you should start having regular mammograms. This may depend on whether you have a family history of breast cancer.  BRCA-related cancer screening. This may be done if you have a family history of  breast, ovarian, tubal, or peritoneal cancers.  Pelvic exam and Pap test. This may be done every 3 years starting at age 60. Starting at age 64, this may be done every 5 years if you have a Pap test in combination with an HPV test.  Bone density scan. This is done to screen for osteoporosis. You may have this scan if you are at high risk for osteoporosis. Discuss your test results, treatment options, and if necessary, the need for more tests with your health care provider. Vaccines Your health care provider may recommend certain vaccines, such as:  Influenza vaccine. This is recommended every year.  Tetanus, diphtheria, and acellular pertussis (Tdap, Td) vaccine. You may need a Td booster every 10 years.  Varicella vaccine. You may need this if you have not been vaccinated.  Zoster vaccine. You may need this after age 47.  Measles, mumps, and rubella (MMR) vaccine. You may need at least one dose of MMR if you were born in 1957 or later. You may also need a second dose.  Pneumococcal 13-valent conjugate (PCV13) vaccine. You may need this if you have certain conditions and were not previously vaccinated.  Pneumococcal polysaccharide (PPSV23) vaccine. You may need one or two doses if you smoke cigarettes or if you have certain conditions.  Meningococcal vaccine. You may need this if you have certain conditions.  Hepatitis A vaccine. You may need this if you have certain conditions or if you travel or work in places where you may be exposed to hepatitis A.  Hepatitis B vaccine. You may need this if you have certain conditions or if you travel or work in places where you may be exposed to hepatitis B.  Haemophilus influenzae type b (Hib) vaccine. You may need this if you have certain conditions. Talk to your health care provider about which screenings and vaccines you need and how often you need them. This information is not intended to replace advice given to you by your health care  provider. Make sure you discuss any questions you have with your health care provider. Document Released: 12/11/2015 Document Revised: 01/04/2018 Document Reviewed: 09/15/2015 Elsevier Interactive Patient Education  2019 [employer].

## 2019-01-01 ENCOUNTER — Encounter: Payer: Medicare HMO | Admitting: Family Medicine

## 2019-01-02 NOTE — Progress Notes (Signed)
[facility] PREP Progress Report   Patient Details  Name: [name] MRN: [id number] Date of Birth: 21-Dec-1965 Age: 53 y.o. PCP: [name], DO  Vitals:   01/02/19 2018  Weight: 210 lb 6.4 oz (95.4 kg)      Past Medical History:  Diagnosis Date  . ALLERGIC RHINITIS 01/10/2008  . ANXIETY 01/10/2008  . ATTENTION DEFICIT HYPERACTIVITY DISORDER, ADULT 10/28/2009  . BIPOLAR DISORDER UNSPECIFIED 01/10/2008  . CHEST PAIN, ATYPICAL 01/10/2008  . DEPRESSION 01/10/2008  . INSOMNIA 09/17/2010  . PANIC DISORDER 01/10/2008  . PHLEBITIS\T\THROMBOPHLEB SUP VESSELS LOWER EXTREM 07/05/2010  . PLANTAR WART, LEFT 12/29/2008  . Sebaceous cyst    scalp, seeing general surgeon   Past Surgical History:  Procedure Laterality Date  . inguinal hernia repair     2009  . knee surgery     patella fx, Delbert Harness  . TONSILLECTOMY     Social History   Tobacco Use  Smoking Status Never Smoker  Smokeless Tobacco Never Used   Fun things you did since last meeting:"spent time w/a friend" Things you are grateful for:"my daughter" Nutrition celebrations:"made mushrooms & broccoli this morning" Barriers:"sticken thinking that "I can't"   Rose Fillers 01/02/2019, 8:19 PM

## 2019-01-04 LAB — HEPATITIS C ANTIBODY
Hepatitis C Ab: NONREACTIVE
SIGNAL TO CUT-OFF: 0.04 (ref <1.00)

## 2019-01-09 NOTE — Progress Notes (Signed)
[facility] PREP Weekly Session   Patient Details  Name: [name] MRN: [id number] Date of Birth: 1966-03-01 Age: 53 y.o. PCP: [name], DO  Vitals:   01/09/19 1706  Weight: 208 lb 12.8 oz (94.7 kg)    [facility] Weekly seesion - 01/09/19 1700      Weekly Session   Topic Discussed  Healthy eating tips    Minutes exercised this week  20 minutes   cardio     Fun things you did since last meeting:"Spent 5 days w/Lola" Things you are grateful for:"my family" Nutrition celebrations for the week:"I picked up a magazine on keto diet" Barriers:" lazy and not pushing myself to come swim during the week"  Rose Fillers 01/09/2019, 5:07 PM

## 2019-01-14 DIAGNOSIS — R69 Illness, unspecified: Secondary | ICD-10-CM | POA: Diagnosis not present

## 2019-01-16 NOTE — Progress Notes (Signed)
[facility] PREP Weekly Session   Patient Details  Name: [name] MRN: [id number] Date of Birth: Dec 21, 1965 Age: 53 y.o. PCP: [name], DO  Vitals:   01/16/19 2017  Weight: 208 lb (94.3 kg)    [facility] Weekly seesion - 01/16/19 2000      Weekly Session   Topic Discussed  Health habits    Minutes exercised this week  20 minutes   cardio     Fun things you did since last meeting:"went to a play" Things you are grateful for:"self" Nutrition celebrations:"less snacks" Barriers:"Lack of initiative"    Rose Fillers 01/16/2019, 8:17 PM

## 2019-02-11 DIAGNOSIS — R69 Illness, unspecified: Secondary | ICD-10-CM | POA: Diagnosis not present

## 2019-03-04 DIAGNOSIS — R69 Illness, unspecified: Secondary | ICD-10-CM | POA: Diagnosis not present

## 2019-03-11 DIAGNOSIS — R69 Illness, unspecified: Secondary | ICD-10-CM | POA: Diagnosis not present

## 2019-03-18 DIAGNOSIS — R69 Illness, unspecified: Secondary | ICD-10-CM | POA: Diagnosis not present

## 2019-03-18 DIAGNOSIS — F4310 Post-traumatic stress disorder, unspecified: Secondary | ICD-10-CM | POA: Diagnosis not present

## 2019-03-25 DIAGNOSIS — R69 Illness, unspecified: Secondary | ICD-10-CM | POA: Diagnosis not present

## 2019-04-02 DIAGNOSIS — R69 Illness, unspecified: Secondary | ICD-10-CM | POA: Diagnosis not present

## 2019-04-08 DIAGNOSIS — R69 Illness, unspecified: Secondary | ICD-10-CM | POA: Diagnosis not present

## 2019-04-15 DIAGNOSIS — R69 Illness, unspecified: Secondary | ICD-10-CM | POA: Diagnosis not present

## 2019-04-29 DIAGNOSIS — R69 Illness, unspecified: Secondary | ICD-10-CM | POA: Diagnosis not present

## 2019-05-09 DIAGNOSIS — H10412 Chronic giant papillary conjunctivitis, left eye: Secondary | ICD-10-CM | POA: Diagnosis not present

## 2019-05-13 DIAGNOSIS — R69 Illness, unspecified: Secondary | ICD-10-CM | POA: Diagnosis not present

## 2019-05-27 DIAGNOSIS — R69 Illness, unspecified: Secondary | ICD-10-CM | POA: Diagnosis not present

## 2019-05-28 DIAGNOSIS — F4310 Post-traumatic stress disorder, unspecified: Secondary | ICD-10-CM | POA: Diagnosis not present

## 2019-05-28 DIAGNOSIS — R69 Illness, unspecified: Secondary | ICD-10-CM | POA: Diagnosis not present

## 2019-06-06 DIAGNOSIS — R69 Illness, unspecified: Secondary | ICD-10-CM | POA: Diagnosis not present

## 2019-06-10 DIAGNOSIS — R69 Illness, unspecified: Secondary | ICD-10-CM | POA: Diagnosis not present

## 2019-06-11 DIAGNOSIS — H10413 Chronic giant papillary conjunctivitis, bilateral: Secondary | ICD-10-CM | POA: Diagnosis not present

## 2019-06-11 DIAGNOSIS — H5203 Hypermetropia, bilateral: Secondary | ICD-10-CM | POA: Diagnosis not present

## 2019-07-01 DIAGNOSIS — R69 Illness, unspecified: Secondary | ICD-10-CM | POA: Diagnosis not present

## 2019-07-15 DIAGNOSIS — R69 Illness, unspecified: Secondary | ICD-10-CM | POA: Diagnosis not present

## 2019-07-18 DIAGNOSIS — Z124 Encounter for screening for malignant neoplasm of cervix: Secondary | ICD-10-CM | POA: Diagnosis not present

## 2019-07-18 DIAGNOSIS — Z1231 Encounter for screening mammogram for malignant neoplasm of breast: Secondary | ICD-10-CM | POA: Diagnosis not present

## 2019-07-18 DIAGNOSIS — Z01419 Encounter for gynecological examination (general) (routine) without abnormal findings: Secondary | ICD-10-CM | POA: Diagnosis not present

## 2019-07-18 LAB — HM PAP SMEAR: HM Pap smear: NEGATIVE

## 2019-07-22 DIAGNOSIS — R69 Illness, unspecified: Secondary | ICD-10-CM | POA: Diagnosis not present

## 2019-08-01 DIAGNOSIS — R69 Illness, unspecified: Secondary | ICD-10-CM | POA: Diagnosis not present

## 2019-08-12 DIAGNOSIS — R69 Illness, unspecified: Secondary | ICD-10-CM | POA: Diagnosis not present

## 2019-08-19 DIAGNOSIS — R69 Illness, unspecified: Secondary | ICD-10-CM | POA: Diagnosis not present

## 2019-08-27 DIAGNOSIS — R69 Illness, unspecified: Secondary | ICD-10-CM | POA: Diagnosis not present

## 2019-08-27 DIAGNOSIS — F4310 Post-traumatic stress disorder, unspecified: Secondary | ICD-10-CM | POA: Diagnosis not present

## 2019-09-02 DIAGNOSIS — R69 Illness, unspecified: Secondary | ICD-10-CM | POA: Diagnosis not present

## 2019-09-16 DIAGNOSIS — R69 Illness, unspecified: Secondary | ICD-10-CM | POA: Diagnosis not present

## 2019-09-30 DIAGNOSIS — R69 Illness, unspecified: Secondary | ICD-10-CM | POA: Diagnosis not present

## 2019-10-23 DIAGNOSIS — R69 Illness, unspecified: Secondary | ICD-10-CM | POA: Diagnosis not present

## 2019-10-28 ENCOUNTER — Encounter: Payer: Self-pay | Admitting: Family Medicine

## 2019-10-28 ENCOUNTER — Other Ambulatory Visit: Payer: Self-pay

## 2019-10-28 ENCOUNTER — Ambulatory Visit (INDEPENDENT_AMBULATORY_CARE_PROVIDER_SITE_OTHER): Payer: Medicare HMO | Admitting: Family Medicine

## 2019-10-28 VITALS — BP 102/60 | HR 77 | Temp 96.7°F | Wt 203.1 lb

## 2019-10-28 DIAGNOSIS — R5383 Other fatigue: Secondary | ICD-10-CM | POA: Diagnosis not present

## 2019-10-28 DIAGNOSIS — E785 Hyperlipidemia, unspecified: Secondary | ICD-10-CM | POA: Diagnosis not present

## 2019-10-28 DIAGNOSIS — R69 Illness, unspecified: Secondary | ICD-10-CM | POA: Diagnosis not present

## 2019-10-28 DIAGNOSIS — F319 Bipolar disorder, unspecified: Secondary | ICD-10-CM

## 2019-10-28 DIAGNOSIS — R739 Hyperglycemia, unspecified: Secondary | ICD-10-CM

## 2019-10-28 NOTE — Progress Notes (Signed)
[name] DOB: 1966-10-24 Encounter date: 10/28/2019  This isa 53 y.o. female who presents to establish care. Chief Complaint  Patient presents with  . Transfer of Care    History of present illness:  Last seen by Dr. Selena Batten in February 2020.  Had Cologuard at that time, but I do not see results present. She states she has it just hasn't done it.   Suggested repeat blood work due to elevated cholesterol and slight elevation in blood sugar at last visit; didn't complete yet.  Follows with gynecology yearly for routine care, mammogram, Pap smear.  Follows with psychiatry for management of bipolar disorder PTSD and panic disorder. Doing well with mood management. Also sees counselor, [name].   Insomnia: sleep is sometimes still an issue for her. Uses trazodone sometimes which does help. Thinks maybe it causes constipation.   Hasn't been bothered by allergies this year.   Past Medical History:  Diagnosis Date  . ALLERGIC RHINITIS 01/10/2008  . ANXIETY 01/10/2008  . ATTENTION DEFICIT HYPERACTIVITY DISORDER, ADULT 10/28/2009  . BIPOLAR DISORDER UNSPECIFIED 01/10/2008  . CHEST PAIN, ATYPICAL 01/10/2008  . DEPRESSION 01/10/2008  . INSOMNIA 09/17/2010  . PANIC DISORDER 01/10/2008  . PHLEBITIS\T\THROMBOPHLEB SUP VESSELS LOWER EXTREM 07/05/2010  . PLANTAR WART, LEFT 12/29/2008  . Sebaceous cyst    scalp, seeing general surgeon   Past Surgical History:  Procedure Laterality Date  . inguinal hernia repair Right    2009  . knee surgery     patella fx, Delbert Harness  . TONSILLECTOMY     No Known Allergies Current Meds  Medication Sig  . clonazePAM (KLONOPIN) 0.5 MG tablet TAKE ONE TABLET (0.5 MG DOSE) BY MOUTH DAILY AS NEEDED FOR ANXIETY. 90 DAY SUPPLY  . lamoTRIgine (LAMICTAL PO) Take 75 mg by mouth daily.  . traZODone (DESYREL) 25 mg TABS tablet Take 75 mg by mouth at bedtime.   Social History   Tobacco Use  . Smoking status: Never Smoker  . Smokeless tobacco: Never Used   Substance Use Topics  . Alcohol use: Yes    Alcohol/week: 2.0 standard drinks    Types: 2 Standard drinks or equivalent per week    Comment: daily   Family History  Problem Relation Age of Onset  . Anxiety disorder Mother   . Heart disease Father        pacemaker, CAD in his 30s  . Alzheimer's disease Paternal Grandmother   . Heart attack Paternal Grandfather      Review of Systems  Constitutional: Negative for chills, fatigue and fever.  Respiratory: Negative for cough, chest tightness, shortness of breath and wheezing.   Cardiovascular: Negative for chest pain, palpitations and leg swelling.    Objective:  BP 102/60 (BP Location: Left Arm, Patient Position: Sitting, Cuff Size: Large)   Pulse 77   Temp (!) 96.7 F (35.9 C) (Temporal)   Wt 203 lb 1.6 oz (92.1 kg)   LMP 09/16/2016 (Exact Date)   SpO2 98%   BMI 31.34 kg/m   Weight: 203 lb 1.6 oz (92.1 kg)   BP Readings from Last 3 Encounters:  10/28/19 102/60  12/31/18 100/72  12/30/18 140/88   Wt Readings from Last 3 Encounters:  10/28/19 203 lb 1.6 oz (92.1 kg)  01/16/19 208 lb (94.3 kg)  01/09/19 208 lb 12.8 oz (94.7 kg)    Physical Exam Constitutional:      General: She is not in acute distress.    Appearance: She is well-developed.  Cardiovascular:  Rate and Rhythm: Normal rate and regular rhythm.     Heart sounds: Murmur present. Systolic (most noted over tricuspid) murmur present with a grade of 2/6. No friction rub.  Pulmonary:     Effort: Pulmonary effort is normal. No respiratory distress.     Breath sounds: Normal breath sounds. No wheezing or rales.  Musculoskeletal:     Right lower leg: No edema.     Left lower leg: No edema.  Neurological:     Mental Status: She is alert and oriented to person, place, and time.  Psychiatric:        Attention and Perception: Attention normal.        Mood and Affect: Mood normal.        Speech: Speech normal.        Behavior: Behavior normal.         Thought Content: Thought content normal.     Assessment/Plan:  1. Hyperlipidemia, unspecified hyperlipidemia type We discussed importance of lifestyle changes to help with cholesterol.  She has been drinking a little bit more alcohol and has gained weight.  She does feel like she can improve with both of these behaviors.  We discussed importance of daily exercise as well.  She is planning to work on changing these behaviors (she had lost over 20 pounds last year) I will get her blood work prior to physical in February or March. - Comprehensive metabolic panel; Future - TSH; Future - Lipid panel; Future  2. Hyperglycemia See above.  We discussed importance of regular exercise as well as cutting back on carbohydrates in her diet. - Comprehensive metabolic panel; Future - Hemoglobin A1c; Future  3. Fatigue, unspecified type - CBC with Differential/Platelet; Future  4. Bipolar affective disorder, remission status unspecified ([place]) Well-controlled with current medications.  She feels very comfortable with current psychiatrist as well as Social worker.   Return in about 3 months (around 01/26/2020) for physical exam.  Micheline Rough, MD

## 2019-11-01 ENCOUNTER — Ambulatory Visit: Payer: Medicare HMO | Admitting: Family Medicine

## 2019-11-04 DIAGNOSIS — R69 Illness, unspecified: Secondary | ICD-10-CM | POA: Diagnosis not present

## 2019-11-11 DIAGNOSIS — R69 Illness, unspecified: Secondary | ICD-10-CM | POA: Diagnosis not present

## 2019-12-02 DIAGNOSIS — R69 Illness, unspecified: Secondary | ICD-10-CM | POA: Diagnosis not present

## 2019-12-16 DIAGNOSIS — R69 Illness, unspecified: Secondary | ICD-10-CM | POA: Diagnosis not present

## 2020-01-01 DIAGNOSIS — R69 Illness, unspecified: Secondary | ICD-10-CM | POA: Diagnosis not present

## 2020-01-07 DIAGNOSIS — R69 Illness, unspecified: Secondary | ICD-10-CM | POA: Diagnosis not present

## 2020-01-07 DIAGNOSIS — F4310 Post-traumatic stress disorder, unspecified: Secondary | ICD-10-CM | POA: Diagnosis not present

## 2020-01-08 DIAGNOSIS — R69 Illness, unspecified: Secondary | ICD-10-CM | POA: Diagnosis not present

## 2020-01-14 ENCOUNTER — Other Ambulatory Visit: Payer: Self-pay

## 2020-01-15 ENCOUNTER — Other Ambulatory Visit: Payer: Medicare HMO

## 2020-01-15 ENCOUNTER — Other Ambulatory Visit (INDEPENDENT_AMBULATORY_CARE_PROVIDER_SITE_OTHER): Payer: Medicare HMO

## 2020-01-15 DIAGNOSIS — R739 Hyperglycemia, unspecified: Secondary | ICD-10-CM

## 2020-01-15 DIAGNOSIS — R5383 Other fatigue: Secondary | ICD-10-CM

## 2020-01-15 DIAGNOSIS — E785 Hyperlipidemia, unspecified: Secondary | ICD-10-CM

## 2020-01-15 LAB — CBC WITH DIFFERENTIAL/PLATELET
Basophils Absolute: 0 10*3/uL (ref 0.0–0.1)
Basophils Relative: 0.8 % (ref 0.0–3.0)
Eosinophils Absolute: 0.2 10*3/uL (ref 0.0–0.7)
Eosinophils Relative: 4.6 % (ref 0.0–5.0)
HCT: 41 % (ref 36.0–46.0)
Hemoglobin: 13.7 g/dL (ref 12.0–15.0)
Lymphocytes Relative: 33.6 % (ref 12.0–46.0)
Lymphs Abs: 1.5 10*3/uL (ref 0.7–4.0)
MCHC: 33.3 g/dL (ref 30.0–36.0)
MCV: 93.6 fl (ref 78.0–100.0)
Monocytes Absolute: 0.6 10*3/uL (ref 0.1–1.0)
Monocytes Relative: 13.8 % — ABNORMAL HIGH (ref 3.0–12.0)
Neutro Abs: 2.1 10*3/uL (ref 1.4–7.7)
Neutrophils Relative %: 47.2 % (ref 43.0–77.0)
Platelets: 241 10*3/uL (ref 150.0–400.0)
RBC: 4.38 Mil/uL (ref 3.87–5.11)
RDW: 13.9 % (ref 11.5–15.5)
WBC: 4.5 10*3/uL (ref 4.0–10.5)

## 2020-01-15 LAB — COMPREHENSIVE METABOLIC PANEL
ALT: 19 U/L (ref 0–35)
AST: 19 U/L (ref 0–37)
Albumin: 4.4 g/dL (ref 3.5–5.2)
Alkaline Phosphatase: 61 U/L (ref 39–117)
BUN: 19 mg/dL (ref 6–23)
CO2: 31 mEq/L (ref 19–32)
Calcium: 9.4 mg/dL (ref 8.4–10.5)
Chloride: 102 mEq/L (ref 96–112)
Creatinine, Ser: 0.88 mg/dL (ref 0.40–1.20)
GFR: 66.49 mL/min (ref >60.00)
Glucose, Bld: 92 mg/dL (ref 70–99)
Potassium: 4.8 mEq/L (ref 3.5–5.1)
Sodium: 139 mEq/L (ref 135–145)
Total Bilirubin: 0.6 mg/dL (ref 0.2–1.2)
Total Protein: 6.6 g/dL (ref 6.0–8.3)

## 2020-01-15 LAB — TSH: TSH: 2.52 u[IU]/mL (ref 0.35–4.50)

## 2020-01-15 LAB — LIPID PANEL
Cholesterol: 242 mg/dL — ABNORMAL HIGH (ref 0–200)
HDL: 71.1 mg/dL (ref >39.00)
LDL Cholesterol: 145 mg/dL — ABNORMAL HIGH (ref 0–99)
NonHDL: 170.68
Total CHOL/HDL Ratio: 3
Triglycerides: 129 mg/dL (ref 0.0–149.0)
VLDL: 25.8 mg/dL (ref 0.0–40.0)

## 2020-01-15 LAB — HEMOGLOBIN A1C: Hgb A1c MFr Bld: 6.4 % (ref 4.6–6.5)

## 2020-01-20 DIAGNOSIS — R69 Illness, unspecified: Secondary | ICD-10-CM | POA: Diagnosis not present

## 2020-01-21 ENCOUNTER — Other Ambulatory Visit: Payer: Self-pay

## 2020-01-22 ENCOUNTER — Telehealth: Payer: Self-pay | Admitting: *Deleted

## 2020-01-22 ENCOUNTER — Encounter: Payer: Self-pay | Admitting: Family Medicine

## 2020-01-22 ENCOUNTER — Ambulatory Visit (INDEPENDENT_AMBULATORY_CARE_PROVIDER_SITE_OTHER): Payer: Medicare HMO | Admitting: Family Medicine

## 2020-01-22 VITALS — BP 116/82 | HR 78 | Temp 97.2°F | Ht 67.5 in | Wt 211.4 lb

## 2020-01-22 DIAGNOSIS — R739 Hyperglycemia, unspecified: Secondary | ICD-10-CM

## 2020-01-22 DIAGNOSIS — F319 Bipolar disorder, unspecified: Secondary | ICD-10-CM

## 2020-01-22 DIAGNOSIS — Z0000 Encounter for general adult medical examination without abnormal findings: Secondary | ICD-10-CM

## 2020-01-22 DIAGNOSIS — Z1211 Encounter for screening for malignant neoplasm of colon: Secondary | ICD-10-CM | POA: Diagnosis not present

## 2020-01-22 DIAGNOSIS — R69 Illness, unspecified: Secondary | ICD-10-CM | POA: Diagnosis not present

## 2020-01-22 NOTE — Progress Notes (Signed)
[name] DOB: 1966/04/06 Encounter date: 01/22/2020  This is a 54 y.o. female who presents for complete physical   History of present illness/Additional concerns: Cologuard expired; so needs another one.   Has been exercising at least 3 days/week. Strength training and cardio.   Bipolar disorder, PTSD, panic disorder: follows with Dr. Sunday Shams at [facility] psychiatry  Follows with gyn for mammogram, pap  Also follows with Dr. Margaretha Sheffield for functional medicine.   Past Medical History:  Diagnosis Date  . ALLERGIC RHINITIS 01/10/2008  . ANXIETY 01/10/2008  . ATTENTION DEFICIT HYPERACTIVITY DISORDER, ADULT 10/28/2009  . BIPOLAR DISORDER UNSPECIFIED 01/10/2008  . CHEST PAIN, ATYPICAL 01/10/2008  . DEPRESSION 01/10/2008  . INSOMNIA 09/17/2010  . PANIC DISORDER 01/10/2008  . PHLEBITIS\T\THROMBOPHLEB SUP VESSELS LOWER EXTREM 07/05/2010  . PLANTAR WART, LEFT 12/29/2008  . Sebaceous cyst    scalp, seeing general surgeon   Past Surgical History:  Procedure Laterality Date  . inguinal hernia repair Right    2009  . knee surgery     patella fx, Delbert Harness  . TONSILLECTOMY     No Known Allergies No outpatient medications have been marked as taking for the 01/22/20 encounter (Office Visit) with Wynn Banker, MD.   Social History   Tobacco Use  . Smoking status: Never Smoker  . Smokeless tobacco: Never Used  Substance Use Topics  . Alcohol use: Yes    Alcohol/week: 2.0 standard drinks    Types: 2 Standard drinks or equivalent per week    Comment: daily   Family History  Problem Relation Age of Onset  . Anxiety disorder Mother   . Heart disease Father        pacemaker, CAD in his 32s  . Alzheimer's disease Paternal Grandmother   . Heart attack Paternal Grandfather      Review of Systems  Constitutional: Negative for activity change, appetite change, chills, fatigue, fever and unexpected weight change.  HENT: Negative for congestion, ear pain, hearing loss, sinus  pressure, sinus pain, sore throat and trouble swallowing.   Eyes: Negative for pain and visual disturbance.  Respiratory: Negative for cough, chest tightness, shortness of breath and wheezing.   Cardiovascular: Negative for chest pain, palpitations and leg swelling.  Gastrointestinal: Negative for abdominal pain, blood in stool, constipation, diarrhea, nausea and vomiting.  Genitourinary: Negative for difficulty urinating and menstrual problem.  Musculoskeletal: Negative for arthralgias and back pain.  Skin: Negative for rash.  Neurological: Negative for dizziness, weakness, numbness and headaches.  Hematological: Negative for adenopathy. Does not bruise/bleed easily.  Psychiatric/Behavioral: Negative for sleep disturbance and suicidal ideas. The patient is not nervous/anxious.     CBC:  Lab Results  Component Value Date   WBC 4.5 01/15/2020   HGB 13.7 01/15/2020   HCT 41.0 01/15/2020   MCH 27.4 05/31/2011   MCHC 33.3 01/15/2020   RDW 13.9 01/15/2020   PLT 241.0 01/15/2020   CMP: Lab Results  Component Value Date   NA 139 01/15/2020   K 4.8 01/15/2020   CL 102 01/15/2020   CO2 31 01/15/2020   GLUCOSE 92 01/15/2020   BUN 19 01/15/2020   CREATININE 0.88 01/15/2020   GFRAA >90 10/23/2011   CALCIUM 9.4 01/15/2020   PROT 6.6 01/15/2020   BILITOT 0.6 01/15/2020   ALKPHOS 61 01/15/2020   ALT 19 01/15/2020   AST 19 01/15/2020   LIPID: Lab Results  Component Value Date   CHOL 242 (H) 01/15/2020   TRIG 129.0 01/15/2020  HDL 71.10 01/15/2020   LDLCALC 145 (H) 01/15/2020    Objective:  BP 116/82 (BP Location: Left Arm, Patient Position: Sitting, Cuff Size: Large)   Pulse 78   Temp (!) 97.2 F (36.2 C) (Temporal)   Ht 5' 7.5" (1.715 m)   Wt 211 lb 6.4 oz (95.9 kg)   LMP 09/16/2016 (Exact Date)   SpO2 99%   BMI 32.62 kg/m   Weight: 211 lb 6.4 oz (95.9 kg)   BP Readings from Last 3 Encounters:  01/22/20 116/82  10/28/19 102/60  12/31/18 100/72   Wt Readings from  Last 3 Encounters:  01/22/20 211 lb 6.4 oz (95.9 kg)  10/28/19 203 lb 1.6 oz (92.1 kg)  01/16/19 208 lb (94.3 kg)    Physical Exam Constitutional:      General: She is not in acute distress.    Appearance: She is well-developed.  HENT:     Head: Normocephalic and atraumatic.     Right Ear: External ear normal.     Left Ear: External ear normal.     Mouth/Throat:     Pharynx: No oropharyngeal exudate.  Eyes:     Conjunctiva/sclera: Conjunctivae normal.     Pupils: Pupils are equal, round, and reactive to light.  Neck:     Thyroid: No thyromegaly.  Cardiovascular:     Rate and Rhythm: Normal rate and regular rhythm.     Heart sounds: Normal heart sounds. No murmur. No friction rub. No gallop.   Pulmonary:     Effort: Pulmonary effort is normal.     Breath sounds: Normal breath sounds.  Abdominal:     General: Bowel sounds are normal. There is no distension.     Palpations: Abdomen is soft. There is no mass.     Tenderness: There is no abdominal tenderness. There is no guarding.     Hernia: No hernia is present.  Musculoskeletal:        General: No tenderness or deformity. Normal range of motion.     Cervical back: Normal range of motion and neck supple.  Lymphadenopathy:     Cervical: No cervical adenopathy.  Skin:    General: Skin is warm and dry.     Findings: No rash.  Neurological:     Mental Status: She is alert and oriented to person, place, and time.     Deep Tendon Reflexes: Reflexes normal.     Reflex Scores:      Tricep reflexes are 2+ on the right side and 2+ on the left side.      Bicep reflexes are 2+ on the right side and 2+ on the left side.      Brachioradialis reflexes are 2+ on the right side and 2+ on the left side.      Patellar reflexes are 2+ on the right side and 2+ on the left side. Psychiatric:        Speech: Speech normal.        Behavior: Behavior normal.        Thought Content: Thought content normal.     Assessment/Plan: Health  Maintenance Due  Topic Date Due  . COLONOSCOPY  02/06/2014   Health Maintenance reviewed - states she will complete cologuard.  1. Preventative health care Discussed importance of exercise regularly.   2. Screening for colon cancer She has been putting this off for years. Encouraged her to mark it on her calendar! - Cologuard  3. Hyperglycemia We discussed following more of a diabetic  diet. We discussed increased A1c from previous blood work. We discussed carbohydrate counting and importance of daily exercise to help keep sugars low. We will plan to recheck A1c in 3 to 6 months. - Hemoglobin A1c; Future  4. Bipolar affective disorder, remission status unspecified ([place]) Mood has been stable overall. She actually has been able to decrease her Lamictal. She expressed some interest in coming off of medications, but I encouraged her to talk with her psychiatrist and not stop these medicines. She has seemed to do well with a decrease in dose however.    Return in about 6 months (around 07/21/2020) for bloodwork in 3 months.  Micheline Rough, MD

## 2020-01-22 NOTE — Telephone Encounter (Signed)
Cologuard order faxed to [facility] at [phone].  

## 2020-01-23 ENCOUNTER — Encounter: Payer: Self-pay | Admitting: Family Medicine

## 2020-02-02 NOTE — Telephone Encounter (Signed)
Can you get the supplements on her list? I couldn't get any to come up in our system and it wouldn't let me enter as other/otc?

## 2020-02-03 DIAGNOSIS — R69 Illness, unspecified: Secondary | ICD-10-CM | POA: Diagnosis not present

## 2020-02-17 DIAGNOSIS — R69 Illness, unspecified: Secondary | ICD-10-CM | POA: Diagnosis not present

## 2020-02-27 DIAGNOSIS — R69 Illness, unspecified: Secondary | ICD-10-CM | POA: Diagnosis not present

## 2020-02-29 ENCOUNTER — Ambulatory Visit: Payer: Medicare HMO | Attending: Internal Medicine

## 2020-02-29 DIAGNOSIS — Z23 Encounter for immunization: Secondary | ICD-10-CM

## 2020-02-29 NOTE — Progress Notes (Signed)
   Covid-19 Vaccination Clinic  Name:  RAVAN SCHLEMMER    MRN: [id number] DOB: December 01, 1965  02/29/2020  Ms. Kolinski was observed post Covid-19 immunization for 15 minutes without incident. She was provided with Vaccine Information Sheet and instruction to access the V-Safe system.   Ms. Renwick was instructed to call 911 with any severe reactions post vaccine: [name] Difficulty breathing  . Swelling of face and throat  . A fast heartbeat  . A bad rash all over body  . Dizziness and weakness   Immunizations Administered    Name Date Dose VIS Date Route   Pfizer COVID-19 Vaccine 02/29/2020 12:08 PM 0.3 mL 11/08/2019 Intramuscular   Manufacturer: [employer], [employer]   Lot: [id number]   NDC: [address]-6

## 2020-03-02 DIAGNOSIS — R69 Illness, unspecified: Secondary | ICD-10-CM | POA: Diagnosis not present

## 2020-03-12 DIAGNOSIS — R69 Illness, unspecified: Secondary | ICD-10-CM | POA: Diagnosis not present

## 2020-03-18 DIAGNOSIS — R69 Illness, unspecified: Secondary | ICD-10-CM | POA: Diagnosis not present

## 2020-03-24 ENCOUNTER — Ambulatory Visit: Payer: Medicare HMO | Attending: Internal Medicine

## 2020-03-24 DIAGNOSIS — Z23 Encounter for immunization: Secondary | ICD-10-CM

## 2020-03-24 NOTE — Progress Notes (Signed)
   Covid-19 Vaccination Clinic  Name:  ELON LOMELI    MRN: [id number] DOB: 1966/02/11  03/24/2020  Ms. Lemus was observed post Covid-19 immunization for 15 minutes without incident. She was provided with Vaccine Information Sheet and instruction to access the V-Safe system.   Ms. Trevathan was instructed to call 911 with any severe reactions post vaccine: [name] Difficulty breathing  . Swelling of face and throat  . A fast heartbeat  . A bad rash all over body  . Dizziness and weakness   Immunizations Administered    Name Date Dose VIS Date Route   Pfizer COVID-19 Vaccine 03/24/2020 11:40 AM 0.3 mL 01/22/2019 Intramuscular   Manufacturer: [employer], [employer]   Lot: [id number]   NDC: [address]-4

## 2020-03-26 DIAGNOSIS — R69 Illness, unspecified: Secondary | ICD-10-CM | POA: Diagnosis not present

## 2020-04-08 DIAGNOSIS — R69 Illness, unspecified: Secondary | ICD-10-CM | POA: Diagnosis not present

## 2020-04-20 DIAGNOSIS — R69 Illness, unspecified: Secondary | ICD-10-CM | POA: Diagnosis not present

## 2020-04-21 ENCOUNTER — Other Ambulatory Visit: Payer: Self-pay

## 2020-04-22 ENCOUNTER — Other Ambulatory Visit (INDEPENDENT_AMBULATORY_CARE_PROVIDER_SITE_OTHER): Payer: Medicare HMO

## 2020-04-22 DIAGNOSIS — R739 Hyperglycemia, unspecified: Secondary | ICD-10-CM

## 2020-04-22 LAB — HEMOGLOBIN A1C: Hgb A1c MFr Bld: 5.8 % (ref 4.6–6.5)

## 2020-05-11 DIAGNOSIS — R69 Illness, unspecified: Secondary | ICD-10-CM | POA: Diagnosis not present

## 2020-05-25 DIAGNOSIS — R69 Illness, unspecified: Secondary | ICD-10-CM | POA: Diagnosis not present

## 2020-06-09 DIAGNOSIS — R69 Illness, unspecified: Secondary | ICD-10-CM | POA: Diagnosis not present

## 2020-06-11 DIAGNOSIS — F4310 Post-traumatic stress disorder, unspecified: Secondary | ICD-10-CM | POA: Diagnosis not present

## 2020-06-11 DIAGNOSIS — R69 Illness, unspecified: Secondary | ICD-10-CM | POA: Diagnosis not present

## 2020-06-15 DIAGNOSIS — R69 Illness, unspecified: Secondary | ICD-10-CM | POA: Diagnosis not present

## 2020-07-07 DIAGNOSIS — R69 Illness, unspecified: Secondary | ICD-10-CM | POA: Diagnosis not present

## 2020-07-15 DIAGNOSIS — F4310 Post-traumatic stress disorder, unspecified: Secondary | ICD-10-CM | POA: Diagnosis not present

## 2020-07-15 DIAGNOSIS — R69 Illness, unspecified: Secondary | ICD-10-CM | POA: Diagnosis not present

## 2020-07-21 DIAGNOSIS — R69 Illness, unspecified: Secondary | ICD-10-CM | POA: Diagnosis not present

## 2020-07-21 DIAGNOSIS — G8929 Other chronic pain: Secondary | ICD-10-CM | POA: Diagnosis not present

## 2020-07-21 DIAGNOSIS — M9901 Segmental and somatic dysfunction of cervical region: Secondary | ICD-10-CM | POA: Diagnosis not present

## 2020-07-21 DIAGNOSIS — R509 Fever, unspecified: Secondary | ICD-10-CM | POA: Diagnosis not present

## 2020-07-21 DIAGNOSIS — E872 Acidosis: Secondary | ICD-10-CM | POA: Diagnosis not present

## 2020-07-22 ENCOUNTER — Ambulatory Visit: Payer: Medicare HMO | Admitting: Family Medicine

## 2020-07-31 ENCOUNTER — Ambulatory Visit: Payer: Medicare HMO | Admitting: Family Medicine

## 2020-08-04 DIAGNOSIS — G8929 Other chronic pain: Secondary | ICD-10-CM | POA: Diagnosis not present

## 2020-08-04 DIAGNOSIS — R509 Fever, unspecified: Secondary | ICD-10-CM | POA: Diagnosis not present

## 2020-08-04 DIAGNOSIS — R69 Illness, unspecified: Secondary | ICD-10-CM | POA: Diagnosis not present

## 2020-08-04 DIAGNOSIS — E872 Acidosis: Secondary | ICD-10-CM | POA: Diagnosis not present

## 2020-08-04 DIAGNOSIS — M9901 Segmental and somatic dysfunction of cervical region: Secondary | ICD-10-CM | POA: Diagnosis not present

## 2020-08-07 DIAGNOSIS — Z1231 Encounter for screening mammogram for malignant neoplasm of breast: Secondary | ICD-10-CM | POA: Diagnosis not present

## 2020-08-10 ENCOUNTER — Ambulatory Visit: Payer: Medicare HMO | Admitting: Family Medicine

## 2020-08-11 DIAGNOSIS — R509 Fever, unspecified: Secondary | ICD-10-CM | POA: Diagnosis not present

## 2020-08-11 DIAGNOSIS — E872 Acidosis: Secondary | ICD-10-CM | POA: Diagnosis not present

## 2020-08-11 DIAGNOSIS — M9901 Segmental and somatic dysfunction of cervical region: Secondary | ICD-10-CM | POA: Diagnosis not present

## 2020-08-11 DIAGNOSIS — R69 Illness, unspecified: Secondary | ICD-10-CM | POA: Diagnosis not present

## 2020-08-11 DIAGNOSIS — G8929 Other chronic pain: Secondary | ICD-10-CM | POA: Diagnosis not present

## 2020-08-12 ENCOUNTER — Other Ambulatory Visit: Payer: Self-pay | Admitting: Obstetrics and Gynecology

## 2020-08-12 DIAGNOSIS — R928 Other abnormal and inconclusive findings on diagnostic imaging of breast: Secondary | ICD-10-CM

## 2020-08-14 ENCOUNTER — Ambulatory Visit
Admission: RE | Admit: 2020-08-14 | Discharge: 2020-08-14 | Disposition: A | Discharge status: Home / Self Care | Payer: Medicare HMO | Source: Ambulatory Visit | Attending: Obstetrics and Gynecology | Admitting: Obstetrics and Gynecology

## 2020-08-14 ENCOUNTER — Other Ambulatory Visit: Payer: Self-pay

## 2020-08-14 ENCOUNTER — Ambulatory Visit: Payer: Medicare HMO

## 2020-08-14 DIAGNOSIS — R922 Inconclusive mammogram: Secondary | ICD-10-CM | POA: Diagnosis not present

## 2020-08-14 DIAGNOSIS — R928 Other abnormal and inconclusive findings on diagnostic imaging of breast: Secondary | ICD-10-CM

## 2020-08-25 ENCOUNTER — Telehealth: Payer: Self-pay | Admitting: Family Medicine

## 2020-08-25 NOTE — Telephone Encounter (Signed)
Left a detailed message at the pts cell number stating I did not call her and asked that she return a call with any question or help needed.

## 2020-08-25 NOTE — Telephone Encounter (Signed)
Patient is returning two calls to the office- no vm left with the patient.  Patient states she will be out after 2:00, ok to leave a message on her answering machine.

## 2020-08-26 NOTE — Telephone Encounter (Signed)
disregard

## 2020-08-28 ENCOUNTER — Encounter: Payer: Self-pay | Admitting: Family Medicine

## 2020-08-28 ENCOUNTER — Ambulatory Visit (INDEPENDENT_AMBULATORY_CARE_PROVIDER_SITE_OTHER): Payer: Medicare HMO | Admitting: Family Medicine

## 2020-08-28 ENCOUNTER — Other Ambulatory Visit: Payer: Self-pay

## 2020-08-28 VITALS — BP 98/60 | HR 69 | Temp 97.9°F | Ht 67.5 in | Wt 207.8 lb

## 2020-08-28 DIAGNOSIS — E785 Hyperlipidemia, unspecified: Secondary | ICD-10-CM | POA: Diagnosis not present

## 2020-08-28 DIAGNOSIS — Z79899 Other long term (current) drug therapy: Secondary | ICD-10-CM

## 2020-08-28 DIAGNOSIS — R739 Hyperglycemia, unspecified: Secondary | ICD-10-CM | POA: Diagnosis not present

## 2020-08-28 NOTE — Progress Notes (Signed)
[name] DOB: 11/12/1966 Encounter date: 08/28/2020  This is a 54 y.o. female who presents with Chief Complaint  Patient presents with  . Follow-up  . right arch pain x2 week, no known injury    History of present illness: She is planning on doing her cologuard this weekend. Actually got a FIT testing kit from insurance to complete.   Here for follow up on sugar. We had discussed cutting back on carbs at last visit.   Getting pain, burning in arch of foot which goes around to ankle. Had inserts made a couple of years back and doesn't wear them. Tried them and feels it is helping with foot pain. Hurts most when she wakes up and starts moving; sometimes though during day will get pain around ankle; less around arch. Bothering her for a couple of weeks now. No known injury. She is exercising daily and working with trainer. 30min 4 times/week.  Bipolar: has been stable; good about taking medication. Exercising is helping. Sleeping well.   She had also cut back on drinking alcohol. She also wasn't taking supplements when last A1C was done.    No Known Allergies Current Meds  Medication Sig  . clonazePAM (KLONOPIN) 0.5 MG tablet TAKE ONE TABLET (0.5 MG DOSE) BY MOUTH DAILY AS NEEDED FOR ANXIETY. 90 DAY SUPPLY  . lamoTRIgine (LAMICTAL PO) Take 75 mg by mouth daily.  . traZODone (DESYREL) 25 mg TABS tablet Take 12.5 mg by mouth at bedtime.     Review of Systems  Constitutional: Negative for chills, fatigue and fever.  Respiratory: Negative for cough, chest tightness, shortness of breath and wheezing.   Cardiovascular: Negative for chest pain, palpitations and leg swelling.    Objective:  BP 98/60 (BP Location: Left Arm, Patient Position: Sitting, Cuff Size: Large)   Pulse 69   Temp 97.9 F (36.6 C) (Oral)   Ht 5' 7.5" (1.715 m)   Wt 207 lb 12.8 oz (94.3 kg)   LMP 09/16/2016 (Exact Date)   BMI 32.07 kg/m   Weight: 207 lb 12.8 oz (94.3 kg)   BP Readings from Last 3  Encounters:  08/28/20 98/60  01/22/20 116/82  10/28/19 102/60   Wt Readings from Last 3 Encounters:  08/28/20 207 lb 12.8 oz (94.3 kg)  01/22/20 211 lb 6.4 oz (95.9 kg)  10/28/19 203 lb 1.6 oz (92.1 kg)    Physical Exam Constitutional:      General: She is not in acute distress.    Appearance: She is well-developed.  Cardiovascular:     Rate and Rhythm: Normal rate and regular rhythm.     Heart sounds: Normal heart sounds. No murmur heard.  No friction rub.  Pulmonary:     Effort: Pulmonary effort is normal. No respiratory distress.     Breath sounds: Normal breath sounds. No wheezing or rales.  Musculoskeletal:     Right lower leg: No edema.     Left lower leg: No edema.  Feet:     Comments: There is some tenderness with palpation to the arch of the right foot.  No palpable abnormalities.  Discomfort is minimal. Neurological:     Mental Status: She is alert and oriented to person, place, and time.  Psychiatric:        Behavior: Behavior normal.     Assessment/Plan  1. High risk medication use - CBC with Differential/Platelet; Future  2. Hyperlipidemia, unspecified hyperlipidemia type - Lipid panel; Future  3. Hyperglycemia - Comprehensive metabolic panel; Future -   Hemoglobin A1c; Future  Return for pending bloodwork.       , MD 

## 2020-08-29 LAB — CBC WITH DIFFERENTIAL/PLATELET
Absolute Monocytes: 699 cells/uL (ref 200–950)
Basophils Absolute: 31 cells/uL (ref 0–200)
Basophils Relative: 0.6 %
Eosinophils Absolute: 122 cells/uL (ref 15–500)
Eosinophils Relative: 2.4 %
HCT: 40.8 % (ref 35.0–45.0)
Hemoglobin: 13.6 g/dL (ref 11.7–15.5)
Lymphs Abs: 1454 cells/uL (ref 850–3900)
MCH: 30.8 pg (ref 27.0–33.0)
MCHC: 33.3 g/dL (ref 32.0–36.0)
MCV: 92.5 fL (ref 80.0–100.0)
MPV: 10.6 fL (ref 7.5–12.5)
Monocytes Relative: 13.7 %
Neutro Abs: 2795 cells/uL (ref 1500–7800)
Neutrophils Relative %: 54.8 %
Platelets: 268 10*3/uL (ref 140–400)
RBC: 4.41 10*6/uL (ref 3.80–5.10)
RDW: 13 % (ref 11.0–15.0)
Total Lymphocyte: 28.5 %
WBC: 5.1 10*3/uL (ref 3.8–10.8)

## 2020-08-29 LAB — LIPID PANEL
Cholesterol: 255 mg/dL — ABNORMAL HIGH (ref <200)
HDL: 71 mg/dL (ref >=50)
LDL Cholesterol (Calc): 159 mg/dL (calc) — ABNORMAL HIGH
Non-HDL Cholesterol (Calc): 184 mg/dL (calc) — ABNORMAL HIGH (ref <130)
Total CHOL/HDL Ratio: 3.6 (calc) (ref <5.0)
Triglycerides: 125 mg/dL (ref <150)

## 2020-08-29 LAB — COMPREHENSIVE METABOLIC PANEL
AG Ratio: 1.8 (calc) (ref 1.0–2.5)
ALT: 15 U/L (ref 6–29)
AST: 17 U/L (ref 10–35)
Albumin: 4.3 g/dL (ref 3.6–5.1)
Alkaline phosphatase (APISO): 61 U/L (ref 37–153)
BUN: 12 mg/dL (ref 7–25)
CO2: 28 mmol/L (ref 20–32)
Calcium: 9.6 mg/dL (ref 8.6–10.4)
Chloride: 104 mmol/L (ref 98–110)
Creat: 0.84 mg/dL (ref 0.50–1.05)
Globulin: 2.4 g/dL (calc) (ref 1.9–3.7)
Glucose, Bld: 92 mg/dL (ref 65–99)
Potassium: 4.5 mmol/L (ref 3.5–5.3)
Sodium: 138 mmol/L (ref 135–146)
Total Bilirubin: 0.5 mg/dL (ref 0.2–1.2)
Total Protein: 6.7 g/dL (ref 6.1–8.1)

## 2020-08-29 LAB — HEMOGLOBIN A1C
Hgb A1c MFr Bld: 5.7 % of total Hgb — ABNORMAL HIGH (ref <5.7)
Mean Plasma Glucose: 117 (calc)
eAG (mmol/L): 6.5 (calc)

## 2020-09-07 DIAGNOSIS — R69 Illness, unspecified: Secondary | ICD-10-CM | POA: Diagnosis not present

## 2020-09-10 DIAGNOSIS — R69 Illness, unspecified: Secondary | ICD-10-CM | POA: Diagnosis not present

## 2020-09-10 DIAGNOSIS — F4310 Post-traumatic stress disorder, unspecified: Secondary | ICD-10-CM | POA: Diagnosis not present

## 2020-09-13 DIAGNOSIS — Z1211 Encounter for screening for malignant neoplasm of colon: Secondary | ICD-10-CM | POA: Diagnosis not present

## 2020-09-13 LAB — COLOGUARD: Cologuard: NEGATIVE

## 2020-09-23 DIAGNOSIS — R69 Illness, unspecified: Secondary | ICD-10-CM | POA: Diagnosis not present

## 2020-09-24 LAB — COLOGUARD: COLOGUARD: NEGATIVE

## 2020-09-24 LAB — EXTERNAL GENERIC LAB PROCEDURE: COLOGUARD: NEGATIVE

## 2020-09-25 ENCOUNTER — Encounter: Payer: Self-pay | Admitting: Family Medicine

## 2020-10-05 DIAGNOSIS — F0280 Dementia in other diseases classified elsewhere without behavioral disturbance: Secondary | ICD-10-CM | POA: Diagnosis not present

## 2020-10-05 DIAGNOSIS — R1310 Dysphagia, unspecified: Secondary | ICD-10-CM | POA: Diagnosis not present

## 2020-10-05 DIAGNOSIS — R69 Illness, unspecified: Secondary | ICD-10-CM | POA: Diagnosis not present

## 2020-10-08 DIAGNOSIS — R1310 Dysphagia, unspecified: Secondary | ICD-10-CM | POA: Diagnosis not present

## 2020-10-08 DIAGNOSIS — R69 Illness, unspecified: Secondary | ICD-10-CM | POA: Diagnosis not present

## 2020-10-08 DIAGNOSIS — F0280 Dementia in other diseases classified elsewhere without behavioral disturbance: Secondary | ICD-10-CM | POA: Diagnosis not present

## 2020-10-15 DIAGNOSIS — R69 Illness, unspecified: Secondary | ICD-10-CM | POA: Diagnosis not present

## 2020-10-15 DIAGNOSIS — F0280 Dementia in other diseases classified elsewhere without behavioral disturbance: Secondary | ICD-10-CM | POA: Diagnosis not present

## 2020-10-15 DIAGNOSIS — R1310 Dysphagia, unspecified: Secondary | ICD-10-CM | POA: Diagnosis not present

## 2020-10-16 ENCOUNTER — Ambulatory Visit (INDEPENDENT_AMBULATORY_CARE_PROVIDER_SITE_OTHER): Payer: Medicare HMO | Admitting: Family Medicine

## 2020-10-16 ENCOUNTER — Other Ambulatory Visit: Payer: Self-pay

## 2020-10-16 ENCOUNTER — Encounter: Payer: Self-pay | Admitting: Family Medicine

## 2020-10-16 ENCOUNTER — Ambulatory Visit (INDEPENDENT_AMBULATORY_CARE_PROVIDER_SITE_OTHER): Payer: Medicare HMO

## 2020-10-16 VITALS — BP 100/80 | HR 71 | Temp 98.2°F | Ht 67.5 in | Wt 198.8 lb

## 2020-10-16 DIAGNOSIS — M79671 Pain in right foot: Secondary | ICD-10-CM

## 2020-10-16 DIAGNOSIS — M7731 Calcaneal spur, right foot: Secondary | ICD-10-CM | POA: Diagnosis not present

## 2020-10-16 NOTE — Progress Notes (Signed)
  [name] DOB: 1966/06/09 Encounter date: 10/16/2020  This is a 54 y.o. female who presents with Chief Complaint  Patient presents with  . Ankle Pain    patient complains of right foot and ankle pain for months, no known injury    History of present illness: Thinks its the arch of foot - hurts most in morning - pulling under ankle medially. Having some swelling; worsens through day. Hurts more when on it more or if exerting self - like going up hill. Some swelling through day. Not sure if prior injury. Has been bothering her since summer. No change in shoes. Tried slip on brace; tried insoles which actually seems to make it more sore.    No Known Allergies Current Meds  Medication Sig  . clonazePAM (KLONOPIN) 0.5 MG tablet TAKE ONE TABLET (0.5 MG DOSE) BY MOUTH DAILY AS NEEDED FOR ANXIETY. 90 DAY SUPPLY  . lamoTRIgine (LAMICTAL PO) Take 75 mg by mouth daily.  . traZODone (DESYREL) 25 mg TABS tablet Take 12.5 mg by mouth at bedtime.     Review of Systems  Constitutional: Negative for chills, fatigue and fever.  Respiratory: Negative for cough, chest tightness, shortness of breath and wheezing.   Cardiovascular: Negative for chest pain, palpitations and leg swelling.  Musculoskeletal: Positive for joint swelling.       Foot pain; see HPI    Objective:  BP 100/80 (BP Location: Left Arm, Patient Position: Sitting, Cuff Size: Large)   Pulse 71   Temp 98.2 F (36.8 C) (Oral)   Ht 5' 7.5" (1.715 m)   Wt 198 lb 12.8 oz (90.2 kg)   LMP 09/16/2016 (Exact Date)   BMI 30.68 kg/m   Weight: 198 lb 12.8 oz (90.2 kg)   BP Readings from Last 3 Encounters:  10/16/20 100/80  08/28/20 98/60  01/22/20 116/82   Wt Readings from Last 3 Encounters:  10/16/20 198 lb 12.8 oz (90.2 kg)  08/28/20 207 lb 12.8 oz (94.3 kg)  01/22/20 211 lb 6.4 oz (95.9 kg)    Physical Exam Constitutional:      Appearance: Normal appearance.  Cardiovascular:     Pulses:          Dorsalis pedis  pulses are 2+ on the right side and 2+ on the left side.       Posterior tibial pulses are 2+ on the right side and 2+ on the left side.     Comments: Swelling medial foot Musculoskeletal:     Right lower leg: No edema (see below).     Left lower leg: No edema.       Feet:  Feet:     Comments: There is tenderness right medial aspect of the ankle and some edema noted, nonspecific, in this area.  There is tenderness of the first metatarsal, but increased tenderness tarsal (cuneiform) and navicular bones.  There is mild plantar fascia tenderness Skin:    General: Skin is warm.  Neurological:     Mental Status: She is alert.     Assessment/Plan  1. Foot pain, right Start with xray; further treatment pending these results.  - DG Foot Complete Right; Future   Return for pending xray.    Theodis Shove, MD

## 2020-10-27 DIAGNOSIS — R1310 Dysphagia, unspecified: Secondary | ICD-10-CM | POA: Diagnosis not present

## 2020-10-27 DIAGNOSIS — R69 Illness, unspecified: Secondary | ICD-10-CM | POA: Diagnosis not present

## 2020-10-27 DIAGNOSIS — F0280 Dementia in other diseases classified elsewhere without behavioral disturbance: Secondary | ICD-10-CM | POA: Diagnosis not present

## 2020-10-31 IMAGING — MG MM DIGITAL DIAGNOSTIC UNILAT*R* W/ TOMO W/ CAD
4 series · 4 of 12 positions shown · non-contrast
Comparison: Previous exam(s).

CLINICAL DATA: 54-year-old female for further evaluation of RIGHT
breast asymmetry on screening mammogram.

EXAM:
DIGITAL DIAGNOSTIC UNILATERAL RIGHT MAMMOGRAM WITH TOMO

[R CC synth-2D]
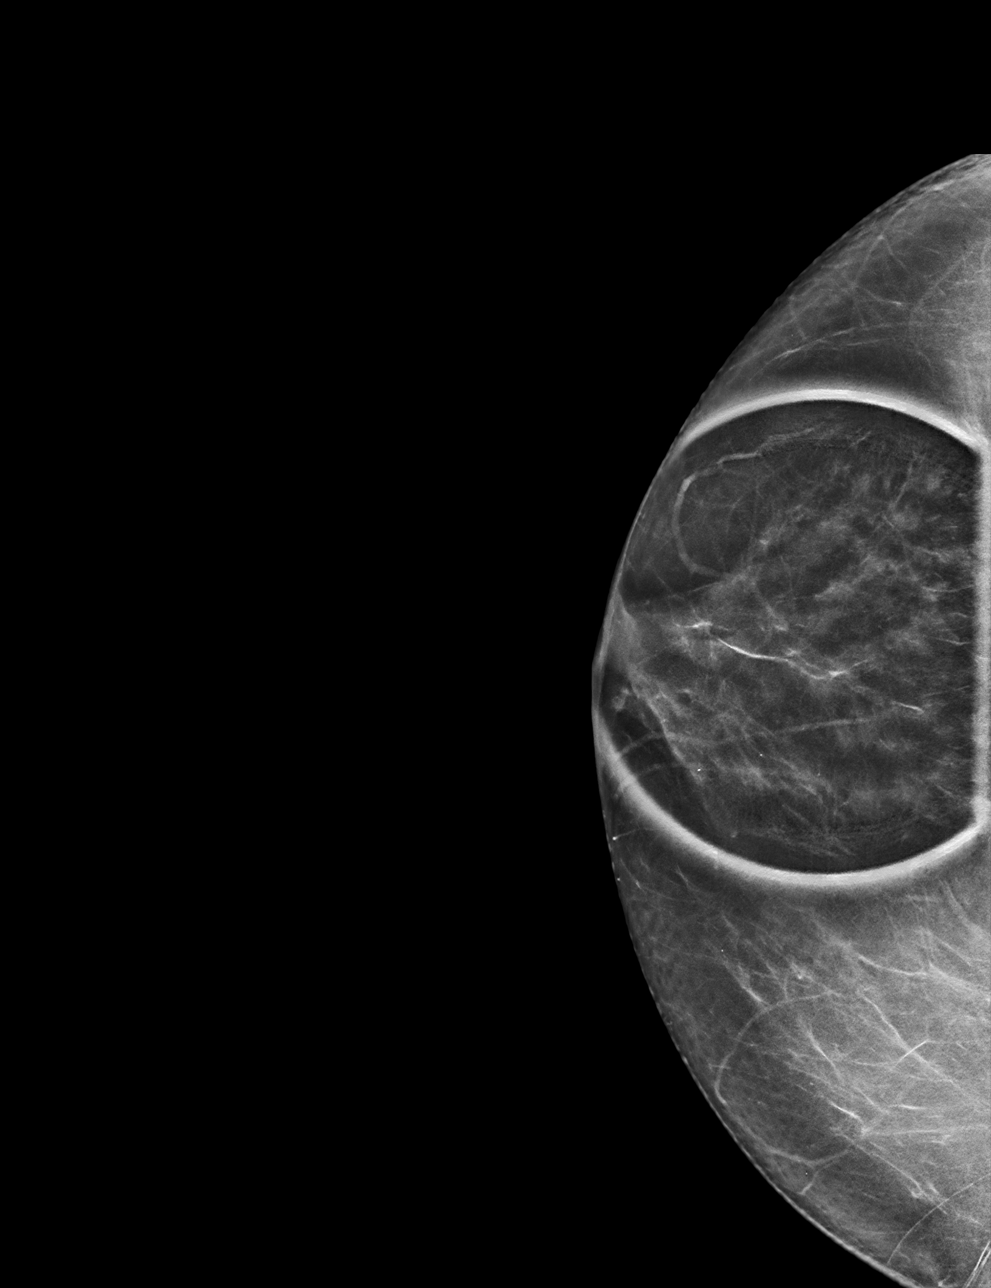

[R MLO synth-2D]
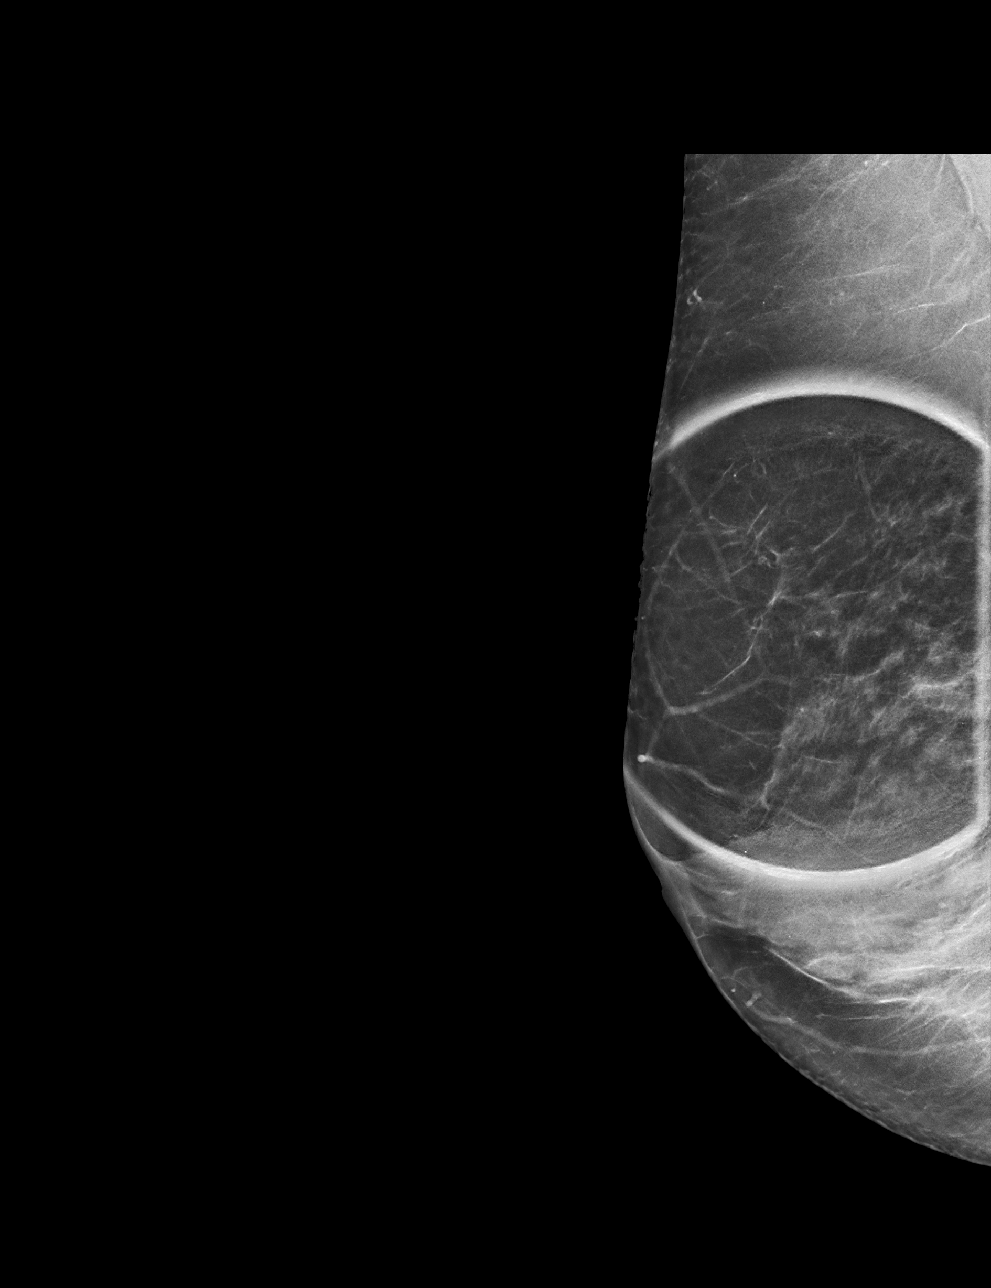

[R CC tomo · tomo slice 29/57.0]
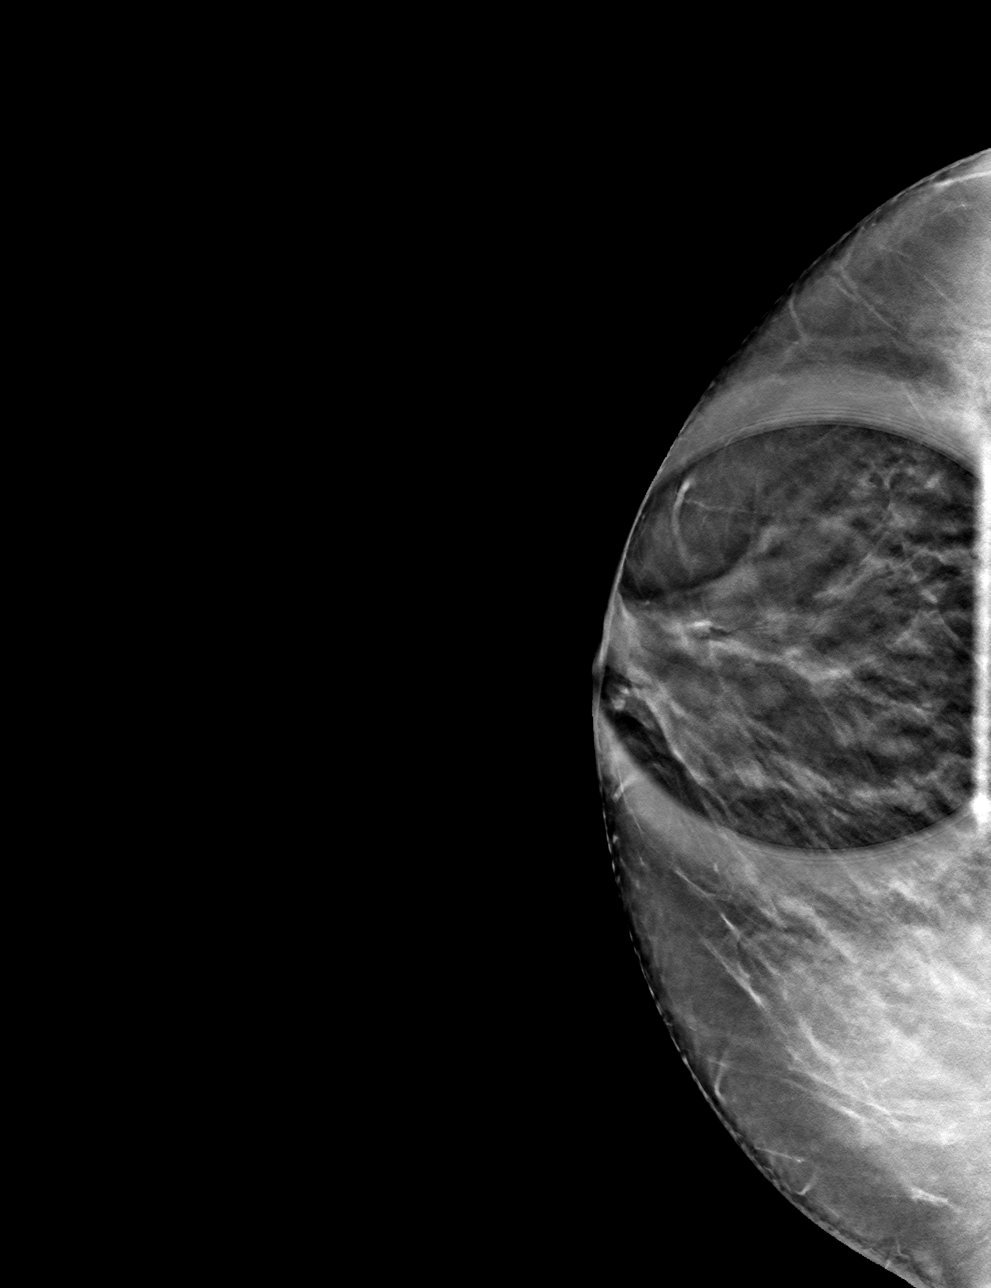

[R MLO tomo · tomo slice 31/61.0]
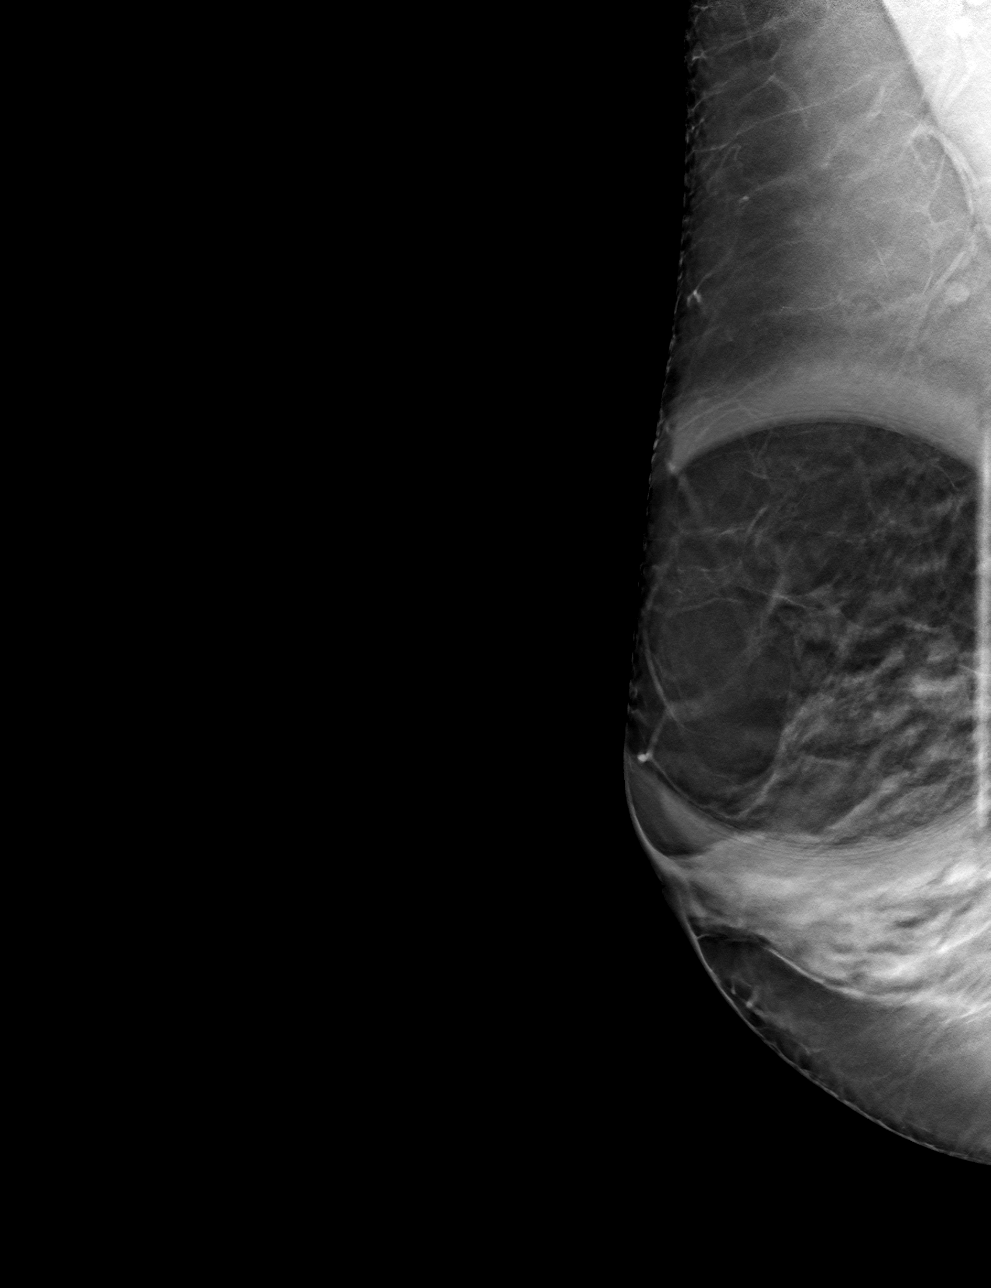

[4 of 12 positions shown; findings below may reference images not displayed]

ACR Breast Density Category c: The breast tissue is heterogeneously
dense, which may obscure small masses.
FINDINGS: 2D/3D spot compression views of the RIGHT breast demonstrate
resolution of the RIGHT breast asymmetry with compression.
IMPRESSION: No persistent abnormality at the site of the screening study
finding.

RECOMMENDATION:
Bilateral screening mammogram in 1 year.

I have discussed the findings and recommendations with the patient.
If applicable, a reminder letter will be sent to the patient
regarding the next appointment.

BI-RADS CATEGORY  1: Negative.

## 2020-11-05 DIAGNOSIS — F0280 Dementia in other diseases classified elsewhere without behavioral disturbance: Secondary | ICD-10-CM | POA: Diagnosis not present

## 2020-11-05 DIAGNOSIS — R1310 Dysphagia, unspecified: Secondary | ICD-10-CM | POA: Diagnosis not present

## 2020-11-05 DIAGNOSIS — R69 Illness, unspecified: Secondary | ICD-10-CM | POA: Diagnosis not present

## 2020-12-01 DIAGNOSIS — F319 Bipolar disorder, unspecified: Secondary | ICD-10-CM | POA: Diagnosis not present

## 2020-12-01 DIAGNOSIS — R69 Illness, unspecified: Secondary | ICD-10-CM | POA: Diagnosis not present

## 2020-12-10 DIAGNOSIS — R69 Illness, unspecified: Secondary | ICD-10-CM | POA: Diagnosis not present

## 2020-12-10 DIAGNOSIS — F3132 Bipolar disorder, current episode depressed, moderate: Secondary | ICD-10-CM | POA: Diagnosis not present

## 2020-12-10 DIAGNOSIS — F319 Bipolar disorder, unspecified: Secondary | ICD-10-CM | POA: Diagnosis not present

## 2020-12-31 DIAGNOSIS — F319 Bipolar disorder, unspecified: Secondary | ICD-10-CM | POA: Diagnosis not present

## 2020-12-31 DIAGNOSIS — R69 Illness, unspecified: Secondary | ICD-10-CM | POA: Diagnosis not present

## 2021-01-11 DIAGNOSIS — R69 Illness, unspecified: Secondary | ICD-10-CM | POA: Diagnosis not present

## 2021-01-13 DIAGNOSIS — R69 Illness, unspecified: Secondary | ICD-10-CM | POA: Diagnosis not present

## 2021-01-19 DIAGNOSIS — R69 Illness, unspecified: Secondary | ICD-10-CM | POA: Diagnosis not present

## 2021-01-27 DIAGNOSIS — R69 Illness, unspecified: Secondary | ICD-10-CM | POA: Diagnosis not present

## 2021-02-03 DIAGNOSIS — R69 Illness, unspecified: Secondary | ICD-10-CM | POA: Diagnosis not present

## 2021-02-03 DIAGNOSIS — F319 Bipolar disorder, unspecified: Secondary | ICD-10-CM | POA: Diagnosis not present

## 2021-02-08 DIAGNOSIS — R69 Illness, unspecified: Secondary | ICD-10-CM | POA: Diagnosis not present

## 2021-02-08 DIAGNOSIS — F3132 Bipolar disorder, current episode depressed, moderate: Secondary | ICD-10-CM | POA: Diagnosis not present

## 2021-02-10 DIAGNOSIS — F319 Bipolar disorder, unspecified: Secondary | ICD-10-CM | POA: Diagnosis not present

## 2021-02-10 DIAGNOSIS — R69 Illness, unspecified: Secondary | ICD-10-CM | POA: Diagnosis not present

## 2021-02-15 ENCOUNTER — Other Ambulatory Visit: Payer: Self-pay

## 2021-02-15 ENCOUNTER — Ambulatory Visit (INDEPENDENT_AMBULATORY_CARE_PROVIDER_SITE_OTHER): Payer: Medicare HMO | Admitting: Family Medicine

## 2021-02-15 ENCOUNTER — Ambulatory Visit (INDEPENDENT_AMBULATORY_CARE_PROVIDER_SITE_OTHER): Payer: Medicare HMO

## 2021-02-15 ENCOUNTER — Encounter: Payer: Self-pay | Admitting: Family Medicine

## 2021-02-15 VITALS — BP 118/76 | HR 73 | Temp 98.2°F | Wt 205.8 lb

## 2021-02-15 DIAGNOSIS — S63501A Unspecified sprain of right wrist, initial encounter: Secondary | ICD-10-CM

## 2021-02-15 NOTE — Progress Notes (Signed)
   Subjective:    Patient ID: [name], female    DOB: 06/27/1966, 55 y.o.   MRN: [id number]  HPI Here for pain in the right wrist that started after she worked out by punching a heavy body bag. She goes to a boxing gym for exercise. She first injured the wrist 3 weeks ago and then stopped working out for 2 weeks. Then last weekend she went back and punched the bag again, making the pain and swelling a little worse. She applied ice at first and now she is using heat. She has been wearing an elastic support sleeve, and this has been helpful. Taking Ibuprofen at times.    Review of Systems  Constitutional: Negative.   Respiratory: Negative.   Cardiovascular: Negative.   Musculoskeletal: Positive for arthralgias.       Objective:   Physical Exam Constitutional:      General: She is not in acute distress.    Appearance: Normal appearance.  Cardiovascular:     Rate and Rhythm: Normal rate and regular rhythm.     Pulses: Normal pulses.     Heart sounds: Normal heart sounds.  Pulmonary:     Effort: Pulmonary effort is normal.     Breath sounds: Normal breath sounds.  Musculoskeletal:     Comments: The right wrist is mildly swollen and mildly tender all around. ROM is full, but extremes of flexion or extension are oainful. No crepitus. No warmth or erythema   Neurological:     Mental Status: She is alert.           Assessment & Plan:  Wrist sprain, with repeated injuries. We will get Xrays today but I doubt there are any fractures or dislocations. I strongly advised her to stop going to the boxing gym for at least 3 months to allow this to heal. She can wear the sleeve for support. Recheck prn. Gershon Crane, MD

## 2021-02-16 ENCOUNTER — Telehealth: Payer: Self-pay | Admitting: Family Medicine

## 2021-02-16 NOTE — Telephone Encounter (Signed)
Pt is calling in to check the status of the below msg and is aware that once the provider receives and results the x-ray someone in the office will give her a call back.  Pt verbalized understanding and stated that she will wait for the call.

## 2021-02-16 NOTE — Telephone Encounter (Signed)
Patient was sent by Dr. Clent Ridges yesterday and wants to know her X-Ray results because she is in a lot of pain.  Please Advise

## 2021-02-17 NOTE — Telephone Encounter (Signed)
Spoke with pt reviewed x-ray results with pt verbalized understanding 

## 2021-02-23 DIAGNOSIS — F319 Bipolar disorder, unspecified: Secondary | ICD-10-CM | POA: Diagnosis not present

## 2021-02-23 DIAGNOSIS — R69 Illness, unspecified: Secondary | ICD-10-CM | POA: Diagnosis not present

## 2021-02-24 ENCOUNTER — Ambulatory Visit: Payer: Medicare HMO

## 2021-02-24 DIAGNOSIS — Z79899 Other long term (current) drug therapy: Secondary | ICD-10-CM | POA: Diagnosis not present

## 2021-02-24 DIAGNOSIS — R69 Illness, unspecified: Secondary | ICD-10-CM | POA: Diagnosis not present

## 2021-03-02 DIAGNOSIS — R69 Illness, unspecified: Secondary | ICD-10-CM | POA: Diagnosis not present

## 2021-03-02 DIAGNOSIS — F319 Bipolar disorder, unspecified: Secondary | ICD-10-CM | POA: Diagnosis not present

## 2021-03-03 ENCOUNTER — Other Ambulatory Visit: Payer: Self-pay

## 2021-03-03 ENCOUNTER — Ambulatory Visit (INDEPENDENT_AMBULATORY_CARE_PROVIDER_SITE_OTHER): Payer: Medicare HMO

## 2021-03-03 DIAGNOSIS — Z0000 Encounter for general adult medical examination without abnormal findings: Secondary | ICD-10-CM

## 2021-03-03 NOTE — Progress Notes (Signed)
Virtual Visit via Telephone Note  I connected with  [name] on 03/03/21 at 11:00 AM EDT by telephone and verified that I am speaking with the correct person using two identifiers.  Location: Patient: Home Provider: Office Persons participating in the virtual visit: patient/Nurse Health Advisor   I discussed the limitations, risks, security and privacy concerns of performing an evaluation and management service by telephone and the availability of in person appointments. The patient expressed understanding and agreed to proceed.  Interactive audio and video telecommunications were attempted between this nurse and patient, however failed, due to patient having technical difficulties OR patient did not have access to video capability.  We continued and completed visit with audio only.  Some vital signs may be absent or patient reported.   Marzella Schlein, LPN   Subjective:   [name] is a 55 y.o. female who presents for an Initial Medicare Annual Wellness Visit.  Review of Systems     Cardiac Risk Factors include: obesity (BMI >30kg/m2)     Objective:    Today's Vitals   03/03/21 1050  PainSc: 2    There is no height or weight on file to calculate BMI.  Advanced Directives 03/03/2021  Does Patient Have a Medical Advance Directive? No  Would patient like information on creating a medical advance directive? Yes (MAU/Ambulatory/Procedural Areas - Information given)    Current Medications (verified) Outpatient Encounter Medications as of 03/03/2021  Medication Sig  . buPROPion HCl (WELLBUTRIN PO) Take by mouth daily at 12 noon.  . clonazePAM (KLONOPIN) 0.5 MG tablet TAKE ONE TABLET (0.5 MG DOSE) BY MOUTH DAILY AS NEEDED FOR ANXIETY. 90 DAY SUPPLY  . lamoTRIgine (LAMICTAL PO) Take 75 mg by mouth daily.  . traZODone (DESYREL) 25 mg TABS tablet Take 12.5 mg by mouth at bedtime.    No facility-administered encounter medications on file as of 03/03/2021.    Allergies  (verified) Patient has no known allergies.   History: Past Medical History:  Diagnosis Date  . ALLERGIC RHINITIS 01/10/2008  . ANXIETY 01/10/2008  . ATTENTION DEFICIT HYPERACTIVITY DISORDER, ADULT 10/28/2009  . BIPOLAR DISORDER UNSPECIFIED 01/10/2008  . CHEST PAIN, ATYPICAL 01/10/2008  . DEPRESSION 01/10/2008  . INSOMNIA 09/17/2010  . PANIC DISORDER 01/10/2008  . PHLEBITIS\T\THROMBOPHLEB SUP VESSELS LOWER EXTREM 07/05/2010  . PLANTAR WART, LEFT 12/29/2008  . Sebaceous cyst    scalp, seeing general surgeon   Past Surgical History:  Procedure Laterality Date  . inguinal hernia repair Right    2009  . knee surgery     patella fx, [facility]  . TONSILLECTOMY     Family History  Problem Relation Age of Onset  . Anxiety disorder Mother   . Heart disease Father        pacemaker, CAD in his 42s  . Alzheimer's disease Paternal Grandmother   . Heart attack Paternal Grandfather    Social History   Socioeconomic History  . Marital status: Divorced    Spouse name: Not on file  . Number of children: Not on file  . Years of education: Not on file  . Highest education level: Not on file  Occupational History  . Not on file  Tobacco Use  . Smoking status: Never Smoker  . Smokeless tobacco: Never Used  Substance and Sexual Activity  . Alcohol use: Yes    Alcohol/week: 2.0 standard drinks    Types: 2 Standard drinks or equivalent per week    Comment: daily  . Drug  use: No  . Sexual activity: Yes    Partners: Female  Other Topics Concern  . Not on file  Social History Narrative   Work or School: part time work Electronics engineer, on disability for mental health, hx of hospital visit for dehydration and she was then hospitalized for something to do with with psychiatric illness - she feels by mistake      Home Situation: lives alone with a dog; has 2 grandkids      Spiritual Beliefs: catholic beliefs, non-practicing      Lifestyle: regular exercise; diet is pretty good   Social  Determinants of Corporate investment banker Strain: Low Risk   . Difficulty of Paying Living Expenses: Not hard at all  Food Insecurity: No Food Insecurity  . Worried About Programme researcher, broadcasting/film/video in the Last Year: Never true  . Ran Out of Food in the Last Year: Never true  Transportation Needs: No Transportation Needs  . Lack of Transportation (Medical): No  . Lack of Transportation (Non-Medical): No  Physical Activity: Sufficiently Active  . Days of Exercise per Week: 3 days  . Minutes of Exercise per Session: 60 min  Stress: No Stress Concern Present  . Feeling of Stress : Only a little  Social Connections: Moderately Isolated  . Frequency of Communication with Friends and Family: More than three times a week  . Frequency of Social Gatherings with Friends and Family: More than three times a week  . Attends Religious Services: Never  . Active Member of Clubs or Organizations: Yes  . Attends Banker Meetings: 1 to 4 times per year  . Marital Status: Divorced    Tobacco Counseling Counseling given: Not Answered   Clinical Intake:  Pre-visit preparation completed: Yes  Pain : 0-10 Pain Score: 2  (wrist) Pain Type: Acute pain Pain Location: Wrist Pain Orientation: Right Pain Descriptors / Indicators: Aching Pain Onset: 1 to 4 weeks ago Pain Frequency: Intermittent     BMI - recorded: 31.76 Nutritional Status: BMI > 30  Obese Nutritional Risks: None Diabetes: No  How often do you need to have someone help you when you read instructions, pamphlets, or other written materials from your doctor or pharmacy?: 1 - Never  Diabetic?No  Interpreter Needed?: No  Information entered by :: Lanier Ensign, LPN   Activities of Daily Living In your present state of health, do you have any difficulty performing the following activities: 03/03/2021  Hearing? N  Vision? N  Difficulty concentrating or making decisions? N  Walking or climbing stairs? N  Dressing or  bathing? N  Doing errands, shopping? N  Preparing Food and eating ? N  Using the Toilet? N  In the past six months, have you accidently leaked urine? N  Do you have problems with loss of bowel control? N  Managing your Medications? N  Managing your Finances? N  Housekeeping or managing your Housekeeping? N  Some recent data might be hidden    Patient Care Team: Wynn Banker, MD as PCP - General (Family Medicine) Elaina Pattee, MD as Consulting Physician (Psychiatry)  Indicate any recent Medical Services you may have received from other than [facility] providers in the past year (date may be approximate).     Assessment:   This is a routine wellness examination for [facility].  Hearing/Vision screen  Hearing Screening   125Hz  250Hz  500Hz  1000Hz  2000Hz  3000Hz  4000Hz  6000Hz  8000Hz   Right ear:  Left ear:           Comments: Pt denies any hearing issues   Vision Screening Comments: Pt follows up annually with Dr Emily FilbertGould for eye exams   Dietary issues and exercise activities discussed: Current Exercise Habits: Home exercise routine, Type of exercise: strength training/weights;walking, Time (Minutes): 60, Frequency (Times/Week): 3, Weekly Exercise (Minutes/Week): 180  Goals    . Patient Stated     Pt states lose weight and eating better       Depression Screen PHQ 2/9 Scores 03/03/2021 12/31/2018  PHQ - 2 Score 0 0  PHQ- 9 Score - 3    Fall Risk Fall Risk  03/03/2021  Number falls in past yr: 0  Injury with Fall? 0  Risk for fall due to : Impaired vision    FALL RISK PREVENTION PERTAINING TO THE HOME:  Any stairs in or around the home? Yes  If so, are there any without handrails? No  Home free of loose throw rugs in walkways, pet beds, electrical cords, etc? Yes  Adequate lighting in your home to reduce risk of falls? Yes   ASSISTIVE DEVICES UTILIZED TO PREVENT FALLS:  Life alert? No  Use of a cane, walker or w/c? No  Grab bars in the bathroom? No  Shower chair or  bench in shower? No  Elevated toilet seat or a handicapped toilet? No   TIMED UP AND GO:  Was the test performed? No .     Cognitive Function:     6CIT Screen 03/03/2021  What Year? 0 points  What month? 0 points  Count back from 20 0 points  Months in reverse 0 points  Repeat phrase 2 points    Immunizations Immunization History  Administered Date(s) Administered  . Influenza Whole 08/28/2010  . Influenza,inj,Quad PF,6+ Mos 10/11/2018  . PFIZER(Purple Top)SARS-COV-2 Vaccination 02/29/2020, 03/24/2020  . Td 12/31/2018    TDAP status: Up to date  Flu Vaccine status: Up to date    Covid-19 vaccine status: Completed vaccines  Qualifies for Shingles Vaccine? Yes   Zostavax completed No   Shingrix Completed?: No.    Education has been provided regarding the importance of this vaccine. Patient has been advised to call insurance company to determine out of pocket expense if they have not yet received this vaccine. Advised may also receive vaccine at local pharmacy or Health Dept. Verbalized acceptance and understanding.  Screening Tests Health Maintenance  Topic Date Due  . COVID-19 Vaccine (3 - Booster for Pfizer series) 09/23/2020  . HIV Screening  06/20/2026 (Originally 02/07/1981)  . INFLUENZA VACCINE  06/28/2021  . MAMMOGRAM  06/28/2021  . PAP SMEAR-Modifier  06/28/2022  . TETANUS/TDAP  12/31/2028  . COLONOSCOPY (Pts 45-1572yrs Insurance coverage will need to be confirmed)  09/13/2030  . Hepatitis C Screening  Completed  . HPV VACCINES  Aged Out    Health Maintenance  Health Maintenance Due  Topic Date Due  . COVID-19 Vaccine (3 - Booster for Pfizer series) 09/23/2020    Colorectal cancer screening: Type of screening: Cologuard. Completed 09/13/20. Repeat every 10 years  Mammogram status: Completed 08/14/20. Repeat every year      Additional Screening:  Hepatitis C Screening:  Completed 12/31/18  Vision Screening: Recommended annual ophthalmology exams  for early detection of glaucoma and other disorders of the eye. Is the patient up to date with their annual eye exam?  Yes  Who is the provider or what is the name of the office in which  the patient attends annual eye exams? Dr Emily Filbert  If pt is not established with a provider, would they like to be referred to a provider to establish care? No .   Dental Screening: Recommended annual dental exams for proper oral hygiene  Community Resource Referral / Chronic Care Management: CRR required this visit?  No   CCM required this visit?  No      Plan:     I have personally reviewed and noted the following in the patient's chart:   . Medical and social history . Use of alcohol, tobacco or illicit drugs  . Current medications and supplements . Functional ability and status . Nutritional status . Physical activity . Advanced directives . List of other physicians . Hospitalizations, surgeries, and ER visits in previous 12 months . Vitals . Screenings to include cognitive, depression, and falls . Referrals and appointments  In addition, I have reviewed and discussed with patient certain preventive protocols, quality metrics, and best practice recommendations. A written personalized care plan for preventive services as well as general preventive health recommendations were provided to patient.     Marzella Schlein, LPN   2/0/2542   Nurse Notes: None

## 2021-03-03 NOTE — Patient Instructions (Addendum)
[name] , Thank you for taking time to come for your Medicare Wellness Visit. I appreciate your ongoing commitment to your health goals. Please review the following plan we discussed and let me know if I can assist you in the future.   Screening recommendations/referrals: Colonoscopy: Done 09/13/20 Cologuard Mammogram: Done 08/14/20 Recommended yearly ophthalmology/optometry visit for glaucoma screening and checkup Recommended yearly dental visit for hygiene and checkup  Vaccinations: Influenza vaccine: Up to date Tdap vaccine: Up to date Shingles vaccine: Shingrix Please contact your pharmacy for coverage information.   Covid-19: Completed 4/3 & 03/24/20  Advanced directives: Advance directive discussed with you today. I have provided a copy for you to complete at home and have notarized. Once this is complete please bring a copy in to our office so we can scan it into your chart.  Conditions/risks identified: Lose weight and continue to eat healthy  Next appointment: Follow up in one year for your annual wellness visit.   Preventive Care 40-64 Years, Female Preventive care refers to lifestyle choices and visits with your health care provider that can promote health and wellness. What does preventive care include?  A yearly physical exam. This is also called an annual well check.  Dental exams once or twice a year.  Routine eye exams. Ask your health care provider how often you should have your eyes checked.  Personal lifestyle choices, including:  Daily care of your teeth and gums.  Regular physical activity.  Eating a healthy diet.  Avoiding tobacco and drug use.  Limiting alcohol use.  Practicing safe sex.  Taking low-dose aspirin daily starting at age 50.  Taking vitamin and mineral supplements as recommended by your health care provider. What happens during an annual well check? The services and screenings done by your health care provider during your annual  well check will depend on your age, overall health, lifestyle risk factors, and family history of disease. Counseling  Your health care provider may ask you questions about your:  Alcohol use.  Tobacco use.  Drug use.  Emotional well-being.  Home and relationship well-being.  Sexual activity.  Eating habits.  Work and work environment.  Method of birth control.  Menstrual cycle.  Pregnancy history. Screening  You may have the following tests or measurements:  Height, weight, and BMI.  Blood pressure.  Lipid and cholesterol levels. These may be checked every 5 years, or more frequently if you are over 50 years old.  Skin check.  Lung cancer screening. You may have this screening every year starting at age 55 if you have a 30-pack-year history of smoking and currently smoke or have quit within the past 15 years.  Fecal occult blood test (FOBT) of the stool. You may have this test every year starting at age 50.  Flexible sigmoidoscopy or colonoscopy. You may have a sigmoidoscopy every 5 years or a colonoscopy every 10 years starting at age 50.  Hepatitis C blood test.  Hepatitis B blood test.  Sexually transmitted disease (STD) testing.  Diabetes screening. This is done by checking your blood sugar (glucose) after you have not eaten for a while (fasting). You may have this done every 1-3 years.  Mammogram. This may be done every 1-2 years. Talk to your health care provider about when you should start having regular mammograms. This may depend on whether you have a family history of breast cancer.  BRCA-related cancer screening. This may be done if you have a family history of breast, ovarian,   tubal, or peritoneal cancers.  Pelvic exam and Pap test. This may be done every 3 years starting at age 21. Starting at age 30, this may be done every 5 years if you have a Pap test in combination with an HPV test.  Bone density scan. This is done to screen for osteoporosis.  You may have this scan if you are at high risk for osteoporosis. Discuss your test results, treatment options, and if necessary, the need for more tests with your health care provider. Vaccines  Your health care provider may recommend certain vaccines, such as:  Influenza vaccine. This is recommended every year.  Tetanus, diphtheria, and acellular pertussis (Tdap, Td) vaccine. You may need a Td booster every 10 years.  Zoster vaccine. You may need this after age 60.  Pneumococcal 13-valent conjugate (PCV13) vaccine. You may need this if you have certain conditions and were not previously vaccinated.  Pneumococcal polysaccharide (PPSV23) vaccine. You may need one or two doses if you smoke cigarettes or if you have certain conditions. Talk to your health care provider about which screenings and vaccines you need and how often you need them. This information is not intended to replace advice given to you by your health care provider. Make sure you discuss any questions you have with your health care provider. Document Released: 12/11/2015 Document Revised: 08/03/2016 Document Reviewed: 09/15/2015 Elsevier Interactive Patient Education  2017 [employer]    Fall Prevention in the Home Falls can cause injuries. They can happen to people of all ages. There are many things you can do to make your home safe and to help prevent falls. What can I do on the outside of my home?  Regularly fix the edges of walkways and driveways and fix any cracks.  Remove anything that might make you trip as you walk through a door, such as a raised step or threshold.  Trim any bushes or trees on the path to your home.  Use bright outdoor lighting.  Clear any walking paths of anything that might make someone trip, such as rocks or tools.  Regularly check to see if handrails are loose or broken. Make sure that both sides of any steps have handrails.  Any raised decks and porches should have guardrails on  the edges.  Have any leaves, snow, or ice cleared regularly.  Use sand or salt on walking paths during winter.  Clean up any spills in your garage right away. This includes oil or grease spills. What can I do in the bathroom?  Use night lights.  Install grab bars by the toilet and in the tub and shower. Do not use towel bars as grab bars.  Use non-skid mats or decals in the tub or shower.  If you need to sit down in the shower, use a plastic, non-slip stool.  Keep the floor dry. Clean up any water that spills on the floor as soon as it happens.  Remove soap buildup in the tub or shower regularly.  Attach bath mats securely with double-sided non-slip rug tape.  Do not have throw rugs and other things on the floor that can make you trip. What can I do in the bedroom?  Use night lights.  Make sure that you have a light by your bed that is easy to reach.  Do not use any sheets or blankets that are too big for your bed. They should not hang down onto the floor.  Have a firm chair that has side arms.   You can use this for support while you get dressed.  Do not have throw rugs and other things on the floor that can make you trip. What can I do in the kitchen?  Clean up any spills right away.  Avoid walking on wet floors.  Keep items that you use a lot in easy-to-reach places.  If you need to reach something above you, use a strong step stool that has a grab bar.  Keep electrical cords out of the way.  Do not use floor polish or wax that makes floors slippery. If you must use wax, use non-skid floor wax.  Do not have throw rugs and other things on the floor that can make you trip. What can I do with my stairs?  Do not leave any items on the stairs.  Make sure that there are handrails on both sides of the stairs and use them. Fix handrails that are broken or loose. Make sure that handrails are as long as the stairways.  Check any carpeting to make sure that it is firmly  attached to the stairs. Fix any carpet that is loose or worn.  Avoid having throw rugs at the top or bottom of the stairs. If you do have throw rugs, attach them to the floor with carpet tape.  Make sure that you have a light switch at the top of the stairs and the bottom of the stairs. If you do not have them, ask someone to add them for you. What else can I do to help prevent falls?  Wear shoes that:  Do not have high heels.  Have rubber bottoms.  Are comfortable and fit you well.  Are closed at the toe. Do not wear sandals.  If you use a stepladder:  Make sure that it is fully opened. Do not climb a closed stepladder.  Make sure that both sides of the stepladder are locked into place.  Ask someone to hold it for you, if possible.  Clearly mark and make sure that you can see:  Any grab bars or handrails.  First and last steps.  Where the edge of each step is.  Use tools that help you move around (mobility aids) if they are needed. These include:  Canes.  Walkers.  Scooters.  Crutches.  Turn on the lights when you go into a dark area. Replace any light bulbs as soon as they burn out.  Set up your furniture so you have a clear path. Avoid moving your furniture around.  If any of your floors are uneven, fix them.  If there are any pets around you, be aware of where they are.  Review your medicines with your doctor. Some medicines can make you feel dizzy. This can increase your chance of falling. Ask your doctor what other things that you can do to help prevent falls. This information is not intended to replace advice given to you by your health care provider. Make sure you discuss any questions you have with your health care provider. Document Released: 09/10/2009 Document Revised: 04/21/2016 Document Reviewed: 12/19/2014 Elsevier Interactive Patient Education  2017 [employer].

## 2021-03-17 DIAGNOSIS — R69 Illness, unspecified: Secondary | ICD-10-CM | POA: Diagnosis not present

## 2021-03-31 DIAGNOSIS — R69 Illness, unspecified: Secondary | ICD-10-CM | POA: Diagnosis not present

## 2021-04-15 DIAGNOSIS — R69 Illness, unspecified: Secondary | ICD-10-CM | POA: Diagnosis not present

## 2021-05-04 IMAGING — DX DG WRIST COMPLETE 3+V*R*
3 series · 3 of 3 positions shown · non-contrast
Comparison: None.

CLINICAL DATA: Right wrist sprain, pain

EXAM:
RIGHT WRIST - COMPLETE 3+ VIEW

[hand pa]
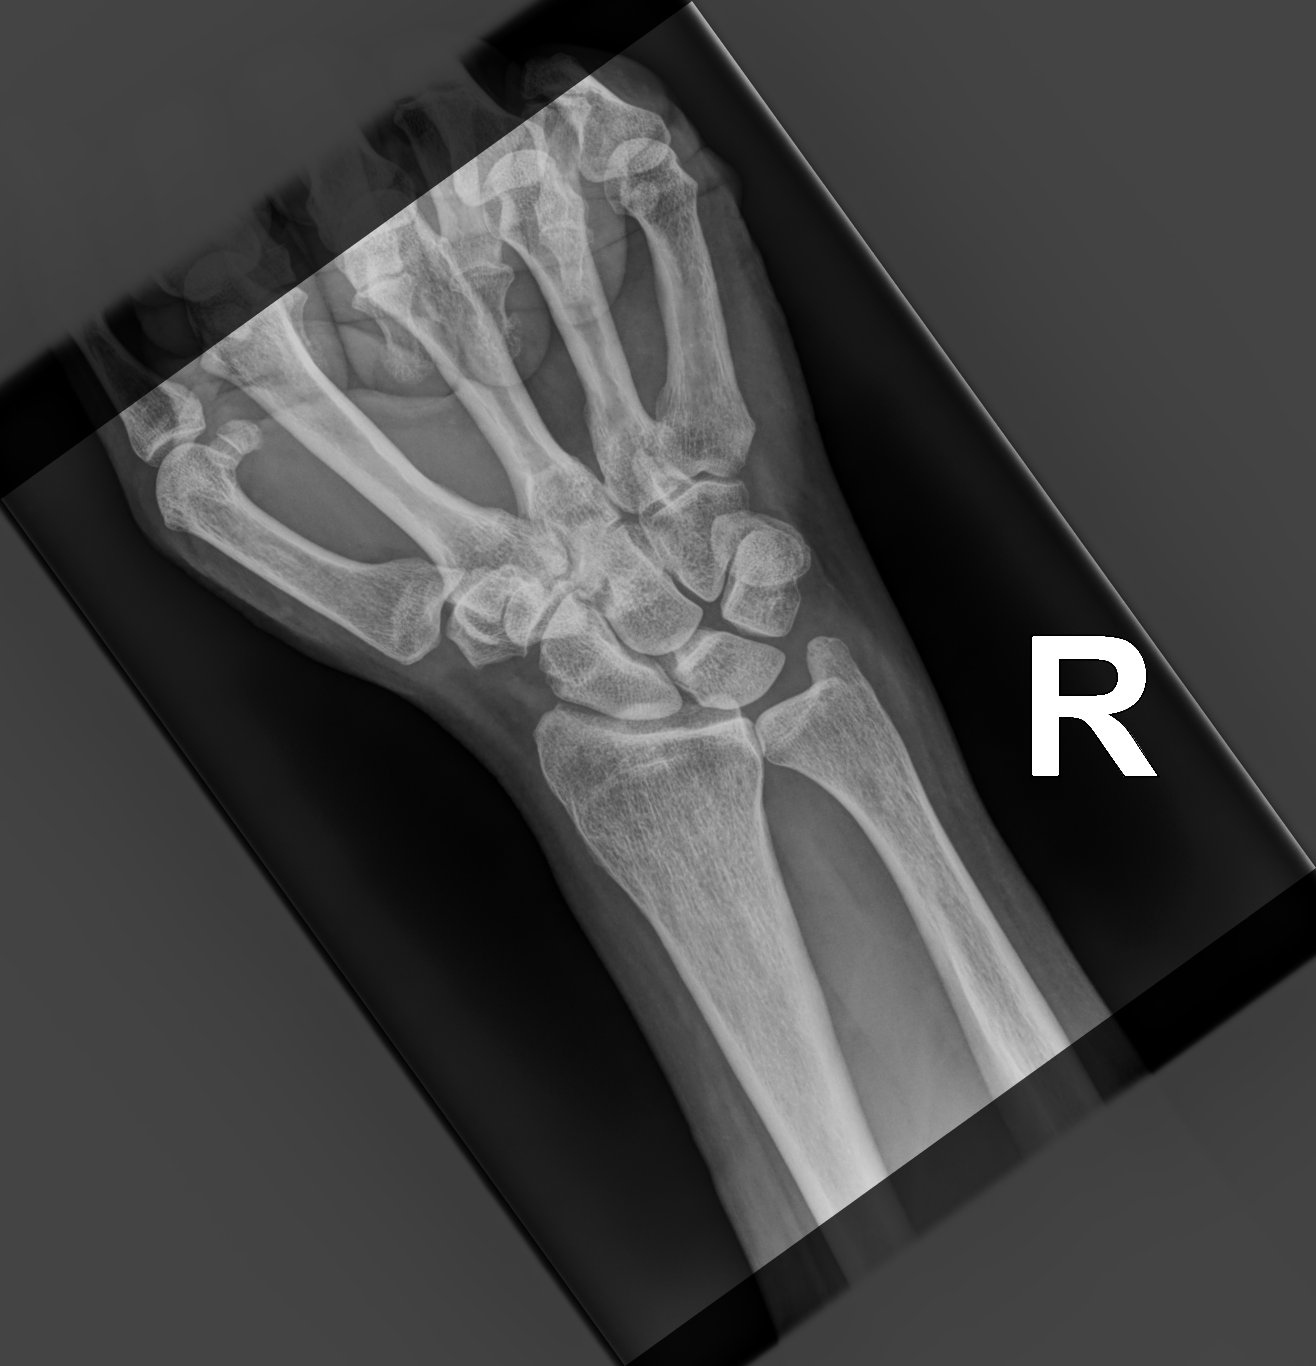

[hand mlo]
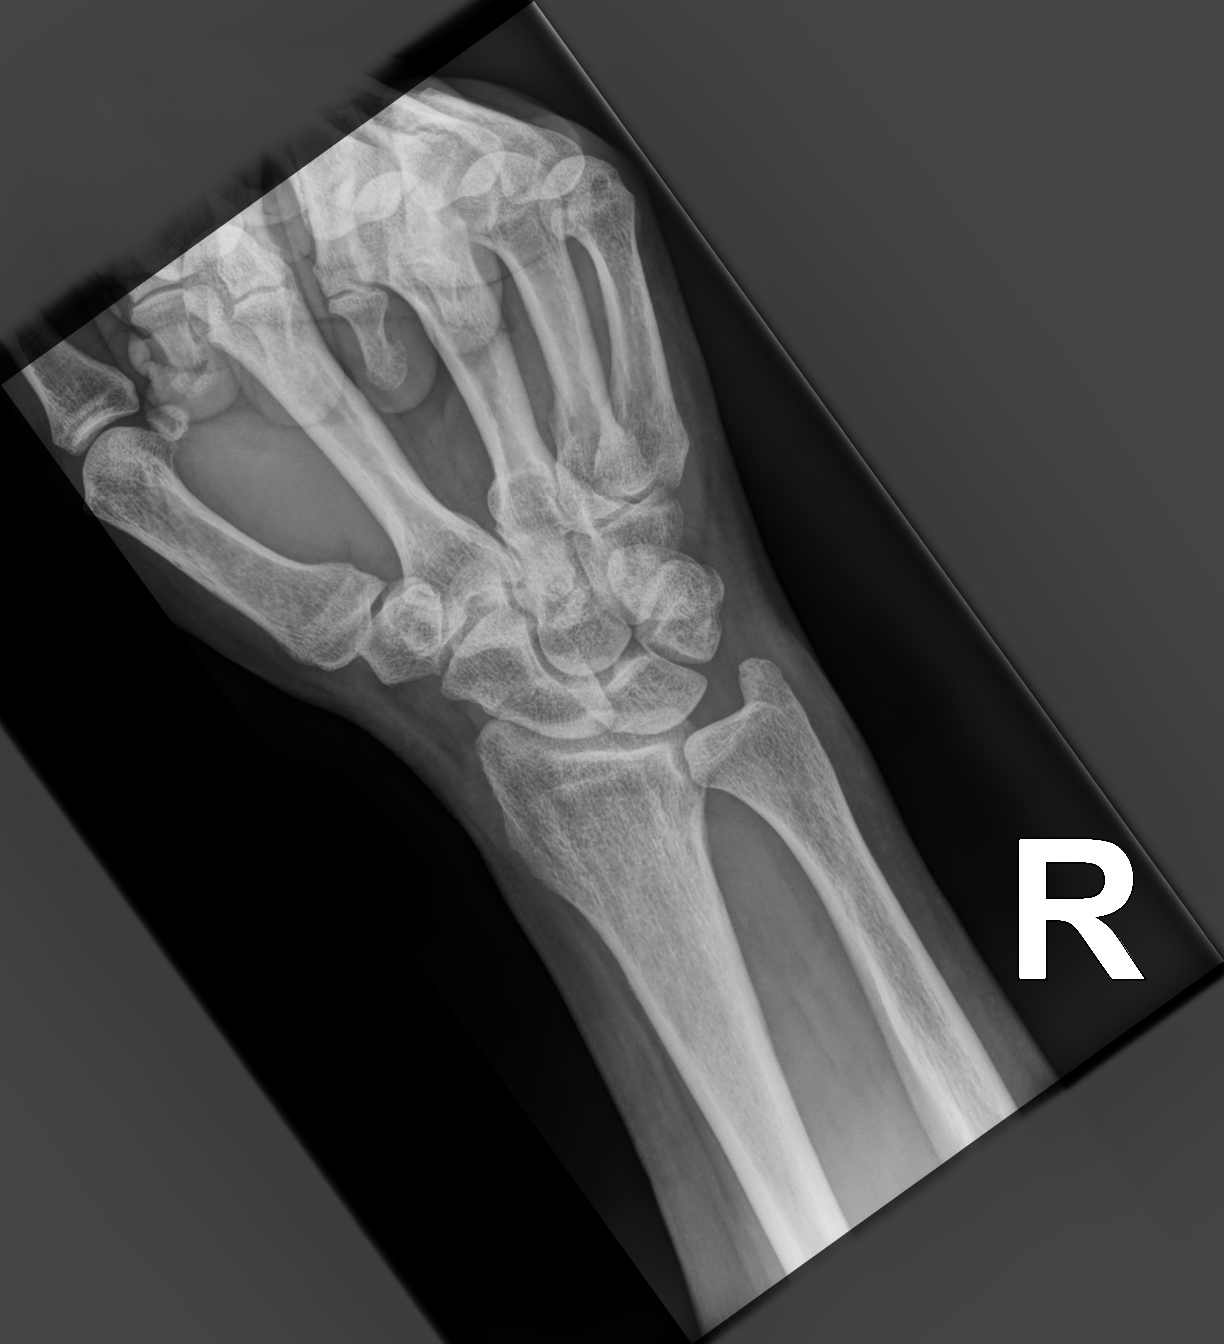

[hand lat]
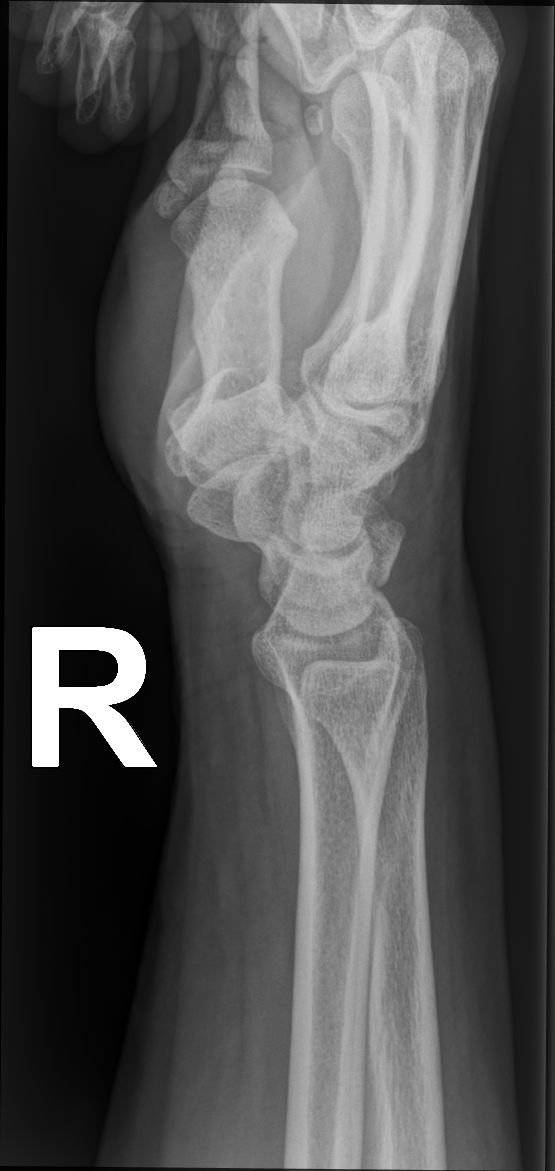

[3 of 3 positions shown; findings below may reference images not displayed]

FINDINGS: There is no evidence of fracture or dislocation. There is no
evidence of arthropathy or other focal bone abnormality. Soft
tissues are unremarkable.
IMPRESSION: Negative.

## 2021-05-06 DIAGNOSIS — R69 Illness, unspecified: Secondary | ICD-10-CM | POA: Diagnosis not present

## 2021-05-06 DIAGNOSIS — F3132 Bipolar disorder, current episode depressed, moderate: Secondary | ICD-10-CM | POA: Diagnosis not present

## 2021-05-06 DIAGNOSIS — F4310 Post-traumatic stress disorder, unspecified: Secondary | ICD-10-CM | POA: Diagnosis not present

## 2021-05-10 DIAGNOSIS — R69 Illness, unspecified: Secondary | ICD-10-CM | POA: Diagnosis not present

## 2021-05-10 DIAGNOSIS — M65871 Other synovitis and tenosynovitis, right ankle and foot: Secondary | ICD-10-CM | POA: Diagnosis not present

## 2021-05-10 DIAGNOSIS — S96811A Strain of other specified muscles and tendons at ankle and foot level, right foot, initial encounter: Secondary | ICD-10-CM | POA: Diagnosis not present

## 2021-05-10 DIAGNOSIS — Q740 Other congenital malformations of upper limb(s), including shoulder girdle: Secondary | ICD-10-CM | POA: Diagnosis not present

## 2021-05-10 DIAGNOSIS — M67971 Unspecified disorder of synovium and tendon, right ankle and foot: Secondary | ICD-10-CM | POA: Diagnosis not present

## 2021-05-17 ENCOUNTER — Ambulatory Visit: Payer: Medicare HMO | Admitting: Sports Medicine

## 2021-05-17 ENCOUNTER — Encounter: Payer: Self-pay | Admitting: Sports Medicine

## 2021-05-17 ENCOUNTER — Other Ambulatory Visit: Payer: Self-pay

## 2021-05-17 VITALS — BP 120/70 | Ht 67.0 in | Wt 200.0 lb

## 2021-05-17 DIAGNOSIS — R69 Illness, unspecified: Secondary | ICD-10-CM | POA: Diagnosis not present

## 2021-05-17 DIAGNOSIS — M76821 Posterior tibial tendinitis, right leg: Secondary | ICD-10-CM | POA: Diagnosis not present

## 2021-05-17 MED ORDER — MELOXICAM 15 MG PO TABS: 15.0000 mg | ORAL_TABLET | Freq: Every day | ORAL | 0 refills | Status: DC | PRN

## 2021-05-17 NOTE — Progress Notes (Signed)
Subjective:   Patient ID: [name]    DOB: 11/17/1966, 55 y.o. female   MRN: [id number]  [name] is a 55 y.o. female with a history of anxiety, bipolar disorder, fatigue, panic disorder, PTSD, vitamin D deficiency here for right ankle pain  Patient presents with worsening medial right ankle pain that radiates to the posterior medial malleolus.  The pain is worse when she stretches or with prolonged walking.  She feels like the pain started in November.  She saw her PCP but had normal x-rays.  She continues to deal with the pain but it continued to worsen.  She saw her chiropractor, Dr. 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  She notes that compression sleeve and her shoe inserts have been helping the pain.  She has been treating the pain with Aleve as needed which helps.  Denies any inciting trauma.  Prior Imaging: MRI Right Ankle 06/11/2018: "No right lateral ankle pain after injury working in yard 2 months prior" IMPRESSION: 1. Mild thickening and increased signal in the medial band of the plantar fascia at the calcaneal insertion most consistent withplantar fasciitis. 2. Moderate tendinosis of the peroneus brevis.   Right Foot X-ray 10/16/20: "medial right foot pain x several months, no injury" IMPRESSION: No acute findings. small inferior calcaneal spur present.  MRI Ankle 05/10/2021: "Pain in right ankle and joints of right foot" Impression: Severe posterior tibial tendinosis and tenosynovitis with partial tear at the level of the medial malleolus 2.  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  No significant abnormality in the forefoot   Review of Systems:  Per HPI.   Objective:   BP 120/70   Ht 5\' 7"  (1.702 m)   Wt 200 lb (90.7 kg)   LMP 09/16/2016 (Exact Date)   BMI 31.32 kg/m  Vitals and nursing note  reviewed.  General: pleasant older female, sitting comfortably in exam chair, well nourished, well developed, in no acute distress with non-toxic appearance Resp: breathing comfortably on room air, speaking in full sentences Skin: warm, dry Extremities: warm and well perfused, normal tone MSK: Antalgic gait favoring right  Right foot: Inspection:  No obvious bony deformity. Mild swelling to medial ankle, No erythema, or bruising.  Pes planus Palpation: Tenderness to palpation at navicular bone and following behind medial malleolus, remaining foot nontender ROM: Full  ROM of the ankle. Normal midfoot flexibility Strength: 5/5 strength ankle in flexion, extension, eversion.  4/5 strength in inversion Neurovascular: N/V intact distally in the lower extremity Special tests: Negative anterior drawer. Negative squeeze. normal midfoot flexibility. Abnormal calcaneal motion with heel raise  Left Foot: Inspection:  No obvious bony deformity.  No swelling, erythema, or bruising.  Pes planus Palpation: No tenderness to palpation ROM: Full  ROM of the ankle. Normal midfoot flexibility Strength: 5/5 strength ankle in all planes Neurovascular: N/V intact distally in the lower extremity Special tests: Negative anterior drawer. Negative squeeze. normal midfoot flexibility. 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  No prior trauma but MRI notable for severe posterior tibial tendinosis and tendo synovitis with partial tear.  Physical exam consistent with right posterior tibial tendon insufficiency and pes planus.  Recommended conservative management including good arch support,  ankle compression, and meloxicam QD.  Follow-up in 3 weeks to evaluate for improvement.  Can consider referral to physical therapy.  Activity as tolerated, ice as needed.   Discussed surgical referral per patient request.  She was amendable to conservative therapy at this time but if no improvement would like to consider referral to foot/ankle orthopedist for surgical consult.  No orders of the defined types were placed in this encounter.  Meds ordered this encounter  Medications   meloxicam (MOBIC) 15 MG tablet    Sig: Take 1 tablet (15 mg total) by mouth daily as needed for pain (ankle pain).    Dispense:  30 tablet    Refill:  0      Orpah Cobb, DO PGY-3, [facility] 05/17/2021 12:08 PM    Patient seen and evaluated with the resident.  I agree with the above plan of care.  Recent MRI of her right foot and ankle show severe posterior tibialis tendinosis with partial tearing at the level of the medial malleolus.  Treatment as above with continued arch support from custom orthotics.  She will also continue with a compression sleeve when active.  Meloxicam daily for 3 to 4 days then as needed for pain and swelling thereafter.  Follow-up with me again in 3 weeks for reevaluation.  If symptoms do not improve, patient may be interested in discussing surgical options with Dr. Nicki Guadalajara.

## 2021-05-17 NOTE — Assessment & Plan Note (Signed)
Chronic and worsening since November 2021.  No prior trauma but MRI notable for severe posterior tibial tendinosis and tendo synovitis with partial tear.  Physical exam consistent with right posterior tibial tendon insufficiency and pes planus.  Recommended conservative management including good arch support, ankle compression, and meloxicam QD.  Follow-up in 3 weeks to evaluate for improvement.  Can consider referral to physical therapy.  Activity as tolerated, ice as needed.  Discussed surgical referral per patient request.  She was amendable to conservative therapy at this time but if no improvement would like to consider referral to foot/ankle orthopedist for surgical consult.

## 2021-05-17 NOTE — Patient Instructions (Signed)
You have posterior tibial tendinopathy insufficiency which is causing your pain on the inside of your ankle.  The treatment for this primarily is focused on good arch support and ankle compression.  I would also like you to take meloxicam 15 mg daily for the next week.  You can use ice as needed to help with any inflammation.  I would like you to follow-up in about 3 weeks to see how things are going.  We could consider referral to physical therapy.  If you desire I can send a referral to the orthopedist for consult regarding surgery.  You may continue your normal activities as tolerated.

## 2021-05-25 DIAGNOSIS — R69 Illness, unspecified: Secondary | ICD-10-CM | POA: Diagnosis not present

## 2021-05-26 DIAGNOSIS — H5203 Hypermetropia, bilateral: Secondary | ICD-10-CM | POA: Diagnosis not present

## 2021-05-26 DIAGNOSIS — H10413 Chronic giant papillary conjunctivitis, bilateral: Secondary | ICD-10-CM | POA: Diagnosis not present

## 2021-05-26 DIAGNOSIS — H524 Presbyopia: Secondary | ICD-10-CM | POA: Diagnosis not present

## 2021-06-01 DIAGNOSIS — F319 Bipolar disorder, unspecified: Secondary | ICD-10-CM | POA: Diagnosis not present

## 2021-06-01 DIAGNOSIS — R69 Illness, unspecified: Secondary | ICD-10-CM | POA: Diagnosis not present

## 2021-06-08 ENCOUNTER — Other Ambulatory Visit: Payer: Self-pay

## 2021-06-08 ENCOUNTER — Ambulatory Visit (INDEPENDENT_AMBULATORY_CARE_PROVIDER_SITE_OTHER): Payer: Medicare HMO | Admitting: Sports Medicine

## 2021-06-08 VITALS — BP 114/60 | Ht 67.0 in | Wt 200.0 lb

## 2021-06-08 DIAGNOSIS — M76821 Posterior tibial tendinitis, right leg: Secondary | ICD-10-CM | POA: Diagnosis not present

## 2021-06-08 NOTE — Progress Notes (Signed)
   Subjective:    Patient ID: [name], female    DOB: 09-22-1966, 55 y.o.   MRN: [id number]  HPI  Nykerria presents today for follow-up on right foot and ankle pain secondary to posterior tibialis tendon insufficiency.  Overall, she has improved.  The pain that she was experiencing in her arch has resolved but she still has residual pain just posterior to the medial malleolus.  She still gets swelling in this area as well.  She has noticed benefit from her orthotics and compression sleeve.  She has only needed her meloxicam sparingly.    Review of Systems As above    Objective:   Physical Exam Well-developed, well-nourished.  Examination of the right foot in standing position still show significant pes planus.  Mild swelling just posterior to the medial malleolus.  She is tender to palpation at this point as well but no tenderness to palpation distally at the insertion of the navicular.  Full ankle range of motion.  Today she is able to stand on her tiptoes where normal calcaneal inversion is appreciated.  Good pulses.       Assessment & Plan:   Bilateral foot pain, right greater than left, secondary to posterior tibialis tendon insufficiency  I explained that this is going to be a chronic problem.  I have reinforced the importance of good arch support in her shoes.  She will continue with her compression sleeve as well.  She has meloxicam to use if needed.  Given her overall improvement, we will wait on surgical referral but if her pain worsens to the point that it is affecting her quality of life then we can reconsider a referral to Dr. Nicki Guadalajara.  Otherwise, follow-up as needed.

## 2021-06-09 ENCOUNTER — Encounter: Payer: Self-pay | Admitting: Family Medicine

## 2021-06-09 ENCOUNTER — Ambulatory Visit (INDEPENDENT_AMBULATORY_CARE_PROVIDER_SITE_OTHER): Payer: Medicare HMO | Admitting: Family Medicine

## 2021-06-09 VITALS — BP 110/78 | HR 75 | Temp 98.4°F | Wt 209.0 lb

## 2021-06-09 DIAGNOSIS — H6122 Impacted cerumen, left ear: Secondary | ICD-10-CM

## 2021-06-09 NOTE — Progress Notes (Signed)
   Subjective:    Patient ID: [name], female    DOB: [birth year], 55 y.o.   MRN: [id number]  HPI Here for 2 days of a pressure sensation in the left ear. No pain. No trouble hearing. She went swimming at a local pool yesterday.    Review of Systems  Constitutional: Negative.   HENT:  Negative for congestion, ear discharge, ear pain, hearing loss, postnasal drip, sinus pressure and sore throat.   Eyes: Negative.   Respiratory: Negative.        Objective:   Physical Exam Constitutional:      Appearance: Normal appearance.  HENT:     Right Ear: Tympanic membrane, ear canal and external ear normal.     Left Ear: There is impacted cerumen.     Nose: Nose normal.     Mouth/Throat:     Pharynx: Oropharynx is clear.  Eyes:     Conjunctiva/sclera: Conjunctivae normal.  Cardiovascular:     Rate and Rhythm: Normal rate and regular rhythm.     Pulses: Normal pulses.     Heart sounds: Normal heart sounds.  Pulmonary:     Effort: Pulmonary effort is normal.     Breath sounds: Normal breath sounds.  Lymphadenopathy:     Cervical: No cervical adenopathy.  Neurological:     Mental Status: She is alert.          Assessment & Plan:  Cerumen impaction. After informed consent was obtained, we irrigated the left ear canal clear with water she tolerated this well, and afterwards her canal was clear on exam. She immediately felt better and her ear felt normal.  Gershon Crane, MD

## 2021-06-11 ENCOUNTER — Ambulatory Visit: Payer: Medicare HMO | Admitting: Family Medicine

## 2021-06-15 DIAGNOSIS — R69 Illness, unspecified: Secondary | ICD-10-CM | POA: Diagnosis not present

## 2021-06-15 DIAGNOSIS — F319 Bipolar disorder, unspecified: Secondary | ICD-10-CM | POA: Diagnosis not present

## 2021-06-23 DIAGNOSIS — F319 Bipolar disorder, unspecified: Secondary | ICD-10-CM | POA: Diagnosis not present

## 2021-06-23 DIAGNOSIS — R69 Illness, unspecified: Secondary | ICD-10-CM | POA: Diagnosis not present

## 2021-06-30 DIAGNOSIS — F319 Bipolar disorder, unspecified: Secondary | ICD-10-CM | POA: Diagnosis not present

## 2021-06-30 DIAGNOSIS — R69 Illness, unspecified: Secondary | ICD-10-CM | POA: Diagnosis not present

## 2021-07-06 DIAGNOSIS — F319 Bipolar disorder, unspecified: Secondary | ICD-10-CM | POA: Diagnosis not present

## 2021-07-06 DIAGNOSIS — R69 Illness, unspecified: Secondary | ICD-10-CM | POA: Diagnosis not present

## 2021-07-08 ENCOUNTER — Ambulatory Visit (INDEPENDENT_AMBULATORY_CARE_PROVIDER_SITE_OTHER): Payer: Medicare HMO | Admitting: Sports Medicine

## 2021-07-08 ENCOUNTER — Other Ambulatory Visit: Payer: Self-pay

## 2021-07-08 VITALS — BP 122/86 | Ht 67.0 in | Wt 200.0 lb

## 2021-07-08 DIAGNOSIS — M76821 Posterior tibial tendinitis, right leg: Secondary | ICD-10-CM

## 2021-07-08 NOTE — Patient Instructions (Addendum)
We are going to refer you to Dr. Susa Simmonds for your right foot.  [facility] [address], [place] [phone]  Appt: 07/16/21 @ 1:15 pm. Please arrive at 12:45 pm  Please call their office if you need to reschedule.

## 2021-07-08 NOTE — Progress Notes (Signed)
   Subjective:    Patient ID: [name], female    DOB: 1966-04-07, 55 y.o.   MRN: [id number]  HPI  [name] comes in today to discuss ongoing right ankle pain secondary to posterior tibialis tendon insufficiency.  Recent MRI of her right ankle done at [facility] showed severe tendinosis and partial tearing of the posterior tibialis tendon at the level of the malleolus.  She also has a rather sizable accessory navicular.  She has been treated with orthotics, compression, oral anti-inflammatories.  Although her symptoms have improved, they are continuing to bother her to the point that she is here today to discuss other treatment options.  She localizes most of her pain to the posterior medial malleolus.  Previous pain she was experiencing in the arch of the foot has resolved.    Review of Systems As above    Objective:   Physical Exam  Well-developed, well-nourished.  No acute distress  Right ankle: Significant pes planus.  There is tenderness to palpation along the posterior tibialis tendon at the level of the medial malleolus.  No significant tenderness at the navicular.  She is able to stand on her tiptoes but calcaneal inversion is reduced on the right compared to the left.  "Too many toes sign" and calcaneal valgus noted.  Good subtalar motion.  Good pulses.  Walking with a limp.  Limited MSK ultrasound of the medial malleolus today shows significant hypoechoic changes through the midportion of the posterior tibialis tendon at the level of the medial malleolus.  There is significant fluid surrounding the tendon at this level as well.  Findings correlate with what was seen on MRI earlier this year.      Assessment & Plan:   Chronic right foot and ankle pain secondary to posterior tibialis tendon insufficiency with ultrasound and MRI evidence of significant tendinosis and partial tearing  Given the patient's failure to improve with conservative measures, I recommended consultation with  Dr. Susa Simmonds to discuss other treatment options.  Patient is in agreement with that plan.  I will defer further work-up and treatment to the discretion of Dr. Susa Simmonds.  She will follow-up with me as needed.

## 2021-07-16 DIAGNOSIS — M79671 Pain in right foot: Secondary | ICD-10-CM | POA: Diagnosis not present

## 2021-07-21 DIAGNOSIS — R69 Illness, unspecified: Secondary | ICD-10-CM | POA: Diagnosis not present

## 2021-07-21 DIAGNOSIS — F319 Bipolar disorder, unspecified: Secondary | ICD-10-CM | POA: Diagnosis not present

## 2021-07-29 DIAGNOSIS — F319 Bipolar disorder, unspecified: Secondary | ICD-10-CM | POA: Diagnosis not present

## 2021-07-29 DIAGNOSIS — R69 Illness, unspecified: Secondary | ICD-10-CM | POA: Diagnosis not present

## 2021-08-03 DIAGNOSIS — F4310 Post-traumatic stress disorder, unspecified: Secondary | ICD-10-CM | POA: Diagnosis not present

## 2021-08-03 DIAGNOSIS — R69 Illness, unspecified: Secondary | ICD-10-CM | POA: Diagnosis not present

## 2021-08-03 DIAGNOSIS — F3132 Bipolar disorder, current episode depressed, moderate: Secondary | ICD-10-CM | POA: Diagnosis not present

## 2021-08-04 DIAGNOSIS — M76821 Posterior tibial tendinitis, right leg: Secondary | ICD-10-CM | POA: Diagnosis not present

## 2021-08-04 DIAGNOSIS — M25671 Stiffness of right ankle, not elsewhere classified: Secondary | ICD-10-CM | POA: Diagnosis not present

## 2021-08-04 DIAGNOSIS — M25571 Pain in right ankle and joints of right foot: Secondary | ICD-10-CM | POA: Diagnosis not present

## 2021-08-04 DIAGNOSIS — R262 Difficulty in walking, not elsewhere classified: Secondary | ICD-10-CM | POA: Diagnosis not present

## 2021-08-06 DIAGNOSIS — M25671 Stiffness of right ankle, not elsewhere classified: Secondary | ICD-10-CM | POA: Diagnosis not present

## 2021-08-06 DIAGNOSIS — M76821 Posterior tibial tendinitis, right leg: Secondary | ICD-10-CM | POA: Diagnosis not present

## 2021-08-06 DIAGNOSIS — M25571 Pain in right ankle and joints of right foot: Secondary | ICD-10-CM | POA: Diagnosis not present

## 2021-08-06 DIAGNOSIS — R262 Difficulty in walking, not elsewhere classified: Secondary | ICD-10-CM | POA: Diagnosis not present

## 2021-08-09 DIAGNOSIS — M76821 Posterior tibial tendinitis, right leg: Secondary | ICD-10-CM | POA: Diagnosis not present

## 2021-08-09 DIAGNOSIS — M25571 Pain in right ankle and joints of right foot: Secondary | ICD-10-CM | POA: Diagnosis not present

## 2021-08-09 DIAGNOSIS — R262 Difficulty in walking, not elsewhere classified: Secondary | ICD-10-CM | POA: Diagnosis not present

## 2021-08-09 DIAGNOSIS — M25671 Stiffness of right ankle, not elsewhere classified: Secondary | ICD-10-CM | POA: Diagnosis not present

## 2021-08-10 ENCOUNTER — Other Ambulatory Visit: Payer: Self-pay | Admitting: *Deleted

## 2021-08-10 DIAGNOSIS — M76821 Posterior tibial tendinitis, right leg: Secondary | ICD-10-CM

## 2021-08-10 MED ORDER — MELOXICAM 15 MG PO TABS: 15.0000 mg | ORAL_TABLET | Freq: Every day | ORAL | 0 refills | Status: DC | PRN

## 2021-08-11 DIAGNOSIS — F319 Bipolar disorder, unspecified: Secondary | ICD-10-CM | POA: Diagnosis not present

## 2021-08-11 DIAGNOSIS — R69 Illness, unspecified: Secondary | ICD-10-CM | POA: Diagnosis not present

## 2021-08-13 DIAGNOSIS — M25671 Stiffness of right ankle, not elsewhere classified: Secondary | ICD-10-CM | POA: Diagnosis not present

## 2021-08-13 DIAGNOSIS — M76821 Posterior tibial tendinitis, right leg: Secondary | ICD-10-CM | POA: Diagnosis not present

## 2021-08-13 DIAGNOSIS — R262 Difficulty in walking, not elsewhere classified: Secondary | ICD-10-CM | POA: Diagnosis not present

## 2021-08-13 DIAGNOSIS — M25571 Pain in right ankle and joints of right foot: Secondary | ICD-10-CM | POA: Diagnosis not present

## 2021-08-16 DIAGNOSIS — M76821 Posterior tibial tendinitis, right leg: Secondary | ICD-10-CM | POA: Diagnosis not present

## 2021-08-16 DIAGNOSIS — M25571 Pain in right ankle and joints of right foot: Secondary | ICD-10-CM | POA: Diagnosis not present

## 2021-08-16 DIAGNOSIS — R262 Difficulty in walking, not elsewhere classified: Secondary | ICD-10-CM | POA: Diagnosis not present

## 2021-08-16 DIAGNOSIS — M25671 Stiffness of right ankle, not elsewhere classified: Secondary | ICD-10-CM | POA: Diagnosis not present

## 2021-08-18 DIAGNOSIS — R262 Difficulty in walking, not elsewhere classified: Secondary | ICD-10-CM | POA: Diagnosis not present

## 2021-08-18 DIAGNOSIS — M76821 Posterior tibial tendinitis, right leg: Secondary | ICD-10-CM | POA: Diagnosis not present

## 2021-08-18 DIAGNOSIS — M25571 Pain in right ankle and joints of right foot: Secondary | ICD-10-CM | POA: Diagnosis not present

## 2021-08-18 DIAGNOSIS — M25671 Stiffness of right ankle, not elsewhere classified: Secondary | ICD-10-CM | POA: Diagnosis not present

## 2021-08-20 DIAGNOSIS — R262 Difficulty in walking, not elsewhere classified: Secondary | ICD-10-CM | POA: Diagnosis not present

## 2021-08-20 DIAGNOSIS — M25671 Stiffness of right ankle, not elsewhere classified: Secondary | ICD-10-CM | POA: Diagnosis not present

## 2021-08-20 DIAGNOSIS — M25571 Pain in right ankle and joints of right foot: Secondary | ICD-10-CM | POA: Diagnosis not present

## 2021-08-20 DIAGNOSIS — M76821 Posterior tibial tendinitis, right leg: Secondary | ICD-10-CM | POA: Diagnosis not present

## 2021-08-23 DIAGNOSIS — R262 Difficulty in walking, not elsewhere classified: Secondary | ICD-10-CM | POA: Diagnosis not present

## 2021-08-23 DIAGNOSIS — M76821 Posterior tibial tendinitis, right leg: Secondary | ICD-10-CM | POA: Diagnosis not present

## 2021-08-23 DIAGNOSIS — M25571 Pain in right ankle and joints of right foot: Secondary | ICD-10-CM | POA: Diagnosis not present

## 2021-08-23 DIAGNOSIS — M25671 Stiffness of right ankle, not elsewhere classified: Secondary | ICD-10-CM | POA: Diagnosis not present

## 2021-08-24 DIAGNOSIS — M25671 Stiffness of right ankle, not elsewhere classified: Secondary | ICD-10-CM | POA: Diagnosis not present

## 2021-08-24 DIAGNOSIS — M76821 Posterior tibial tendinitis, right leg: Secondary | ICD-10-CM | POA: Diagnosis not present

## 2021-08-24 DIAGNOSIS — M25571 Pain in right ankle and joints of right foot: Secondary | ICD-10-CM | POA: Diagnosis not present

## 2021-08-24 DIAGNOSIS — R262 Difficulty in walking, not elsewhere classified: Secondary | ICD-10-CM | POA: Diagnosis not present

## 2021-08-25 DIAGNOSIS — F319 Bipolar disorder, unspecified: Secondary | ICD-10-CM | POA: Diagnosis not present

## 2021-08-25 DIAGNOSIS — F4310 Post-traumatic stress disorder, unspecified: Secondary | ICD-10-CM | POA: Diagnosis not present

## 2021-08-25 DIAGNOSIS — R69 Illness, unspecified: Secondary | ICD-10-CM | POA: Diagnosis not present

## 2021-08-25 DIAGNOSIS — F3132 Bipolar disorder, current episode depressed, moderate: Secondary | ICD-10-CM | POA: Diagnosis not present

## 2021-08-31 DIAGNOSIS — M25671 Stiffness of right ankle, not elsewhere classified: Secondary | ICD-10-CM | POA: Diagnosis not present

## 2021-08-31 DIAGNOSIS — R262 Difficulty in walking, not elsewhere classified: Secondary | ICD-10-CM | POA: Diagnosis not present

## 2021-08-31 DIAGNOSIS — M76821 Posterior tibial tendinitis, right leg: Secondary | ICD-10-CM | POA: Diagnosis not present

## 2021-08-31 DIAGNOSIS — M25571 Pain in right ankle and joints of right foot: Secondary | ICD-10-CM | POA: Diagnosis not present

## 2021-09-03 DIAGNOSIS — M76821 Posterior tibial tendinitis, right leg: Secondary | ICD-10-CM | POA: Diagnosis not present

## 2021-09-03 DIAGNOSIS — M25671 Stiffness of right ankle, not elsewhere classified: Secondary | ICD-10-CM | POA: Diagnosis not present

## 2021-09-03 DIAGNOSIS — M25571 Pain in right ankle and joints of right foot: Secondary | ICD-10-CM | POA: Diagnosis not present

## 2021-09-03 DIAGNOSIS — R262 Difficulty in walking, not elsewhere classified: Secondary | ICD-10-CM | POA: Diagnosis not present

## 2021-09-22 DIAGNOSIS — F319 Bipolar disorder, unspecified: Secondary | ICD-10-CM | POA: Diagnosis not present

## 2021-09-22 DIAGNOSIS — R69 Illness, unspecified: Secondary | ICD-10-CM | POA: Diagnosis not present

## 2021-10-13 ENCOUNTER — Other Ambulatory Visit: Payer: Self-pay | Admitting: Sports Medicine

## 2021-10-13 DIAGNOSIS — M76821 Posterior tibial tendinitis, right leg: Secondary | ICD-10-CM

## 2021-10-28 DIAGNOSIS — R69 Illness, unspecified: Secondary | ICD-10-CM | POA: Diagnosis not present

## 2021-10-28 DIAGNOSIS — F319 Bipolar disorder, unspecified: Secondary | ICD-10-CM | POA: Diagnosis not present

## 2021-11-01 DIAGNOSIS — M79671 Pain in right foot: Secondary | ICD-10-CM | POA: Diagnosis not present

## 2021-11-01 DIAGNOSIS — F3132 Bipolar disorder, current episode depressed, moderate: Secondary | ICD-10-CM | POA: Diagnosis not present

## 2021-11-01 DIAGNOSIS — M25571 Pain in right ankle and joints of right foot: Secondary | ICD-10-CM | POA: Diagnosis not present

## 2021-11-01 DIAGNOSIS — F4310 Post-traumatic stress disorder, unspecified: Secondary | ICD-10-CM | POA: Diagnosis not present

## 2021-11-01 DIAGNOSIS — R69 Illness, unspecified: Secondary | ICD-10-CM | POA: Diagnosis not present

## 2021-11-05 DIAGNOSIS — Z01419 Encounter for gynecological examination (general) (routine) without abnormal findings: Secondary | ICD-10-CM | POA: Diagnosis not present

## 2021-11-10 DIAGNOSIS — R69 Illness, unspecified: Secondary | ICD-10-CM | POA: Diagnosis not present

## 2021-11-10 DIAGNOSIS — F319 Bipolar disorder, unspecified: Secondary | ICD-10-CM | POA: Diagnosis not present

## 2021-12-01 DIAGNOSIS — R69 Illness, unspecified: Secondary | ICD-10-CM | POA: Diagnosis not present

## 2021-12-02 DIAGNOSIS — F319 Bipolar disorder, unspecified: Secondary | ICD-10-CM | POA: Diagnosis not present

## 2021-12-02 DIAGNOSIS — R69 Illness, unspecified: Secondary | ICD-10-CM | POA: Diagnosis not present

## 2021-12-16 DIAGNOSIS — R69 Illness, unspecified: Secondary | ICD-10-CM | POA: Diagnosis not present

## 2021-12-16 DIAGNOSIS — F319 Bipolar disorder, unspecified: Secondary | ICD-10-CM | POA: Diagnosis not present

## 2022-01-03 DIAGNOSIS — R69 Illness, unspecified: Secondary | ICD-10-CM | POA: Diagnosis not present

## 2022-01-03 DIAGNOSIS — F319 Bipolar disorder, unspecified: Secondary | ICD-10-CM | POA: Diagnosis not present

## 2022-01-13 DIAGNOSIS — M25571 Pain in right ankle and joints of right foot: Secondary | ICD-10-CM | POA: Diagnosis not present

## 2022-01-18 DIAGNOSIS — M67873 Other specified disorders of tendon, right ankle and foot: Secondary | ICD-10-CM | POA: Diagnosis not present

## 2022-01-18 DIAGNOSIS — Y999 Unspecified external cause status: Secondary | ICD-10-CM | POA: Diagnosis not present

## 2022-01-18 DIAGNOSIS — G8918 Other acute postprocedural pain: Secondary | ICD-10-CM | POA: Diagnosis not present

## 2022-01-18 DIAGNOSIS — S93491A Sprain of other ligament of right ankle, initial encounter: Secondary | ICD-10-CM | POA: Diagnosis not present

## 2022-01-18 DIAGNOSIS — M2141 Flat foot [pes planus] (acquired), right foot: Secondary | ICD-10-CM | POA: Diagnosis not present

## 2022-01-18 DIAGNOSIS — X58XXXA Exposure to other specified factors, initial encounter: Secondary | ICD-10-CM | POA: Diagnosis not present

## 2022-01-18 DIAGNOSIS — S93411A Sprain of calcaneofibular ligament of right ankle, initial encounter: Secondary | ICD-10-CM | POA: Diagnosis not present

## 2022-01-26 DIAGNOSIS — F319 Bipolar disorder, unspecified: Secondary | ICD-10-CM | POA: Diagnosis not present

## 2022-01-26 DIAGNOSIS — R69 Illness, unspecified: Secondary | ICD-10-CM | POA: Diagnosis not present

## 2022-01-27 ENCOUNTER — Telehealth: Payer: Self-pay | Admitting: Family Medicine

## 2022-01-27 DIAGNOSIS — F4310 Post-traumatic stress disorder, unspecified: Secondary | ICD-10-CM | POA: Diagnosis not present

## 2022-01-27 DIAGNOSIS — F3132 Bipolar disorder, current episode depressed, moderate: Secondary | ICD-10-CM | POA: Diagnosis not present

## 2022-01-27 DIAGNOSIS — R69 Illness, unspecified: Secondary | ICD-10-CM | POA: Diagnosis not present

## 2022-01-27 NOTE — Telephone Encounter (Signed)
Patient called in requesting a phone call back. I asked her what the call would be about but patient declined to tell me and stated that she would just tell Dr.Koberlein. ? ?Please advise. ?

## 2022-01-28 NOTE — Telephone Encounter (Signed)
I called patient. Her psychiatrist is retiring and she wondered if we could take over prescribing (said psychiatry suggested asking). She has been stable on current medications for years. I think it would be fine for [place] to manage, but did ask her to call and make appointment since I haven't seen her since 09/2020 (she had other visits in our office since then, but not with primary).  ?

## 2022-01-31 DIAGNOSIS — M25571 Pain in right ankle and joints of right foot: Secondary | ICD-10-CM | POA: Diagnosis not present

## 2022-02-21 DIAGNOSIS — R69 Illness, unspecified: Secondary | ICD-10-CM | POA: Diagnosis not present

## 2022-02-21 DIAGNOSIS — F319 Bipolar disorder, unspecified: Secondary | ICD-10-CM | POA: Diagnosis not present

## 2022-02-28 DIAGNOSIS — Z9889 Other specified postprocedural states: Secondary | ICD-10-CM | POA: Diagnosis not present

## 2022-02-28 DIAGNOSIS — M25571 Pain in right ankle and joints of right foot: Secondary | ICD-10-CM | POA: Diagnosis not present

## 2022-03-09 ENCOUNTER — Ambulatory Visit: Payer: Medicare HMO

## 2022-03-09 ENCOUNTER — Ambulatory Visit (INDEPENDENT_AMBULATORY_CARE_PROVIDER_SITE_OTHER): Payer: Medicare HMO

## 2022-03-09 VITALS — Ht 67.0 in | Wt 200.0 lb

## 2022-03-09 DIAGNOSIS — Z0000 Encounter for general adult medical examination without abnormal findings: Secondary | ICD-10-CM | POA: Diagnosis not present

## 2022-03-09 DIAGNOSIS — M6281 Muscle weakness (generalized): Secondary | ICD-10-CM | POA: Diagnosis not present

## 2022-03-09 DIAGNOSIS — M76821 Posterior tibial tendinitis, right leg: Secondary | ICD-10-CM | POA: Diagnosis not present

## 2022-03-09 DIAGNOSIS — R69 Illness, unspecified: Secondary | ICD-10-CM | POA: Diagnosis not present

## 2022-03-09 DIAGNOSIS — F319 Bipolar disorder, unspecified: Secondary | ICD-10-CM | POA: Diagnosis not present

## 2022-03-09 DIAGNOSIS — M25671 Stiffness of right ankle, not elsewhere classified: Secondary | ICD-10-CM | POA: Diagnosis not present

## 2022-03-09 NOTE — Patient Instructions (Addendum)
?[name] , ?Thank you for taking time to come for your Medicare Wellness Visit. I appreciate your ongoing commitment to your health goals. Please review the following plan we discussed and let me know if I can assist you in the future.  ? ?These are the goals we discussed: ? Goals   ? ?   Patient Stated (pt-stated)   ?   I want to be able to walk when My foot gets better. ?  ? ?  ?  ?This is a list of the screening recommended for you and due dates:  ?Health Maintenance  ?Topic Date Due  ? COVID-19 Vaccine (3 - Pfizer risk series) 03/25/2022*  ? Zoster (Shingles) Vaccine (1 of 2) 06/08/2022*  ? Mammogram  03/10/2023*  ? HIV Screening  06/20/2026*  ? Flu Shot  06/28/2022  ? Pap Smear  06/28/2022  ? Tetanus Vaccine  12/31/2028  ? Colon Cancer Screening  09/13/2030  ? Hepatitis C Screening: USPSTF Recommendation to screen - Ages 77-79 yo.  Completed  ? HPV Vaccine  Aged Out  ?*Topic was postponed. The date shown is not the original due date.  ? ?Advanced directives: No ? ?Conditions/risks identified: None ? ?Next appointment: Follow up in one year for your annual wellness visit.  ? ?Preventive Care 40-64 Years, Female ?Preventive care refers to lifestyle choices and visits with your health care provider that can promote health and wellness. ?What does preventive care include? ?A yearly physical exam. This is also called an annual well check. ?Dental exams once or twice a year. ?Routine eye exams. Ask your health care provider how often you should have your eyes checked. ?Personal lifestyle choices, including: ?Daily care of your teeth and gums. ?Regular physical activity. ?Eating a healthy diet. ?Avoiding tobacco and drug use. ?Limiting alcohol use. ?Practicing safe sex. ?Taking low-dose aspirin daily starting at age 40. ?Taking vitamin and mineral supplements as recommended by your health care provider. ?What happens during an annual well check? ?The services and screenings done by your health care provider  during your annual well check will depend on your age, overall health, lifestyle risk factors, and family history of disease. ?Counseling  ?Your health care provider may ask you questions about your: ?Alcohol use. ?Tobacco use. ?Drug use. ?Emotional well-being. ?Home and relationship well-being. ?Sexual activity. ?Eating habits. ?Work and work Statistician. ?Method of birth control. ?Menstrual cycle. ?Pregnancy history. ?Screening  ?You may have the following tests or measurements: ?Height, weight, and BMI. ?Blood pressure. ?Lipid and cholesterol levels. These may be checked every 5 years, or more frequently if you are over 51 years old. ?Skin check. ?Lung cancer screening. You may have this screening every year starting at age 33 if you have a 30-pack-year history of smoking and currently smoke or have quit within the past 15 years. ?Fecal occult blood test (FOBT) of the stool. You may have this test every year starting at age 90. ?Flexible sigmoidoscopy or colonoscopy. You may have a sigmoidoscopy every 5 years or a colonoscopy every 10 years starting at age 34. ?Hepatitis C blood test. ?Hepatitis B blood test. ?Sexually transmitted disease (STD) testing. ?Diabetes screening. This is done by checking your blood sugar (glucose) after you have not eaten for a while (fasting). You may have this done every 1-3 years. ?Mammogram. This may be done every 1-2 years. Talk to your health care provider about when you should start having regular mammograms. This may depend on whether you have a family history of breast cancer. ?  BRCA-related cancer screening. This may be done if you have a family history of breast, ovarian, tubal, or peritoneal cancers. ?Pelvic exam and Pap test. This may be done every 3 years starting at age 67. Starting at age 18, this may be done every 5 years if you have a Pap test in combination with an HPV test. ?Bone density scan. This is done to screen for osteoporosis. You may have this scan if you are  at high risk for osteoporosis. ?Discuss your test results, treatment options, and if necessary, the need for more tests with your health care provider. ?Vaccines  ?Your health care provider may recommend certain vaccines, such as: ?Influenza vaccine. This is recommended every year. ?Tetanus, diphtheria, and acellular pertussis (Tdap, Td) vaccine. You may need a Td booster every 10 years. ?Zoster vaccine. You may need this after age 90. ?Pneumococcal 13-valent conjugate (PCV13) vaccine. You may need this if you have certain conditions and were not previously vaccinated. ?Pneumococcal polysaccharide (PPSV23) vaccine. You may need one or two doses if you smoke cigarettes or if you have certain conditions. ?Talk to your health care provider about which screenings and vaccines you need and how often you need them. ?This information is not intended to replace advice given to you by your health care provider. Make sure you discuss any questions you have with your health care provider. ?Document Released: 12/11/2015 Document Revised: 08/03/2016 Document Reviewed: 09/15/2015 ?Elsevier Interactive Patient Education ? 2017 [employer] ? ? ? ?Fall Prevention in the Home ?Falls can cause injuries. They can happen to people of all ages. There are many things you can do to make your home safe and to help prevent falls. ?What can I do on the outside of my home? ?Regularly fix the edges of walkways and driveways and fix any cracks. ?Remove anything that might make you trip as you walk through a door, such as a raised step or threshold. ?Trim any bushes or trees on the path to your home. ?Use bright outdoor lighting. ?Clear any walking paths of anything that might make someone trip, such as rocks or tools. ?Regularly check to see if handrails are loose or broken. Make sure that both sides of any steps have handrails. ?Any raised decks and porches should have guardrails on the edges. ?Have any leaves, snow, or ice cleared  regularly. ?Use sand or salt on walking paths during winter. ?Clean up any spills in your garage right away. This includes oil or grease spills. ?What can I do in the bathroom? ?Use night lights. ?Install grab bars by the toilet and in the tub and shower. Do not use towel bars as grab bars. ?Use non-skid mats or decals in the tub or shower. ?If you need to sit down in the shower, use a plastic, non-slip stool. ?Keep the floor dry. Clean up any water that spills on the floor as soon as it happens. ?Remove soap buildup in the tub or shower regularly. ?Attach bath mats securely with double-sided non-slip rug tape. ?Do not have throw rugs and other things on the floor that can make you trip. ?What can I do in the bedroom? ?Use night lights. ?Make sure that you have a light by your bed that is easy to reach. ?Do not use any sheets or blankets that are too big for your bed. They should not hang down onto the floor. ?Have a firm chair that has side arms. You can use this for support while you get dressed. ?Do not have  throw rugs and other things on the floor that can make you trip. ?What can I do in the kitchen? ?Clean up any spills right away. ?Avoid walking on wet floors. ?Keep items that you use a lot in easy-to-reach places. ?If you need to reach something above you, use a strong step stool that has a grab bar. ?Keep electrical cords out of the way. ?Do not use floor polish or wax that makes floors slippery. If you must use wax, use non-skid floor wax. ?Do not have throw rugs and other things on the floor that can make you trip. ?What can I do with my stairs? ?Do not leave any items on the stairs. ?Make sure that there are handrails on both sides of the stairs and use them. Fix handrails that are broken or loose. Make sure that handrails are as long as the stairways. ?Check any carpeting to make sure that it is firmly attached to the stairs. Fix any carpet that is loose or worn. ?Avoid having throw rugs at the top or  bottom of the stairs. If you do have throw rugs, attach them to the floor with carpet tape. ?Make sure that you have a light switch at the top of the stairs and the bottom of the stairs. If you do not have them, as

## 2022-03-09 NOTE — Progress Notes (Signed)
? ?Subjective:  ? [name] is a 56 y.o. female who presents for Medicare Annual (Subsequent) preventive examination. ? ?Review of Systems    ?Virtual Visit via Telephone Note ? ?I connected with  [name] on 03/09/22 at 11:00 AM EDT by telephone and verified that I am speaking with the correct person using two identifiers. ? ?Location: ?Patient: Home ?Provider: Office ?Persons participating in the virtual visit: patient/Nurse Health Advisor ?  ?I discussed the limitations, risks, security and privacy concerns of performing an evaluation and management service by telephone and the availability of in person appointments. The patient expressed understanding and agreed to proceed. ? ?Interactive audio and video telecommunications were attempted between this nurse and patient, however failed, due to patient having technical difficulties OR patient did not have access to video capability.  We continued and completed visit with audio only. ? ?Some vital signs may be absent or patient reported.  ? ?Kristin Rung, LPN  ?  ? ?   ?Objective:  ?  ?Today's Vitals  ? 03/09/22 1106  ?Weight: 200 lb (90.7 kg)  ?Height: 5\' 7"  (1.702 m)  ? ?Body mass index is 31.32 kg/m?. ? ? ?  03/09/2022  ? 11:17 AM 03/03/2021  ? 11:07 AM  ?Advanced Directives  ?Does Patient Have a Medical Advance Directive? No No  ?Would patient like information on creating a medical advance directive? No - Patient declined Yes (MAU/Ambulatory/Procedural Areas - Information given)  ? ? ?Current Medications (verified) ?Outpatient Encounter Medications as of 03/09/2022  ?Medication Sig  ? buPROPion HCl (WELLBUTRIN PO) Take by mouth daily at 12 noon.  ? clonazePAM (KLONOPIN) 0.5 MG tablet TAKE ONE TABLET (0.5 MG DOSE) BY MOUTH DAILY AS NEEDED FOR ANXIETY. 90 DAY SUPPLY  ? lamoTRIgine (LAMICTAL PO) Take 75 mg by mouth daily.  ? meloxicam (MOBIC) 15 MG tablet Take 1 tablet (15 mg total) by mouth daily as needed for pain (ankle pain).  ? traZODone  (DESYREL) 25 mg TABS tablet Take 12.5 mg by mouth at bedtime.   ? ?No facility-administered encounter medications on file as of 03/09/2022.  ? ? ?Allergies (verified) ?Patient has no known allergies.  ? ?History: ?Past Medical History:  ?Diagnosis Date  ? ALLERGIC RHINITIS 01/10/2008  ? ANXIETY 01/10/2008  ? ATTENTION DEFICIT HYPERACTIVITY DISORDER, ADULT 10/28/2009  ? BIPOLAR DISORDER UNSPECIFIED 01/10/2008  ? CHEST PAIN, ATYPICAL 01/10/2008  ? DEPRESSION 01/10/2008  ? INSOMNIA 09/17/2010  ? PANIC DISORDER 01/10/2008  ? PHLEBITIS\T\THROMBOPHLEB SUP VESSELS LOWER EXTREM 07/05/2010  ? PLANTAR WART, LEFT 12/29/2008  ? Sebaceous cyst   ? scalp, seeing general surgeon  ? ?Past Surgical History:  ?Procedure Laterality Date  ? inguinal hernia repair Right   ? 2009  ? knee surgery    ? patella fx, 2010  ? TONSILLECTOMY    ? ?Family History  ?Problem Relation Age of Onset  ? Anxiety disorder Mother   ? Heart disease Father   ?     pacemaker, CAD in his 6s  ? Alzheimer's disease Paternal Grandmother   ? Heart attack Paternal Grandfather   ? ?Social History  ? ?Socioeconomic History  ? Marital status: Divorced  ?  Spouse name: Not on file  ? Number of children: Not on file  ? Years of education: Not on file  ? Highest education level: Not on file  ?Occupational History  ? Not on file  ?Tobacco Use  ? Smoking status: Never  ? Smokeless tobacco: Never  ?Substance  and Sexual Activity  ? Alcohol use: Yes  ?  Alcohol/week: 2.0 standard drinks  ?  Types: 2 Standard drinks or equivalent per week  ?  Comment: daily  ? Drug use: No  ? Sexual activity: Yes  ?  Partners: Female  ?Other Topics Concern  ? Not on file  ?Social History Narrative  ? Work or School: part time work Electronics engineer, on disability for mental health, hx of hospital visit for dehydration and she was then hospitalized for something to do with with psychiatric illness - she feels by mistake  ?   ? Home Situation: lives alone with a dog; has 2 grandkids  ?   ? Spiritual  Beliefs: catholic beliefs, non-practicing  ?   ? Lifestyle: regular exercise; diet is pretty good  ? ?Social Determinants of Health  ? ?Financial Resource Strain: Low Risk   ? Difficulty of Paying Living Expenses: Not hard at all  ?Food Insecurity: No Food Insecurity  ? Worried About Programme researcher, broadcasting/film/video in the Last Year: Never true  ? Ran Out of Food in the Last Year: Never true  ?Transportation Needs: No Transportation Needs  ? Lack of Transportation (Medical): No  ? Lack of Transportation (Non-Medical): No  ?Physical Activity: Inactive  ? Days of Exercise per Week: 0 days  ? Minutes of Exercise per Session: 0 min  ?Stress: No Stress Concern Present  ? Feeling of Stress : Not at all  ?Social Connections: Socially Isolated  ? Frequency of Communication with Friends and Family: More than three times a week  ? Frequency of Social Gatherings with Friends and Family: More than three times a week  ? Attends Religious Services: Never  ? Active Member of Clubs or Organizations: No  ? Attends Banker Meetings: Never  ? Marital Status: Divorced  ? ?Clinical Intake: ? ? ?Diabetic?  No ? ? ? ?Activities of Daily Living ?   ? View : No data to display.  ?  ?  ?  ? ? ?Patient Care Team: ?Wynn Banker, MD as PCP - General (Family Medicine) ?Elaina Pattee, MD as Consulting Physician (Psychiatry) ? ?Indicate any recent Medical Services you may have received from other than [facility] providers in the past year (date may be approximate). ? ?   ?Assessment:  ? This is a routine wellness examination for [facility]. ? ?Hearing/Vision screen ?Hearing Screening - Comments:: No hearing difficulty ?Vision Screening - Comments:: Wears glasses. Followed by Dr Emily Filbert ? ?Dietary issues and exercise activities discussed: ?  ? ? Goals Addressed   ? ?  ?  ?  ?  ?  ? This Visit's Progress  ?   Patient Stated (pt-stated)     ?   I want to be able to walk when My foot gets better. ?  ? ?  ? ?Depression Screen ? ?  03/09/2022  ? 11:13 AM  03/03/2021  ? 11:06 AM 12/31/2018  ?  9:02 AM  ?PHQ 2/9 Scores  ?PHQ - 2 Score 0 0 0  ?PHQ- 9 Score   3  ?  ?Fall Risk ? ?  03/09/2022  ? 11:16 AM 03/03/2021  ? 11:08 AM  ?Fall Risk   ?Falls in the past year? 0   ?Number falls in past yr: 0 0  ?Injury with Fall? 0 0  ?Risk for fall due to : No Fall Risks Impaired vision  ? ? ?FALL RISK PREVENTION PERTAINING TO THE HOME: ? ?Any stairs  in or around the home? Yes  ?If so, are there any without handrails? No  ?Home free of loose throw rugs in walkways, pet beds, electrical cords, etc? Yes  ?Adequate lighting in your home to reduce risk of falls? Yes  ? ?ASSISTIVE DEVICES UTILIZED TO PREVENT FALLS: ? ?Life alert? No  ?Use of a cane, walker or w/c? No  ?Grab bars in the bathroom? No  ?Shower chair or bench in shower? No  ?Elevated toilet seat or a handicapped toilet? No  ? ?TIMED UP AND GO: ? ?Was the test performed? No . Audio Visit ? ? ?Cognitive Function: ?  ?  ? ?  03/09/2022  ? 11:17 AM 03/03/2021  ? 11:10 AM  ?6CIT Screen  ?What Year? 0 points 0 points  ?What month? 0 points 0 points  ?What time? 0 points   ?Count back from 20 0 points 0 points  ?Months in reverse 0 points 0 points  ?Repeat phrase 0 points 2 points  ?Total Score 0 points   ? ? ?Immunizations ?Immunization History  ?Administered Date(s) Administered  ? Influenza Whole 08/28/2010  ? Influenza,inj,Quad PF,6+ Mos 10/11/2018  ? PFIZER(Purple Top)SARS-COV-2 Vaccination 02/29/2020, 03/24/2020  ? Td 12/31/2018  ? ? ?TDAP status: Up to date ? ?Covid-19 vaccine status: Completed vaccines ? ?Qualifies for Shingles Vaccine? Yes   ?Zostavax completed No   ?Shingrix Completed?: No.    Education has been provided regarding the importance of this vaccine. Patient has been advised to call insurance company to determine out of pocket expense if they have not yet received this vaccine. Advised may also receive vaccine at local pharmacy or Health Dept. Verbalized acceptance and understanding. ? ?Screening Tests ?Health  Maintenance  ?Topic Date Due  ? COVID-19 Vaccine (3 - Pfizer risk series) 03/25/2022 (Originally 04/21/2020)  ? Zoster Vaccines- Shingrix (1 of 2) 06/08/2022 (Originally 02/07/1983)  ? MAMMOGRAM  03/10/2023 (Originally 8/1/202

## 2022-03-18 DIAGNOSIS — M25671 Stiffness of right ankle, not elsewhere classified: Secondary | ICD-10-CM | POA: Diagnosis not present

## 2022-03-18 DIAGNOSIS — M6281 Muscle weakness (generalized): Secondary | ICD-10-CM | POA: Diagnosis not present

## 2022-03-18 DIAGNOSIS — M76821 Posterior tibial tendinitis, right leg: Secondary | ICD-10-CM | POA: Diagnosis not present

## 2022-03-21 DIAGNOSIS — M6281 Muscle weakness (generalized): Secondary | ICD-10-CM | POA: Diagnosis not present

## 2022-03-21 DIAGNOSIS — M25671 Stiffness of right ankle, not elsewhere classified: Secondary | ICD-10-CM | POA: Diagnosis not present

## 2022-03-21 DIAGNOSIS — M76821 Posterior tibial tendinitis, right leg: Secondary | ICD-10-CM | POA: Diagnosis not present

## 2022-03-23 DIAGNOSIS — R69 Illness, unspecified: Secondary | ICD-10-CM | POA: Diagnosis not present

## 2022-03-23 DIAGNOSIS — F319 Bipolar disorder, unspecified: Secondary | ICD-10-CM | POA: Diagnosis not present

## 2022-03-24 DIAGNOSIS — M25671 Stiffness of right ankle, not elsewhere classified: Secondary | ICD-10-CM | POA: Diagnosis not present

## 2022-03-24 DIAGNOSIS — M76821 Posterior tibial tendinitis, right leg: Secondary | ICD-10-CM | POA: Diagnosis not present

## 2022-03-24 DIAGNOSIS — M6281 Muscle weakness (generalized): Secondary | ICD-10-CM | POA: Diagnosis not present

## 2022-03-28 DIAGNOSIS — M6281 Muscle weakness (generalized): Secondary | ICD-10-CM | POA: Diagnosis not present

## 2022-03-28 DIAGNOSIS — M25671 Stiffness of right ankle, not elsewhere classified: Secondary | ICD-10-CM | POA: Diagnosis not present

## 2022-03-28 DIAGNOSIS — M76821 Posterior tibial tendinitis, right leg: Secondary | ICD-10-CM | POA: Diagnosis not present

## 2022-03-31 DIAGNOSIS — M76821 Posterior tibial tendinitis, right leg: Secondary | ICD-10-CM | POA: Diagnosis not present

## 2022-03-31 DIAGNOSIS — M25671 Stiffness of right ankle, not elsewhere classified: Secondary | ICD-10-CM | POA: Diagnosis not present

## 2022-03-31 DIAGNOSIS — M6281 Muscle weakness (generalized): Secondary | ICD-10-CM | POA: Diagnosis not present

## 2022-04-01 DIAGNOSIS — M76821 Posterior tibial tendinitis, right leg: Secondary | ICD-10-CM | POA: Diagnosis not present

## 2022-04-01 DIAGNOSIS — M6281 Muscle weakness (generalized): Secondary | ICD-10-CM | POA: Diagnosis not present

## 2022-04-01 DIAGNOSIS — M25671 Stiffness of right ankle, not elsewhere classified: Secondary | ICD-10-CM | POA: Diagnosis not present

## 2022-04-04 DIAGNOSIS — M25571 Pain in right ankle and joints of right foot: Secondary | ICD-10-CM | POA: Diagnosis not present

## 2022-04-04 DIAGNOSIS — M76821 Posterior tibial tendinitis, right leg: Secondary | ICD-10-CM | POA: Diagnosis not present

## 2022-04-04 DIAGNOSIS — M6281 Muscle weakness (generalized): Secondary | ICD-10-CM | POA: Diagnosis not present

## 2022-04-04 DIAGNOSIS — M25671 Stiffness of right ankle, not elsewhere classified: Secondary | ICD-10-CM | POA: Diagnosis not present

## 2022-04-06 ENCOUNTER — Encounter: Payer: Self-pay | Admitting: Family Medicine

## 2022-04-06 ENCOUNTER — Ambulatory Visit (INDEPENDENT_AMBULATORY_CARE_PROVIDER_SITE_OTHER): Payer: Medicare HMO | Admitting: Family Medicine

## 2022-04-06 VITALS — BP 98/60 | HR 80 | Temp 98.0°F

## 2022-04-06 DIAGNOSIS — R7301 Impaired fasting glucose: Secondary | ICD-10-CM | POA: Diagnosis not present

## 2022-04-06 DIAGNOSIS — Z13220 Encounter for screening for lipoid disorders: Secondary | ICD-10-CM | POA: Diagnosis not present

## 2022-04-06 DIAGNOSIS — F4310 Post-traumatic stress disorder, unspecified: Secondary | ICD-10-CM

## 2022-04-06 DIAGNOSIS — F319 Bipolar disorder, unspecified: Secondary | ICD-10-CM

## 2022-04-06 DIAGNOSIS — R5383 Other fatigue: Secondary | ICD-10-CM | POA: Diagnosis not present

## 2022-04-06 DIAGNOSIS — Z0000 Encounter for general adult medical examination without abnormal findings: Secondary | ICD-10-CM | POA: Diagnosis not present

## 2022-04-06 DIAGNOSIS — E559 Vitamin D deficiency, unspecified: Secondary | ICD-10-CM | POA: Diagnosis not present

## 2022-04-06 DIAGNOSIS — R69 Illness, unspecified: Secondary | ICD-10-CM | POA: Diagnosis not present

## 2022-04-06 LAB — COMPREHENSIVE METABOLIC PANEL
ALT: 15 U/L (ref 0–35)
AST: 16 U/L (ref 0–37)
Albumin: 4.7 g/dL (ref 3.5–5.2)
Alkaline Phosphatase: 62 U/L (ref 39–117)
BUN: 14 mg/dL (ref 6–23)
CO2: 28 mEq/L (ref 19–32)
Calcium: 9.7 mg/dL (ref 8.4–10.5)
Chloride: 103 mEq/L (ref 96–112)
Creatinine, Ser: 0.83 mg/dL (ref 0.40–1.20)
GFR: 77.85 mL/min (ref >60.00)
Glucose, Bld: 101 mg/dL — ABNORMAL HIGH (ref 70–99)
Potassium: 4.3 mEq/L (ref 3.5–5.1)
Sodium: 138 mEq/L (ref 135–145)
Total Bilirubin: 0.7 mg/dL (ref 0.2–1.2)
Total Protein: 7.4 g/dL (ref 6.0–8.3)

## 2022-04-06 LAB — CBC WITH DIFFERENTIAL/PLATELET
Basophils Absolute: 0 10*3/uL (ref 0.0–0.1)
Basophils Relative: 0.7 % (ref 0.0–3.0)
Eosinophils Absolute: 0.2 10*3/uL (ref 0.0–0.7)
Eosinophils Relative: 4.6 % (ref 0.0–5.0)
HCT: 43.7 % (ref 36.0–46.0)
Hemoglobin: 14.3 g/dL (ref 12.0–15.0)
Lymphocytes Relative: 33.9 % (ref 12.0–46.0)
Lymphs Abs: 1.7 10*3/uL (ref 0.7–4.0)
MCHC: 32.8 g/dL (ref 30.0–36.0)
MCV: 92.8 fl (ref 78.0–100.0)
Monocytes Absolute: 0.5 10*3/uL (ref 0.1–1.0)
Monocytes Relative: 10.3 % (ref 3.0–12.0)
Neutro Abs: 2.5 10*3/uL (ref 1.4–7.7)
Neutrophils Relative %: 50.5 % (ref 43.0–77.0)
Platelets: 254 10*3/uL (ref 150.0–400.0)
RBC: 4.71 Mil/uL (ref 3.87–5.11)
RDW: 14.2 % (ref 11.5–15.5)
WBC: 4.9 10*3/uL (ref 4.0–10.5)

## 2022-04-06 LAB — VITAMIN B12: Vitamin B-12: 193 pg/mL — ABNORMAL LOW (ref 211–911)

## 2022-04-06 LAB — HEMOGLOBIN A1C: Hgb A1c MFr Bld: 5.6 % (ref 4.6–6.5)

## 2022-04-06 LAB — LIPID PANEL
Cholesterol: 275 mg/dL — ABNORMAL HIGH (ref 0–200)
HDL: 78.1 mg/dL (ref >39.00)
LDL Cholesterol: 174 mg/dL — ABNORMAL HIGH (ref 0–99)
NonHDL: 196.59
Total CHOL/HDL Ratio: 4
Triglycerides: 113 mg/dL (ref 0.0–149.0)
VLDL: 22.6 mg/dL (ref 0.0–40.0)

## 2022-04-06 LAB — VITAMIN D 25 HYDROXY (VIT D DEFICIENCY, FRACTURES): VITD: 21.79 ng/mL — ABNORMAL LOW (ref 30.00–100.00)

## 2022-04-06 MED ORDER — BUPROPION HCL ER (XL) 150 MG PO TB24: 150.0000 mg | ORAL_TABLET | Freq: Every day | ORAL | 1 refills | Status: DC

## 2022-04-06 MED ORDER — TRAZODONE HCL 50 MG PO TABS: 75.0000 mg | ORAL_TABLET | Freq: Every evening | ORAL | 1 refills | Status: DC | PRN

## 2022-04-06 MED ORDER — LAMOTRIGINE 25 MG PO TABS: 50.0000 mg | ORAL_TABLET | Freq: Every day | ORAL | 1 refills | Status: DC

## 2022-04-06 NOTE — Progress Notes (Signed)
[name] ?DOB: 02/15/1966 ?Encounter date: 04/06/2022 ? ?This is a 56 y.o. female who presents for complete physical  ? ?History of present illness/Additional concerns: ?She ended up having surgery on heel, foot to move tendon and remove extra bone. Surgery was in February. She is still non-weight bearing. Swelling is improving. She is on knee scooter right now. Pins and needles all the time in this right foot.she is doing PT now. She had gotten to point that she couldn't walk by the end of the day, so felt that she needed to have surgery.  ? ?Daughter took kids back to her care (she was primary caregiver for grandchildren until this surgery).  ? ?Bipolar/PTSD/panic disorder: Follows with psychiatry ?Follows with gynecology for routine mammograms and Pap smears. ?Follows with functional medicine doctor on a regular basis. ?Last mammogram?  In our system but appears 07/2020. ?Cologuard - 10/2020 ? ?Past Medical History:  ?Diagnosis Date  ? ALLERGIC RHINITIS 01/10/2008  ? ANXIETY 01/10/2008  ? ATTENTION DEFICIT HYPERACTIVITY DISORDER, ADULT 10/28/2009  ? BIPOLAR DISORDER UNSPECIFIED 01/10/2008  ? CHEST PAIN, ATYPICAL 01/10/2008  ? DEPRESSION 01/10/2008  ? INSOMNIA 09/17/2010  ? PANIC DISORDER 01/10/2008  ? PHLEBITIS\T\THROMBOPHLEB SUP VESSELS LOWER EXTREM 07/05/2010  ? PLANTAR WART, LEFT 12/29/2008  ? Sebaceous cyst   ? scalp, seeing general surgeon  ? ?Past Surgical History:  ?Procedure Laterality Date  ? inguinal hernia repair Right   ? 2009  ? knee surgery    ? patella fx, [facility]  ? TONSILLECTOMY    ? ?No Known Allergies ?Current Meds  ?Medication Sig  ? buPROPion (WELLBUTRIN XL) 150 MG 24 hr tablet Take 1 tablet (150 mg total) by mouth daily.  ? clonazePAM (KLONOPIN) 0.5 MG tablet TAKE ONE TABLET (0.5 MG DOSE) BY MOUTH DAILY AS NEEDED FOR ANXIETY. 90 DAY SUPPLY  ? gabapentin (NEURONTIN) 300 MG capsule Take 300 mg by mouth at bedtime as needed.  ? lamoTRIgine (LAMICTAL) 25 MG tablet Take 2 tablets (50 mg total) by  mouth daily.  ? meloxicam (MOBIC) 15 MG tablet Take 1 tablet (15 mg total) by mouth daily as needed for pain (ankle pain).  ? traZODone (DESYREL) 50 MG tablet Take 1.5 tablets (75 mg total) by mouth at bedtime as needed for sleep.  ? [DISCONTINUED] buPROPion HCl (WELLBUTRIN PO) Take 150 mg by mouth daily at 12 noon.  ? [DISCONTINUED] lamoTRIgine (LAMICTAL PO) Take 50 mg by mouth daily.  ? [DISCONTINUED] traZODone (DESYREL) 25 mg TABS tablet Take 12.5 mg by mouth at bedtime.   ? ?Social History  ? ?Tobacco Use  ? Smoking status: Never  ? Smokeless tobacco: Never  ?Substance Use Topics  ? Alcohol use: Yes  ?  Alcohol/week: 2.0 standard drinks  ?  Types: 2 Standard drinks or equivalent per week  ?  Comment: daily  ? ?Family History  ?Problem Relation Age of Onset  ? Anxiety disorder Mother   ? Heart disease Father   ?     pacemaker, CAD in his 78s  ? Alzheimer's disease Paternal Grandmother   ? Heart attack Paternal Grandfather   ? ? ? ?Review of Systems  ?Constitutional:  Negative for activity change, appetite change, chills, fatigue, fever and unexpected weight change.  ?HENT:  Negative for congestion, ear pain, hearing loss, sinus pressure, sinus pain, sore throat and trouble swallowing.   ?Eyes:  Negative for pain and visual disturbance.  ?Respiratory:  Negative for cough, chest tightness, shortness of breath and wheezing.   ?  Cardiovascular:  Negative for chest pain, palpitations and leg swelling.  ?Gastrointestinal:  Negative for abdominal pain, blood in stool, constipation, diarrhea, nausea and vomiting.  ?Genitourinary:  Negative for difficulty urinating and menstrual problem.  ?Musculoskeletal:  Negative for arthralgias (right foot pain) and back pain.  ?Skin:  Negative for rash.  ?Neurological:  Negative for dizziness, weakness, numbness and headaches.  ?Hematological:  Negative for adenopathy. Does not bruise/bleed easily.  ?Psychiatric/Behavioral:  Negative for sleep disturbance and suicidal ideas. The  patient is not nervous/anxious.   ? ?CBC:  ?Lab Results  ?Component Value Date  ? WBC 4.9 04/06/2022  ? HGB 14.3 04/06/2022  ? HCT 43.7 04/06/2022  ? MCH 30.8 08/28/2020  ? MCHC 32.8 04/06/2022  ? RDW 14.2 04/06/2022  ? PLT 254.0 04/06/2022  ? MPV 10.6 08/28/2020  ? ?CMP: ?Lab Results  ?Component Value Date  ? NA 138 04/06/2022  ? K 4.3 04/06/2022  ? CL 103 04/06/2022  ? CO2 28 04/06/2022  ? GLUCOSE 101 (H) 04/06/2022  ? BUN 14 04/06/2022  ? CREATININE 0.83 04/06/2022  ? CREATININE 0.84 08/28/2020  ? GFRAA >90 10/23/2011  ? CALCIUM 9.7 04/06/2022  ? PROT 7.4 04/06/2022  ? BILITOT 0.7 04/06/2022  ? ALKPHOS 62 04/06/2022  ? ALT 15 04/06/2022  ? AST 16 04/06/2022  ? ?LIPID: ?Lab Results  ?Component Value Date  ? CHOL 275 (H) 04/06/2022  ? TRIG 113.0 04/06/2022  ? HDL 78.10 04/06/2022  ? LDLCALC 174 (H) 04/06/2022  ? LDLCALC 159 (H) 08/28/2020  ? ? ?Objective: ? ?BP 98/60 (BP Location: Right Arm, Patient Position: Sitting, Cuff Size: Large)   Pulse 80   Temp 98 ?F (36.7 ?C) (Oral)   LMP 09/16/2016 (Exact Date)   SpO2 97%      ? ?BP Readings from Last 3 Encounters:  ?04/06/22 98/60  ?07/08/21 122/86  ?06/09/21 110/78  ? ?Wt Readings from Last 3 Encounters:  ?03/09/22 200 lb (90.7 kg)  ?07/08/21 200 lb (90.7 kg)  ?06/09/21 209 lb (94.8 kg)  ? ? ?Physical Exam ?Constitutional:   ?   General: She is not in acute distress. ?   Appearance: She is well-developed.  ?HENT:  ?   Head: Normocephalic and atraumatic.  ?   Right Ear: External ear normal.  ?   Left Ear: External ear normal.  ?   Mouth/Throat:  ?   Pharynx: No oropharyngeal exudate.  ?Eyes:  ?   Conjunctiva/sclera: Conjunctivae normal.  ?   Pupils: Pupils are equal, round, and reactive to light.  ?Neck:  ?   Thyroid: No thyromegaly.  ?Cardiovascular:  ?   Rate and Rhythm: Normal rate and regular rhythm.  ?   Heart sounds: Normal heart sounds. No murmur heard. ?  No friction rub. No gallop.  ?Pulmonary:  ?   Effort: Pulmonary effort is normal.  ?   Breath sounds:  Normal breath sounds.  ?Abdominal:  ?   General: Bowel sounds are normal. There is no distension.  ?   Palpations: Abdomen is soft. There is no mass.  ?   Tenderness: There is no abdominal tenderness. There is no guarding.  ?   Hernia: No hernia is present.  ?Musculoskeletal:     ?   General: No tenderness or deformity. Normal range of motion.  ?   Cervical back: Normal range of motion and neck supple.  ?Lymphadenopathy:  ?   Cervical: No cervical adenopathy.  ?Skin: ?   General: Skin is warm and  dry.  ?   Findings: No rash.  ?Neurological:  ?   Mental Status: She is alert and oriented to person, place, and time.  ?   Deep Tendon Reflexes: Reflexes normal.  ?   Reflex Scores: ?     Tricep reflexes are 2+ on the right side and 2+ on the left side. ?     Bicep reflexes are 2+ on the right side and 2+ on the left side. ?     Brachioradialis reflexes are 2+ on the right side and 2+ on the left side. ?     Patellar reflexes are 2+ on the right side and 2+ on the left side. ?Psychiatric:     ?   Speech: Speech normal.     ?   Behavior: Behavior normal.     ?   Thought Content: Thought content normal.  ? ? ?Assessment/Plan: ?There are no preventive care reminders to display for this patient. ? ?Health Maintenance reviewed. ? ?1. Preventative health care ?She will be getting back to regular exercises soon as she is allowed to be weightbearing again.  Continue working on healthy eating. ? ?2. Bipolar affective disorder, remission status unspecified (HCC) ?Mood has been stable.  Medications refilled today as she has been stable on these medications for years. ?- lamoTRIgine (LAMICTAL) 25 MG tablet; Take 2 tablets (50 mg total) by mouth daily.  Dispense: 180 tablet; Refill: 1 ?- buPROPion (WELLBUTRIN XL) 150 MG 24 hr tablet; Take 1 tablet (150 mg total) by mouth daily.  Dispense: 90 tablet; Refill: 1 ? ?3. Vitamin D deficiency ?- VITAMIN D 25 Hydroxy (Vit-D Deficiency, Fractures); Future ?- VITAMIN D 25 Hydroxy (Vit-D  Deficiency, Fractures) ? ?4. PTSD (post-traumatic stress disorder) ?Stable on medications, see above. ? ?5. Fatigue, unspecified type ?- Comprehensive metabolic panel; Future ?- CBC with Differential/Platelet; Future ?-

## 2022-04-07 DIAGNOSIS — M25671 Stiffness of right ankle, not elsewhere classified: Secondary | ICD-10-CM | POA: Diagnosis not present

## 2022-04-07 DIAGNOSIS — M76821 Posterior tibial tendinitis, right leg: Secondary | ICD-10-CM | POA: Diagnosis not present

## 2022-04-07 DIAGNOSIS — M6281 Muscle weakness (generalized): Secondary | ICD-10-CM | POA: Diagnosis not present

## 2022-04-11 DIAGNOSIS — M25671 Stiffness of right ankle, not elsewhere classified: Secondary | ICD-10-CM | POA: Diagnosis not present

## 2022-04-11 DIAGNOSIS — M76821 Posterior tibial tendinitis, right leg: Secondary | ICD-10-CM | POA: Diagnosis not present

## 2022-04-11 DIAGNOSIS — M6281 Muscle weakness (generalized): Secondary | ICD-10-CM | POA: Diagnosis not present

## 2022-04-12 DIAGNOSIS — M25671 Stiffness of right ankle, not elsewhere classified: Secondary | ICD-10-CM | POA: Diagnosis not present

## 2022-04-12 DIAGNOSIS — M76821 Posterior tibial tendinitis, right leg: Secondary | ICD-10-CM | POA: Diagnosis not present

## 2022-04-12 DIAGNOSIS — M6281 Muscle weakness (generalized): Secondary | ICD-10-CM | POA: Diagnosis not present

## 2022-04-18 DIAGNOSIS — M6281 Muscle weakness (generalized): Secondary | ICD-10-CM | POA: Diagnosis not present

## 2022-04-18 DIAGNOSIS — M25531 Pain in right wrist: Secondary | ICD-10-CM | POA: Diagnosis not present

## 2022-04-18 DIAGNOSIS — M76821 Posterior tibial tendinitis, right leg: Secondary | ICD-10-CM | POA: Diagnosis not present

## 2022-04-18 DIAGNOSIS — M25671 Stiffness of right ankle, not elsewhere classified: Secondary | ICD-10-CM | POA: Diagnosis not present

## 2022-04-26 DIAGNOSIS — M25671 Stiffness of right ankle, not elsewhere classified: Secondary | ICD-10-CM | POA: Diagnosis not present

## 2022-04-26 DIAGNOSIS — M6281 Muscle weakness (generalized): Secondary | ICD-10-CM | POA: Diagnosis not present

## 2022-04-26 DIAGNOSIS — M76821 Posterior tibial tendinitis, right leg: Secondary | ICD-10-CM | POA: Diagnosis not present

## 2022-04-27 DIAGNOSIS — R69 Illness, unspecified: Secondary | ICD-10-CM | POA: Diagnosis not present

## 2022-04-27 DIAGNOSIS — F319 Bipolar disorder, unspecified: Secondary | ICD-10-CM | POA: Diagnosis not present

## 2022-07-29 ENCOUNTER — Ambulatory Visit (INDEPENDENT_AMBULATORY_CARE_PROVIDER_SITE_OTHER): Payer: PPO | Admitting: Family Medicine

## 2022-07-29 ENCOUNTER — Encounter: Payer: Self-pay | Admitting: Family Medicine

## 2022-07-29 VITALS — BP 98/62 | HR 78 | Temp 98.0°F | Ht 67.0 in | Wt 216.8 lb

## 2022-07-29 DIAGNOSIS — E559 Vitamin D deficiency, unspecified: Secondary | ICD-10-CM

## 2022-07-29 DIAGNOSIS — R7301 Impaired fasting glucose: Secondary | ICD-10-CM | POA: Diagnosis not present

## 2022-07-29 DIAGNOSIS — E538 Deficiency of other specified B group vitamins: Secondary | ICD-10-CM

## 2022-07-29 LAB — POCT GLYCOSYLATED HEMOGLOBIN (HGB A1C): Hemoglobin A1C: 5.4 % (ref 4.0–5.6)

## 2022-07-29 NOTE — Patient Instructions (Addendum)
Start taking Vitamin D and B12 supplements.  At your convenience you may schedule your Shingrix vaccination series at any area pharmacy.

## 2022-07-29 NOTE — Progress Notes (Signed)
Established Patient Office Visit  Subjective   Patient ID: [name], female    DOB: 1966/09/14  Age: 56 y.o. MRN: [id number]  Chief Complaint  Patient presents with  . Establish Care    Patient is here for transition of care visit. Patient reports that she had foot surgery in February and she is still recovering from that, states she is attending physical therapy regularly to help rehab her foot.   Bipolar d/o - patient was seeing a psychiatrist regularly, states that the doctor retired and she reports her symptoms are stable. We reviewed her medications and she states she only takes the klonopin as needed, states that is usually once or twice a month. States she still has plenty of this medication at home.   Health maintenance - pt reports she sees [facility] and they perform her pap smears and mammograms, she is due for both of these and she states she will schedule a visit with them.  We reviewed her bloodwork and she is low on both her vitamin D and B12. We discussed supplementation and she states she sees a functional medicine doctor and she will ask him about supplementation.  Current Outpatient Medications  Medication Instructions  . buPROPion (WELLBUTRIN XL) 150 mg, Oral, Daily  . clonazePAM (KLONOPIN) 0.5 MG tablet TAKE ONE TABLET (0.5 MG DOSE) BY MOUTH DAILY AS NEEDED FOR ANXIETY. 90 DAY SUPPLY  . lamoTRIgine (LAMICTAL) 50 mg, Oral, Daily  . traZODone (DESYREL) 75 mg, Oral, At bedtime PRN     {History (Optional):23778}  Review of Systems  All other systems reviewed and are negative.     Objective:     BP 98/62 (BP Location: Right Arm, Patient Position: Sitting, Cuff Size: Large)   Pulse 78   Temp 98 F (36.7 C) (Oral)   Ht 5\' 7"  (1.702 m)   Wt 216 lb 12.8 oz (98.3 kg)   LMP 11/29/2015 (Approximate)   SpO2 98%   BMI 33.96 kg/m  {Vitals History (Optional):23777}  Physical Exam Vitals reviewed.  Constitutional:      Appearance: Normal  appearance. She is well-groomed and normal weight.  HENT:     Head: Normocephalic and atraumatic.     Mouth/Throat:     Mouth: Mucous membranes are moist.     Pharynx: Oropharynx is clear.  Eyes:     Extraocular Movements: Extraocular movements intact.     Conjunctiva/sclera: Conjunctivae normal.     Pupils: Pupils are equal, round, and reactive to light.  Cardiovascular:     Rate and Rhythm: Normal rate and regular rhythm.     Pulses: Normal pulses.     Heart sounds: S1 normal and S2 normal.  Pulmonary:     Effort: Pulmonary effort is normal.     Breath sounds: Normal breath sounds and air entry.  Abdominal:     General: Abdomen is flat. Bowel sounds are normal.     Palpations: Abdomen is soft.  Musculoskeletal:        General: Normal range of motion.     Cervical back: Normal range of motion and neck supple.     Right lower leg: No edema.     Left lower leg: No edema.  Skin:    General: Skin is warm and dry.  Neurological:     Mental Status: She is alert and oriented to person, place, and time. Mental status is at baseline.     Gait: Gait is intact.  Psychiatric:  Mood and Affect: Mood and affect normal.        Speech: Speech normal.        Behavior: Behavior normal.        Judgment: Judgment normal.     Results for orders placed or performed in visit on 07/29/22  POC HgB A1c  Result Value Ref Range   Hemoglobin A1C 5.4 4.0 - 5.6 %   HbA1c POC (<> result, manual entry)     HbA1c, POC (prediabetic range)     HbA1c, POC (controlled diabetic range)      {Labs (Optional):23779}  The 10-year ASCVD risk score (Arnett DK, et al., 2019) is: 1.6%    Assessment & Plan:   Problem List Items Addressed This Visit       Other   Vitamin D deficiency   B12 deficiency   Other Visit Diagnoses     Impaired fasting glucose    -  Primary   Relevant Orders   POC HgB A1c (Completed)       No follow-ups on file.    Karie Georges, MD

## 2022-08-02 NOTE — Assessment & Plan Note (Signed)
Checked in May 2023 and it was low, I advised she start taking at least 2000 units daily of OTC vitamin D supplements daily. I will recheck this at her annual visit.

## 2022-08-02 NOTE — Assessment & Plan Note (Signed)
Last level was in May 2023 and it was below normal, I advised she start OTC oral supplements daily. I will recheck these at her next visit.

## 2022-10-03 ENCOUNTER — Other Ambulatory Visit: Payer: Self-pay | Admitting: *Deleted

## 2022-10-03 DIAGNOSIS — F319 Bipolar disorder, unspecified: Secondary | ICD-10-CM

## 2022-10-03 MED ORDER — TRAZODONE HCL 50 MG PO TABS: 75.0000 mg | ORAL_TABLET | Freq: Every evening | ORAL | 1 refills | Status: DC | PRN

## 2022-10-03 MED ORDER — BUPROPION HCL ER (XL) 150 MG PO TB24: 150.0000 mg | ORAL_TABLET | Freq: Every day | ORAL | 1 refills | Status: DC

## 2022-10-14 ENCOUNTER — Other Ambulatory Visit: Payer: Self-pay | Admitting: *Deleted

## 2022-10-14 DIAGNOSIS — F319 Bipolar disorder, unspecified: Secondary | ICD-10-CM

## 2022-10-14 MED ORDER — LAMOTRIGINE 25 MG PO TABS: 50.0000 mg | ORAL_TABLET | Freq: Every day | ORAL | 1 refills | Status: DC

## 2022-11-30 DIAGNOSIS — R260 Ataxic gait: Secondary | ICD-10-CM | POA: Diagnosis not present

## 2022-11-30 DIAGNOSIS — M25571 Pain in right ankle and joints of right foot: Secondary | ICD-10-CM | POA: Diagnosis not present

## 2022-12-02 DIAGNOSIS — M25571 Pain in right ankle and joints of right foot: Secondary | ICD-10-CM | POA: Diagnosis not present

## 2022-12-02 DIAGNOSIS — R260 Ataxic gait: Secondary | ICD-10-CM | POA: Diagnosis not present

## 2022-12-07 DIAGNOSIS — R260 Ataxic gait: Secondary | ICD-10-CM | POA: Diagnosis not present

## 2022-12-07 DIAGNOSIS — M25571 Pain in right ankle and joints of right foot: Secondary | ICD-10-CM | POA: Diagnosis not present

## 2022-12-08 DIAGNOSIS — F319 Bipolar disorder, unspecified: Secondary | ICD-10-CM | POA: Diagnosis not present

## 2022-12-09 DIAGNOSIS — M25571 Pain in right ankle and joints of right foot: Secondary | ICD-10-CM | POA: Diagnosis not present

## 2022-12-09 DIAGNOSIS — R260 Ataxic gait: Secondary | ICD-10-CM | POA: Diagnosis not present

## 2022-12-13 DIAGNOSIS — M25571 Pain in right ankle and joints of right foot: Secondary | ICD-10-CM | POA: Diagnosis not present

## 2022-12-13 DIAGNOSIS — R260 Ataxic gait: Secondary | ICD-10-CM | POA: Diagnosis not present

## 2022-12-15 DIAGNOSIS — M25571 Pain in right ankle and joints of right foot: Secondary | ICD-10-CM | POA: Diagnosis not present

## 2022-12-15 DIAGNOSIS — R260 Ataxic gait: Secondary | ICD-10-CM | POA: Diagnosis not present

## 2022-12-16 DIAGNOSIS — M25571 Pain in right ankle and joints of right foot: Secondary | ICD-10-CM | POA: Diagnosis not present

## 2022-12-16 DIAGNOSIS — R260 Ataxic gait: Secondary | ICD-10-CM | POA: Diagnosis not present

## 2022-12-20 DIAGNOSIS — R260 Ataxic gait: Secondary | ICD-10-CM | POA: Diagnosis not present

## 2022-12-20 DIAGNOSIS — M25571 Pain in right ankle and joints of right foot: Secondary | ICD-10-CM | POA: Diagnosis not present

## 2022-12-21 DIAGNOSIS — M25571 Pain in right ankle and joints of right foot: Secondary | ICD-10-CM | POA: Diagnosis not present

## 2022-12-21 DIAGNOSIS — R260 Ataxic gait: Secondary | ICD-10-CM | POA: Diagnosis not present

## 2022-12-22 DIAGNOSIS — F319 Bipolar disorder, unspecified: Secondary | ICD-10-CM | POA: Diagnosis not present

## 2022-12-23 DIAGNOSIS — R260 Ataxic gait: Secondary | ICD-10-CM | POA: Diagnosis not present

## 2022-12-23 DIAGNOSIS — M25571 Pain in right ankle and joints of right foot: Secondary | ICD-10-CM | POA: Diagnosis not present

## 2022-12-27 DIAGNOSIS — M25571 Pain in right ankle and joints of right foot: Secondary | ICD-10-CM | POA: Diagnosis not present

## 2022-12-27 DIAGNOSIS — R260 Ataxic gait: Secondary | ICD-10-CM | POA: Diagnosis not present

## 2022-12-28 DIAGNOSIS — R260 Ataxic gait: Secondary | ICD-10-CM | POA: Diagnosis not present

## 2022-12-28 DIAGNOSIS — M25671 Stiffness of right ankle, not elsewhere classified: Secondary | ICD-10-CM | POA: Diagnosis not present

## 2022-12-28 DIAGNOSIS — M25571 Pain in right ankle and joints of right foot: Secondary | ICD-10-CM | POA: Diagnosis not present

## 2022-12-29 ENCOUNTER — Ambulatory Visit (INDEPENDENT_AMBULATORY_CARE_PROVIDER_SITE_OTHER): Payer: PPO | Admitting: Family Medicine

## 2022-12-29 ENCOUNTER — Encounter: Payer: Self-pay | Admitting: Family Medicine

## 2022-12-29 VITALS — BP 118/72 | HR 84 | Temp 98.1°F | Ht 67.0 in | Wt 215.2 lb

## 2022-12-29 DIAGNOSIS — M25511 Pain in right shoulder: Secondary | ICD-10-CM

## 2022-12-29 NOTE — Progress Notes (Signed)
Established Patient Office Visit  Subjective   Patient ID: [name], female    DOB: 07-27-1966  Age: 57 y.o. MRN: [id number]  Chief Complaint  Patient presents with   patient complains of right clavicle pain x1 yr    Pt is reporting new symptom of pain on the right side of her chest underneath her collar bone. States that it feels worse when she sleeps on her right shoulder. States that it has gotten a bit better after she changed her pillow. Pt reports it comes and goes, but recently she has noticed that the head of the bone has bigger on the right side and there is an assymetry.  She reports that it hurts worse if she raises her arm straight up, like when she is lifting weights. States that she is starting an exercise program and wants to make sure that there isn't anything wrong.   Pt also reports that she would like to start weaning off th wellbutrin. Pt reports she is feeling well, no mood symptoms currently and she wants to try stopping the wellbutrin. We discussed how to do this safely.    Current Outpatient Medications  Medication Instructions   buPROPion (WELLBUTRIN XL) 150 mg, Oral, Daily   clonazePAM (KLONOPIN) 0.5 MG tablet TAKE ONE TABLET (0.5 MG DOSE) BY MOUTH DAILY AS NEEDED FOR ANXIETY. 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     Objective:     BP 118/72 (BP Location: Right Arm, Patient Position: Sitting, Cuff Size: Large)   Pulse 84   Temp 98.1 F (36.7 C) (Oral)   Ht  5\' 7"  (1.702 m)   Wt 215 lb 3.2 oz (97.6 kg)   LMP 11/29/2015 (Approximate)   SpO2 98%   BMI 33.71 kg/m    Physical Exam Vitals reviewed.  Constitutional:      Appearance: Normal appearance. She is obese.  Musculoskeletal:        General: Normal range of motion.     Right shoulder: No tenderness or bony tenderness. Normal strength.     Cervical back: Normal range of motion.     Comments: Some tenderness to palpation of the chest wall under the clavicle on the right, there is also assymetry of the clavicles, right clavicle head is more prominent than the left  Neurological:     Mental Status: She is alert.      No results found for any visits on 12/29/22.    The 10-year ASCVD risk score (Arnett DK, et al., 2019) is: 2.3%    Assessment & Plan:   Problem List Items Addressed This Visit   None Visit Diagnoses     Right shoulder pain, unspecified chronicity    -  Primary   Relevant Orders   DG Shoulder Right   DG Clavicle Right   Ambulatory referral to Physical Therapy     Will send patient for x-rays of the shoulder and clavicle. Pt is requesting referral to physical therapy  so she can have her shoulder treated by her PT. Order placed.   No follow-ups on file.    Farrel Conners, MD

## 2022-12-29 NOTE — Patient Instructions (Signed)
[facility] hours: M-F 7:30- 5 pm

## 2022-12-30 ENCOUNTER — Ambulatory Visit (HOSPITAL_BASED_OUTPATIENT_CLINIC_OR_DEPARTMENT_OTHER)
Admission: RE | Admit: 2022-12-30 | Discharge: 2022-12-30 | Disposition: A | Discharge status: Home / Self Care | Payer: PPO | Source: Ambulatory Visit | Attending: Family Medicine | Admitting: Family Medicine

## 2022-12-30 DIAGNOSIS — R260 Ataxic gait: Secondary | ICD-10-CM | POA: Diagnosis not present

## 2022-12-30 DIAGNOSIS — M25511 Pain in right shoulder: Secondary | ICD-10-CM | POA: Diagnosis not present

## 2022-12-30 DIAGNOSIS — M25571 Pain in right ankle and joints of right foot: Secondary | ICD-10-CM | POA: Diagnosis not present

## 2023-01-02 DIAGNOSIS — M531 Cervicobrachial syndrome: Secondary | ICD-10-CM | POA: Diagnosis not present

## 2023-01-02 DIAGNOSIS — M25611 Stiffness of right shoulder, not elsewhere classified: Secondary | ICD-10-CM | POA: Diagnosis not present

## 2023-01-02 DIAGNOSIS — M25511 Pain in right shoulder: Secondary | ICD-10-CM | POA: Diagnosis not present

## 2023-01-04 DIAGNOSIS — M25571 Pain in right ankle and joints of right foot: Secondary | ICD-10-CM | POA: Diagnosis not present

## 2023-01-04 DIAGNOSIS — R260 Ataxic gait: Secondary | ICD-10-CM | POA: Diagnosis not present

## 2023-01-06 DIAGNOSIS — R260 Ataxic gait: Secondary | ICD-10-CM | POA: Diagnosis not present

## 2023-01-06 DIAGNOSIS — M25571 Pain in right ankle and joints of right foot: Secondary | ICD-10-CM | POA: Diagnosis not present

## 2023-01-09 DIAGNOSIS — M25571 Pain in right ankle and joints of right foot: Secondary | ICD-10-CM | POA: Diagnosis not present

## 2023-01-09 DIAGNOSIS — R260 Ataxic gait: Secondary | ICD-10-CM | POA: Diagnosis not present

## 2023-01-11 DIAGNOSIS — R260 Ataxic gait: Secondary | ICD-10-CM | POA: Diagnosis not present

## 2023-01-11 DIAGNOSIS — M25571 Pain in right ankle and joints of right foot: Secondary | ICD-10-CM | POA: Diagnosis not present

## 2023-01-12 DIAGNOSIS — Z1231 Encounter for screening mammogram for malignant neoplasm of breast: Secondary | ICD-10-CM | POA: Diagnosis not present

## 2023-01-12 LAB — HM MAMMOGRAPHY

## 2023-01-13 DIAGNOSIS — R260 Ataxic gait: Secondary | ICD-10-CM | POA: Diagnosis not present

## 2023-01-13 DIAGNOSIS — M25571 Pain in right ankle and joints of right foot: Secondary | ICD-10-CM | POA: Diagnosis not present

## 2023-01-17 DIAGNOSIS — M25571 Pain in right ankle and joints of right foot: Secondary | ICD-10-CM | POA: Diagnosis not present

## 2023-01-17 DIAGNOSIS — R260 Ataxic gait: Secondary | ICD-10-CM | POA: Diagnosis not present

## 2023-01-18 DIAGNOSIS — M25571 Pain in right ankle and joints of right foot: Secondary | ICD-10-CM | POA: Diagnosis not present

## 2023-01-18 DIAGNOSIS — R260 Ataxic gait: Secondary | ICD-10-CM | POA: Diagnosis not present

## 2023-01-20 DIAGNOSIS — R260 Ataxic gait: Secondary | ICD-10-CM | POA: Diagnosis not present

## 2023-01-20 DIAGNOSIS — M25571 Pain in right ankle and joints of right foot: Secondary | ICD-10-CM | POA: Diagnosis not present

## 2023-01-23 DIAGNOSIS — R260 Ataxic gait: Secondary | ICD-10-CM | POA: Diagnosis not present

## 2023-01-23 DIAGNOSIS — M25571 Pain in right ankle and joints of right foot: Secondary | ICD-10-CM | POA: Diagnosis not present

## 2023-01-25 DIAGNOSIS — R260 Ataxic gait: Secondary | ICD-10-CM | POA: Diagnosis not present

## 2023-01-25 DIAGNOSIS — M25571 Pain in right ankle and joints of right foot: Secondary | ICD-10-CM | POA: Diagnosis not present

## 2023-01-27 DIAGNOSIS — M25611 Stiffness of right shoulder, not elsewhere classified: Secondary | ICD-10-CM | POA: Diagnosis not present

## 2023-01-27 DIAGNOSIS — M531 Cervicobrachial syndrome: Secondary | ICD-10-CM | POA: Diagnosis not present

## 2023-01-27 DIAGNOSIS — M25511 Pain in right shoulder: Secondary | ICD-10-CM | POA: Diagnosis not present

## 2023-01-30 DIAGNOSIS — M25611 Stiffness of right shoulder, not elsewhere classified: Secondary | ICD-10-CM | POA: Diagnosis not present

## 2023-01-30 DIAGNOSIS — M25511 Pain in right shoulder: Secondary | ICD-10-CM | POA: Diagnosis not present

## 2023-01-30 DIAGNOSIS — M531 Cervicobrachial syndrome: Secondary | ICD-10-CM | POA: Diagnosis not present

## 2023-02-03 DIAGNOSIS — M531 Cervicobrachial syndrome: Secondary | ICD-10-CM | POA: Diagnosis not present

## 2023-02-03 DIAGNOSIS — M25511 Pain in right shoulder: Secondary | ICD-10-CM | POA: Diagnosis not present

## 2023-02-03 DIAGNOSIS — M25611 Stiffness of right shoulder, not elsewhere classified: Secondary | ICD-10-CM | POA: Diagnosis not present

## 2023-02-06 DIAGNOSIS — M25611 Stiffness of right shoulder, not elsewhere classified: Secondary | ICD-10-CM | POA: Diagnosis not present

## 2023-02-06 DIAGNOSIS — M531 Cervicobrachial syndrome: Secondary | ICD-10-CM | POA: Diagnosis not present

## 2023-02-06 DIAGNOSIS — M25511 Pain in right shoulder: Secondary | ICD-10-CM | POA: Diagnosis not present

## 2023-02-08 DIAGNOSIS — M25511 Pain in right shoulder: Secondary | ICD-10-CM | POA: Diagnosis not present

## 2023-02-08 DIAGNOSIS — M25611 Stiffness of right shoulder, not elsewhere classified: Secondary | ICD-10-CM | POA: Diagnosis not present

## 2023-02-08 DIAGNOSIS — F319 Bipolar disorder, unspecified: Secondary | ICD-10-CM | POA: Diagnosis not present

## 2023-02-08 DIAGNOSIS — M531 Cervicobrachial syndrome: Secondary | ICD-10-CM | POA: Diagnosis not present

## 2023-02-10 DIAGNOSIS — M531 Cervicobrachial syndrome: Secondary | ICD-10-CM | POA: Diagnosis not present

## 2023-02-10 DIAGNOSIS — M25511 Pain in right shoulder: Secondary | ICD-10-CM | POA: Diagnosis not present

## 2023-02-10 DIAGNOSIS — M25611 Stiffness of right shoulder, not elsewhere classified: Secondary | ICD-10-CM | POA: Diagnosis not present

## 2023-02-15 DIAGNOSIS — M25611 Stiffness of right shoulder, not elsewhere classified: Secondary | ICD-10-CM | POA: Diagnosis not present

## 2023-02-15 DIAGNOSIS — M25571 Pain in right ankle and joints of right foot: Secondary | ICD-10-CM | POA: Diagnosis not present

## 2023-02-15 DIAGNOSIS — M25511 Pain in right shoulder: Secondary | ICD-10-CM | POA: Diagnosis not present

## 2023-02-15 DIAGNOSIS — M531 Cervicobrachial syndrome: Secondary | ICD-10-CM | POA: Diagnosis not present

## 2023-02-17 DIAGNOSIS — M531 Cervicobrachial syndrome: Secondary | ICD-10-CM | POA: Diagnosis not present

## 2023-02-17 DIAGNOSIS — M25611 Stiffness of right shoulder, not elsewhere classified: Secondary | ICD-10-CM | POA: Diagnosis not present

## 2023-02-17 DIAGNOSIS — M25511 Pain in right shoulder: Secondary | ICD-10-CM | POA: Diagnosis not present

## 2023-02-17 DIAGNOSIS — M76822 Posterior tibial tendinitis, left leg: Secondary | ICD-10-CM | POA: Diagnosis not present

## 2023-02-22 DIAGNOSIS — M531 Cervicobrachial syndrome: Secondary | ICD-10-CM | POA: Diagnosis not present

## 2023-02-22 DIAGNOSIS — M25611 Stiffness of right shoulder, not elsewhere classified: Secondary | ICD-10-CM | POA: Diagnosis not present

## 2023-02-22 DIAGNOSIS — M25511 Pain in right shoulder: Secondary | ICD-10-CM | POA: Diagnosis not present

## 2023-02-28 ENCOUNTER — Ambulatory Visit (INDEPENDENT_AMBULATORY_CARE_PROVIDER_SITE_OTHER): Payer: PPO | Admitting: Family Medicine

## 2023-02-28 ENCOUNTER — Encounter: Payer: Self-pay | Admitting: Family Medicine

## 2023-02-28 VITALS — BP 110/70 | HR 70 | Temp 97.7°F | Wt 225.0 lb

## 2023-02-28 DIAGNOSIS — J40 Bronchitis, not specified as acute or chronic: Secondary | ICD-10-CM | POA: Diagnosis not present

## 2023-02-28 MED ORDER — AZITHROMYCIN 250 MG PO TABS: ORAL_TABLET | ORAL | 0 refills | Status: DC

## 2023-02-28 NOTE — Progress Notes (Signed)
   Subjective:    Patient ID: [name], female    DOB: [birth year], 57 y.o.   MRN: [id number]  HPI Here for one week of chest congestion and coughing up yellow sputum. No SOB or fever. Taking Mucinex.    Review of Systems  Constitutional: Negative.   HENT:  Negative for congestion, ear pain, postnasal drip, sinus pressure and sore throat.   Eyes: Negative.   Respiratory:  Positive for cough. Negative for shortness of breath and wheezing.        Objective:   Physical Exam Constitutional:      Appearance: Normal appearance. She is not ill-appearing.  HENT:     Right Ear: Tympanic membrane, ear canal and external ear normal.     Left Ear: Tympanic membrane, ear canal and external ear normal.     Nose: Nose normal.     Mouth/Throat:     Pharynx: Oropharynx is clear.  Eyes:     Conjunctiva/sclera: Conjunctivae normal.  Pulmonary:     Effort: Pulmonary effort is normal.     Breath sounds: Wheezing and rhonchi present. No rales.  Lymphadenopathy:     Cervical: No cervical adenopathy.  Neurological:     Mental Status: She is alert.           Assessment & Plan:  Bronchitis, treat with a Zpack. Add Delsym as needed.  Alysia Penna, MD

## 2023-03-01 ENCOUNTER — Encounter: Payer: Self-pay | Admitting: Family Medicine

## 2023-03-01 DIAGNOSIS — E669 Obesity, unspecified: Secondary | ICD-10-CM

## 2023-03-06 DIAGNOSIS — M25511 Pain in right shoulder: Secondary | ICD-10-CM | POA: Diagnosis not present

## 2023-03-06 DIAGNOSIS — M25611 Stiffness of right shoulder, not elsewhere classified: Secondary | ICD-10-CM | POA: Diagnosis not present

## 2023-03-06 DIAGNOSIS — M531 Cervicobrachial syndrome: Secondary | ICD-10-CM | POA: Diagnosis not present

## 2023-03-07 DIAGNOSIS — M76822 Posterior tibial tendinitis, left leg: Secondary | ICD-10-CM | POA: Diagnosis not present

## 2023-03-08 DIAGNOSIS — M25511 Pain in right shoulder: Secondary | ICD-10-CM | POA: Diagnosis not present

## 2023-03-08 DIAGNOSIS — M25611 Stiffness of right shoulder, not elsewhere classified: Secondary | ICD-10-CM | POA: Diagnosis not present

## 2023-03-08 DIAGNOSIS — M531 Cervicobrachial syndrome: Secondary | ICD-10-CM | POA: Diagnosis not present

## 2023-03-10 DIAGNOSIS — M25611 Stiffness of right shoulder, not elsewhere classified: Secondary | ICD-10-CM | POA: Diagnosis not present

## 2023-03-10 DIAGNOSIS — M25511 Pain in right shoulder: Secondary | ICD-10-CM | POA: Diagnosis not present

## 2023-03-10 DIAGNOSIS — M531 Cervicobrachial syndrome: Secondary | ICD-10-CM | POA: Diagnosis not present

## 2023-03-13 DIAGNOSIS — M25611 Stiffness of right shoulder, not elsewhere classified: Secondary | ICD-10-CM | POA: Diagnosis not present

## 2023-03-13 DIAGNOSIS — M531 Cervicobrachial syndrome: Secondary | ICD-10-CM | POA: Diagnosis not present

## 2023-03-13 DIAGNOSIS — M25511 Pain in right shoulder: Secondary | ICD-10-CM | POA: Diagnosis not present

## 2023-03-15 DIAGNOSIS — M531 Cervicobrachial syndrome: Secondary | ICD-10-CM | POA: Diagnosis not present

## 2023-03-15 DIAGNOSIS — M25611 Stiffness of right shoulder, not elsewhere classified: Secondary | ICD-10-CM | POA: Diagnosis not present

## 2023-03-15 DIAGNOSIS — M25511 Pain in right shoulder: Secondary | ICD-10-CM | POA: Diagnosis not present

## 2023-03-17 DIAGNOSIS — M531 Cervicobrachial syndrome: Secondary | ICD-10-CM | POA: Diagnosis not present

## 2023-03-17 DIAGNOSIS — M25511 Pain in right shoulder: Secondary | ICD-10-CM | POA: Diagnosis not present

## 2023-03-17 DIAGNOSIS — M25611 Stiffness of right shoulder, not elsewhere classified: Secondary | ICD-10-CM | POA: Diagnosis not present

## 2023-03-20 DIAGNOSIS — M531 Cervicobrachial syndrome: Secondary | ICD-10-CM | POA: Diagnosis not present

## 2023-03-20 DIAGNOSIS — M25511 Pain in right shoulder: Secondary | ICD-10-CM | POA: Diagnosis not present

## 2023-03-20 DIAGNOSIS — M25611 Stiffness of right shoulder, not elsewhere classified: Secondary | ICD-10-CM | POA: Diagnosis not present

## 2023-03-22 DIAGNOSIS — M25511 Pain in right shoulder: Secondary | ICD-10-CM | POA: Diagnosis not present

## 2023-03-22 DIAGNOSIS — M531 Cervicobrachial syndrome: Secondary | ICD-10-CM | POA: Diagnosis not present

## 2023-03-22 DIAGNOSIS — M25611 Stiffness of right shoulder, not elsewhere classified: Secondary | ICD-10-CM | POA: Diagnosis not present

## 2023-03-24 DIAGNOSIS — M25511 Pain in right shoulder: Secondary | ICD-10-CM | POA: Diagnosis not present

## 2023-03-24 DIAGNOSIS — M25611 Stiffness of right shoulder, not elsewhere classified: Secondary | ICD-10-CM | POA: Diagnosis not present

## 2023-03-24 DIAGNOSIS — M531 Cervicobrachial syndrome: Secondary | ICD-10-CM | POA: Diagnosis not present

## 2023-03-27 DIAGNOSIS — M531 Cervicobrachial syndrome: Secondary | ICD-10-CM | POA: Diagnosis not present

## 2023-03-27 DIAGNOSIS — M25511 Pain in right shoulder: Secondary | ICD-10-CM | POA: Diagnosis not present

## 2023-03-27 DIAGNOSIS — M25611 Stiffness of right shoulder, not elsewhere classified: Secondary | ICD-10-CM | POA: Diagnosis not present

## 2023-03-29 ENCOUNTER — Encounter: Payer: Self-pay | Admitting: Family Medicine

## 2023-03-29 ENCOUNTER — Ambulatory Visit (INDEPENDENT_AMBULATORY_CARE_PROVIDER_SITE_OTHER): Payer: PPO | Admitting: Family Medicine

## 2023-03-29 VITALS — BP 116/78 | HR 65 | Temp 97.9°F | Ht 67.5 in | Wt 229.8 lb

## 2023-03-29 DIAGNOSIS — R739 Hyperglycemia, unspecified: Secondary | ICD-10-CM | POA: Diagnosis not present

## 2023-03-29 DIAGNOSIS — Z0000 Encounter for general adult medical examination without abnormal findings: Secondary | ICD-10-CM

## 2023-03-29 DIAGNOSIS — E559 Vitamin D deficiency, unspecified: Secondary | ICD-10-CM | POA: Diagnosis not present

## 2023-03-29 DIAGNOSIS — E538 Deficiency of other specified B group vitamins: Secondary | ICD-10-CM | POA: Diagnosis not present

## 2023-03-29 DIAGNOSIS — Z13220 Encounter for screening for lipoid disorders: Secondary | ICD-10-CM

## 2023-03-29 DIAGNOSIS — M531 Cervicobrachial syndrome: Secondary | ICD-10-CM | POA: Diagnosis not present

## 2023-03-29 DIAGNOSIS — M25511 Pain in right shoulder: Secondary | ICD-10-CM | POA: Diagnosis not present

## 2023-03-29 DIAGNOSIS — M25611 Stiffness of right shoulder, not elsewhere classified: Secondary | ICD-10-CM | POA: Diagnosis not present

## 2023-03-29 LAB — COMPREHENSIVE METABOLIC PANEL
ALT: 18 U/L (ref 0–35)
AST: 18 U/L (ref 0–37)
Albumin: 4.3 g/dL (ref 3.5–5.2)
Alkaline Phosphatase: 68 U/L (ref 39–117)
BUN: 12 mg/dL (ref 6–23)
CO2: 29 mEq/L (ref 19–32)
Calcium: 9.6 mg/dL (ref 8.4–10.5)
Chloride: 104 mEq/L (ref 96–112)
Creatinine, Ser: 0.81 mg/dL (ref 0.40–1.20)
GFR: 79.61 mL/min (ref >60.00)
Glucose, Bld: 99 mg/dL (ref 70–99)
Potassium: 4.7 mEq/L (ref 3.5–5.1)
Sodium: 139 mEq/L (ref 135–145)
Total Bilirubin: 0.6 mg/dL (ref 0.2–1.2)
Total Protein: 7.2 g/dL (ref 6.0–8.3)

## 2023-03-29 LAB — VITAMIN D 25 HYDROXY (VIT D DEFICIENCY, FRACTURES): VITD: 22.22 ng/mL — ABNORMAL LOW (ref 30.00–100.00)

## 2023-03-29 LAB — LIPID PANEL
Cholesterol: 241 mg/dL — ABNORMAL HIGH (ref 0–200)
HDL: 60.1 mg/dL (ref >39.00)
LDL Cholesterol: 153 mg/dL — ABNORMAL HIGH (ref 0–99)
NonHDL: 181.29
Total CHOL/HDL Ratio: 4
Triglycerides: 141 mg/dL (ref 0.0–149.0)
VLDL: 28.2 mg/dL (ref 0.0–40.0)

## 2023-03-29 LAB — VITAMIN B12: Vitamin B-12: 690 pg/mL (ref 211–911)

## 2023-03-29 LAB — HEMOGLOBIN A1C: Hgb A1c MFr Bld: 5.9 % (ref 4.6–6.5)

## 2023-03-29 NOTE — Patient Instructions (Addendum)
Total calorie intake: 1800 calories per day  Total protein intake: at least 100- 115 grams per day  Total carbohydrate intake: reduce to less than 90 grams per day  Total fiber intake: 35 grams per day   Stay hydrated-- at least 80 ounces of water per day  Take a daily multivitamin

## 2023-03-29 NOTE — Progress Notes (Signed)
Complete physical exam  Patient: [name]   DOB: 05-04-1966   57 y.o. Female  MRN: [id number]  Subjective:    No chief complaint on file.   [name] is a 56 y.o. female who presents today for a complete physical exam. She reports consuming a general diet.  Pt is still going to physical therapy three times a week. Had a foot surgery 1 year ago that was extensive, has trouble being on her feet for more than 2 hours.  She generally feels well. She reports sleeping well. She does not have additional problems to discuss today.    Most recent fall risk assessment:    03/29/2023    8:00 AM  Fall Risk   Falls in the past year? 0  Number falls in past yr: 0  Injury with Fall? 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 90 DAY SUPPLY   lamoTRIgine (LAMICTAL) 25 MG tablet Take 2 tablets (50 mg total) by mouth daily. (Patient taking differently: Take 25 mg by mouth daily.)   [DISCONTINUED] azithromycin (ZITHROMAX Z-PAK) 250 MG tablet As directed   [DISCONTINUED]  traZODone (DESYREL) 50 MG tablet Take 1.5 tablets (75 mg total) by mouth at bedtime as needed for sleep.   No facility-administered medications prior to visit.    Review of Systems  HENT:  Negative for hearing loss.   Eyes:  Negative for blurred vision.  Respiratory:  Negative for shortness of breath.   Cardiovascular:  Negative for chest pain.  Gastrointestinal: Negative.   Genitourinary: Negative.   Musculoskeletal:  Negative for back pain.  Neurological:  Negative for headaches.  Psychiatric/Behavioral:  Negative for depression.   All other systems reviewed and are negative.      Objective:     BP 116/78 (BP Location: Right Arm, Patient Position: Sitting, Cuff Size: Large)   Pulse 65   Temp 97.9 F (36.6 C) (Oral)   Ht 5' 7.5" (1.715 m)   Wt 229 lb 12.8 oz (104.2 kg)   LMP 11/29/2015 (Approximate)   SpO2 98%   BMI 35.46 kg/m    Physical Exam Vitals reviewed.  Constitutional:      Appearance: Normal appearance. She is well-groomed and normal weight.  HENT:     Right Ear: Tympanic membrane normal.     Left Ear: Tympanic membrane normal.     Mouth/Throat:     Mouth: Mucous membranes are moist.     Pharynx:  No posterior oropharyngeal erythema.  Eyes:     Conjunctiva/sclera: Conjunctivae normal.  Neck:     Thyroid: No thyromegaly.  Cardiovascular:     Rate and Rhythm: Normal rate and regular rhythm.     Pulses: Normal pulses.     Heart sounds: S1 normal and S2 normal.  Pulmonary:     Effort: Pulmonary effort is normal.     Breath sounds: Normal breath sounds and air entry.  Abdominal:     General: Bowel sounds are normal.  Musculoskeletal:     Right lower leg: No edema.     Left lower leg: No edema.  Lymphadenopathy:     Cervical: No cervical adenopathy.  Neurological:     Mental Status: She is alert and oriented to person, place, and time. Mental status is at baseline.     Gait: Gait is intact.  Psychiatric:        Mood and Affect: Mood and affect  normal.        Speech: Speech normal.        Behavior: Behavior normal.        Judgment: Judgment normal.      No results found for any visits on 03/29/23.     Assessment & Plan:    Routine Health Maintenance and Physical Exam  Immunization History  Administered Date(s) Administered   Influenza Whole 08/28/2010   Influenza,inj,Quad PF,6+ Mos 10/11/2018   PFIZER(Purple Top)SARS-COV-2 Vaccination 02/29/2020, 03/24/2020   Td 12/31/2018    Health Maintenance  Topic Date Due   Medicare Annual Wellness (AWV)  03/10/2023   Zoster Vaccines- Shingrix (1 of 2) 06/29/2023 (Originally 02/07/1983)   PAP SMEAR-Modifier  12/30/2023 (Originally 06/28/2022)   COVID-19 Vaccine (3 - Pfizer risk series) 03/28/2024 (Originally 04/21/2020)   HIV Screening  06/20/2026 (Originally 02/07/1979)   INFLUENZA VACCINE  06/29/2023   MAMMOGRAM  12/29/2024   DTaP/Tdap/Td (2 - Tdap) 12/31/2028   COLONOSCOPY (Pts 45-62yrs Insurance coverage will need to be confirmed)  09/13/2030   Hepatitis C Screening  Completed   HPV VACCINES  Aged Out    Discussed health benefits of physical activity, and encouraged her to engage in regular exercise appropriate for her age and condition.  Vitamin D deficiency -     VITAMIN D 25 Hydroxy (Vit-D Deficiency, Fractures)  B12 deficiency -     Vitamin B12  Preventative health care -     Comprehensive metabolic panel Normal physical exam findings today. We had a long discussion about weight loss and I gave the patient parameters to start diet and exercise changes to help. I am checking annual labs today. Handouts on low carb foods was also given to the patient.  Lipid screening -     Lipid panel  Hyperglycemia -     Hemoglobin A1c    No follow-ups on file.     Karie Georges, MD

## 2023-03-31 DIAGNOSIS — M25511 Pain in right shoulder: Secondary | ICD-10-CM | POA: Diagnosis not present

## 2023-03-31 DIAGNOSIS — M531 Cervicobrachial syndrome: Secondary | ICD-10-CM | POA: Diagnosis not present

## 2023-03-31 DIAGNOSIS — M25611 Stiffness of right shoulder, not elsewhere classified: Secondary | ICD-10-CM | POA: Diagnosis not present

## 2023-04-03 DIAGNOSIS — M25511 Pain in right shoulder: Secondary | ICD-10-CM | POA: Diagnosis not present

## 2023-04-03 DIAGNOSIS — M531 Cervicobrachial syndrome: Secondary | ICD-10-CM | POA: Diagnosis not present

## 2023-04-03 DIAGNOSIS — M25611 Stiffness of right shoulder, not elsewhere classified: Secondary | ICD-10-CM | POA: Diagnosis not present

## 2023-04-05 DIAGNOSIS — M531 Cervicobrachial syndrome: Secondary | ICD-10-CM | POA: Diagnosis not present

## 2023-04-05 DIAGNOSIS — M25511 Pain in right shoulder: Secondary | ICD-10-CM | POA: Diagnosis not present

## 2023-04-05 DIAGNOSIS — M25611 Stiffness of right shoulder, not elsewhere classified: Secondary | ICD-10-CM | POA: Diagnosis not present

## 2023-04-07 DIAGNOSIS — M531 Cervicobrachial syndrome: Secondary | ICD-10-CM | POA: Diagnosis not present

## 2023-04-07 DIAGNOSIS — M25511 Pain in right shoulder: Secondary | ICD-10-CM | POA: Diagnosis not present

## 2023-04-07 DIAGNOSIS — M25611 Stiffness of right shoulder, not elsewhere classified: Secondary | ICD-10-CM | POA: Diagnosis not present

## 2023-04-12 DIAGNOSIS — M25611 Stiffness of right shoulder, not elsewhere classified: Secondary | ICD-10-CM | POA: Diagnosis not present

## 2023-04-12 DIAGNOSIS — M531 Cervicobrachial syndrome: Secondary | ICD-10-CM | POA: Diagnosis not present

## 2023-04-12 DIAGNOSIS — M25511 Pain in right shoulder: Secondary | ICD-10-CM | POA: Diagnosis not present

## 2023-04-14 DIAGNOSIS — M531 Cervicobrachial syndrome: Secondary | ICD-10-CM | POA: Diagnosis not present

## 2023-04-14 DIAGNOSIS — M25611 Stiffness of right shoulder, not elsewhere classified: Secondary | ICD-10-CM | POA: Diagnosis not present

## 2023-04-14 DIAGNOSIS — M25511 Pain in right shoulder: Secondary | ICD-10-CM | POA: Diagnosis not present

## 2023-04-17 ENCOUNTER — Other Ambulatory Visit: Payer: Self-pay | Admitting: Family Medicine

## 2023-04-17 DIAGNOSIS — M25511 Pain in right shoulder: Secondary | ICD-10-CM | POA: Diagnosis not present

## 2023-04-17 DIAGNOSIS — M25611 Stiffness of right shoulder, not elsewhere classified: Secondary | ICD-10-CM | POA: Diagnosis not present

## 2023-04-17 DIAGNOSIS — F319 Bipolar disorder, unspecified: Secondary | ICD-10-CM

## 2023-04-17 DIAGNOSIS — M531 Cervicobrachial syndrome: Secondary | ICD-10-CM | POA: Diagnosis not present

## 2023-04-19 DIAGNOSIS — M76822 Posterior tibial tendinitis, left leg: Secondary | ICD-10-CM | POA: Diagnosis not present

## 2023-04-21 DIAGNOSIS — M25611 Stiffness of right shoulder, not elsewhere classified: Secondary | ICD-10-CM | POA: Diagnosis not present

## 2023-04-21 DIAGNOSIS — M25511 Pain in right shoulder: Secondary | ICD-10-CM | POA: Diagnosis not present

## 2023-04-21 DIAGNOSIS — M531 Cervicobrachial syndrome: Secondary | ICD-10-CM | POA: Diagnosis not present

## 2023-04-26 DIAGNOSIS — M25511 Pain in right shoulder: Secondary | ICD-10-CM | POA: Diagnosis not present

## 2023-04-26 DIAGNOSIS — M531 Cervicobrachial syndrome: Secondary | ICD-10-CM | POA: Diagnosis not present

## 2023-04-26 DIAGNOSIS — M25611 Stiffness of right shoulder, not elsewhere classified: Secondary | ICD-10-CM | POA: Diagnosis not present

## 2023-04-28 DIAGNOSIS — M531 Cervicobrachial syndrome: Secondary | ICD-10-CM | POA: Diagnosis not present

## 2023-04-28 DIAGNOSIS — M25611 Stiffness of right shoulder, not elsewhere classified: Secondary | ICD-10-CM | POA: Diagnosis not present

## 2023-04-28 DIAGNOSIS — M25511 Pain in right shoulder: Secondary | ICD-10-CM | POA: Diagnosis not present

## 2023-05-01 DIAGNOSIS — M531 Cervicobrachial syndrome: Secondary | ICD-10-CM | POA: Diagnosis not present

## 2023-05-01 DIAGNOSIS — M25611 Stiffness of right shoulder, not elsewhere classified: Secondary | ICD-10-CM | POA: Diagnosis not present

## 2023-05-01 DIAGNOSIS — M25511 Pain in right shoulder: Secondary | ICD-10-CM | POA: Diagnosis not present

## 2023-05-03 ENCOUNTER — Encounter: Payer: Self-pay | Admitting: Family Medicine

## 2023-05-03 ENCOUNTER — Telehealth: Payer: Self-pay | Admitting: *Deleted

## 2023-05-03 NOTE — Telephone Encounter (Signed)
Patient was noted on a list given to me by clinical supervisor for lack of recent mammogram results.  I spoke with the patient and she stated she had her last mammogram with the GYN office.  Fax request sent to [facility] for results.

## 2023-05-05 DIAGNOSIS — M25611 Stiffness of right shoulder, not elsewhere classified: Secondary | ICD-10-CM | POA: Diagnosis not present

## 2023-05-05 DIAGNOSIS — M531 Cervicobrachial syndrome: Secondary | ICD-10-CM | POA: Diagnosis not present

## 2023-05-05 DIAGNOSIS — M25511 Pain in right shoulder: Secondary | ICD-10-CM | POA: Diagnosis not present

## 2023-05-08 ENCOUNTER — Encounter: Payer: Self-pay | Admitting: Family Medicine

## 2023-05-08 DIAGNOSIS — M25511 Pain in right shoulder: Secondary | ICD-10-CM | POA: Diagnosis not present

## 2023-05-08 DIAGNOSIS — M531 Cervicobrachial syndrome: Secondary | ICD-10-CM | POA: Diagnosis not present

## 2023-05-08 DIAGNOSIS — M25611 Stiffness of right shoulder, not elsewhere classified: Secondary | ICD-10-CM | POA: Diagnosis not present

## 2023-05-10 DIAGNOSIS — M531 Cervicobrachial syndrome: Secondary | ICD-10-CM | POA: Diagnosis not present

## 2023-05-10 DIAGNOSIS — M25511 Pain in right shoulder: Secondary | ICD-10-CM | POA: Diagnosis not present

## 2023-05-10 DIAGNOSIS — M25611 Stiffness of right shoulder, not elsewhere classified: Secondary | ICD-10-CM | POA: Diagnosis not present

## 2023-05-12 DIAGNOSIS — M25511 Pain in right shoulder: Secondary | ICD-10-CM | POA: Diagnosis not present

## 2023-05-12 DIAGNOSIS — M531 Cervicobrachial syndrome: Secondary | ICD-10-CM | POA: Diagnosis not present

## 2023-05-12 DIAGNOSIS — M25611 Stiffness of right shoulder, not elsewhere classified: Secondary | ICD-10-CM | POA: Diagnosis not present

## 2023-05-24 DIAGNOSIS — M25511 Pain in right shoulder: Secondary | ICD-10-CM | POA: Diagnosis not present

## 2023-05-24 DIAGNOSIS — M25611 Stiffness of right shoulder, not elsewhere classified: Secondary | ICD-10-CM | POA: Diagnosis not present

## 2023-05-24 DIAGNOSIS — M531 Cervicobrachial syndrome: Secondary | ICD-10-CM | POA: Diagnosis not present

## 2023-05-26 DIAGNOSIS — M531 Cervicobrachial syndrome: Secondary | ICD-10-CM | POA: Diagnosis not present

## 2023-05-26 DIAGNOSIS — M25511 Pain in right shoulder: Secondary | ICD-10-CM | POA: Diagnosis not present

## 2023-05-26 DIAGNOSIS — M25611 Stiffness of right shoulder, not elsewhere classified: Secondary | ICD-10-CM | POA: Diagnosis not present

## 2023-05-31 DIAGNOSIS — M25511 Pain in right shoulder: Secondary | ICD-10-CM | POA: Diagnosis not present

## 2023-05-31 DIAGNOSIS — M25611 Stiffness of right shoulder, not elsewhere classified: Secondary | ICD-10-CM | POA: Diagnosis not present

## 2023-05-31 DIAGNOSIS — M531 Cervicobrachial syndrome: Secondary | ICD-10-CM | POA: Diagnosis not present

## 2023-06-05 DIAGNOSIS — M25611 Stiffness of right shoulder, not elsewhere classified: Secondary | ICD-10-CM | POA: Diagnosis not present

## 2023-06-05 DIAGNOSIS — M25511 Pain in right shoulder: Secondary | ICD-10-CM | POA: Diagnosis not present

## 2023-06-05 DIAGNOSIS — M531 Cervicobrachial syndrome: Secondary | ICD-10-CM | POA: Diagnosis not present

## 2023-06-06 DIAGNOSIS — M531 Cervicobrachial syndrome: Secondary | ICD-10-CM | POA: Diagnosis not present

## 2023-06-06 DIAGNOSIS — M25511 Pain in right shoulder: Secondary | ICD-10-CM | POA: Diagnosis not present

## 2023-06-06 DIAGNOSIS — M25611 Stiffness of right shoulder, not elsewhere classified: Secondary | ICD-10-CM | POA: Diagnosis not present

## 2023-06-07 DIAGNOSIS — H43812 Vitreous degeneration, left eye: Secondary | ICD-10-CM | POA: Diagnosis not present

## 2023-06-07 DIAGNOSIS — H5203 Hypermetropia, bilateral: Secondary | ICD-10-CM | POA: Diagnosis not present

## 2023-06-09 DIAGNOSIS — M25611 Stiffness of right shoulder, not elsewhere classified: Secondary | ICD-10-CM | POA: Diagnosis not present

## 2023-06-09 DIAGNOSIS — M531 Cervicobrachial syndrome: Secondary | ICD-10-CM | POA: Diagnosis not present

## 2023-06-09 DIAGNOSIS — M25511 Pain in right shoulder: Secondary | ICD-10-CM | POA: Diagnosis not present

## 2023-08-08 DIAGNOSIS — M531 Cervicobrachial syndrome: Secondary | ICD-10-CM | POA: Diagnosis not present

## 2023-08-08 DIAGNOSIS — M25511 Pain in right shoulder: Secondary | ICD-10-CM | POA: Diagnosis not present

## 2023-08-08 DIAGNOSIS — M25611 Stiffness of right shoulder, not elsewhere classified: Secondary | ICD-10-CM | POA: Diagnosis not present

## 2023-08-10 DIAGNOSIS — M531 Cervicobrachial syndrome: Secondary | ICD-10-CM | POA: Diagnosis not present

## 2023-08-10 DIAGNOSIS — M25511 Pain in right shoulder: Secondary | ICD-10-CM | POA: Diagnosis not present

## 2023-08-10 DIAGNOSIS — M25611 Stiffness of right shoulder, not elsewhere classified: Secondary | ICD-10-CM | POA: Diagnosis not present

## 2023-08-18 DIAGNOSIS — M25611 Stiffness of right shoulder, not elsewhere classified: Secondary | ICD-10-CM | POA: Diagnosis not present

## 2023-08-18 DIAGNOSIS — M531 Cervicobrachial syndrome: Secondary | ICD-10-CM | POA: Diagnosis not present

## 2023-08-18 DIAGNOSIS — M25511 Pain in right shoulder: Secondary | ICD-10-CM | POA: Diagnosis not present

## 2023-08-22 DIAGNOSIS — M25611 Stiffness of right shoulder, not elsewhere classified: Secondary | ICD-10-CM | POA: Diagnosis not present

## 2023-08-22 DIAGNOSIS — M25511 Pain in right shoulder: Secondary | ICD-10-CM | POA: Diagnosis not present

## 2023-08-22 DIAGNOSIS — M531 Cervicobrachial syndrome: Secondary | ICD-10-CM | POA: Diagnosis not present

## 2023-08-29 DIAGNOSIS — M531 Cervicobrachial syndrome: Secondary | ICD-10-CM | POA: Diagnosis not present

## 2023-08-29 DIAGNOSIS — M25511 Pain in right shoulder: Secondary | ICD-10-CM | POA: Diagnosis not present

## 2023-08-29 DIAGNOSIS — M25611 Stiffness of right shoulder, not elsewhere classified: Secondary | ICD-10-CM | POA: Diagnosis not present

## 2023-08-31 DIAGNOSIS — M25611 Stiffness of right shoulder, not elsewhere classified: Secondary | ICD-10-CM | POA: Diagnosis not present

## 2023-08-31 DIAGNOSIS — M531 Cervicobrachial syndrome: Secondary | ICD-10-CM | POA: Diagnosis not present

## 2023-08-31 DIAGNOSIS — M25511 Pain in right shoulder: Secondary | ICD-10-CM | POA: Diagnosis not present

## 2023-09-04 ENCOUNTER — Encounter: Payer: PPO | Admitting: Family Medicine

## 2023-09-07 DIAGNOSIS — M25611 Stiffness of right shoulder, not elsewhere classified: Secondary | ICD-10-CM | POA: Diagnosis not present

## 2023-09-07 DIAGNOSIS — M25511 Pain in right shoulder: Secondary | ICD-10-CM | POA: Diagnosis not present

## 2023-09-07 DIAGNOSIS — M531 Cervicobrachial syndrome: Secondary | ICD-10-CM | POA: Diagnosis not present

## 2023-09-08 DIAGNOSIS — M25611 Stiffness of right shoulder, not elsewhere classified: Secondary | ICD-10-CM | POA: Diagnosis not present

## 2023-09-08 DIAGNOSIS — M531 Cervicobrachial syndrome: Secondary | ICD-10-CM | POA: Diagnosis not present

## 2023-09-08 DIAGNOSIS — M25511 Pain in right shoulder: Secondary | ICD-10-CM | POA: Diagnosis not present

## 2023-09-12 DIAGNOSIS — M531 Cervicobrachial syndrome: Secondary | ICD-10-CM | POA: Diagnosis not present

## 2023-09-12 DIAGNOSIS — M25611 Stiffness of right shoulder, not elsewhere classified: Secondary | ICD-10-CM | POA: Diagnosis not present

## 2023-09-12 DIAGNOSIS — M25511 Pain in right shoulder: Secondary | ICD-10-CM | POA: Diagnosis not present

## 2023-09-14 DIAGNOSIS — M25511 Pain in right shoulder: Secondary | ICD-10-CM | POA: Diagnosis not present

## 2023-09-14 DIAGNOSIS — M25611 Stiffness of right shoulder, not elsewhere classified: Secondary | ICD-10-CM | POA: Diagnosis not present

## 2023-09-14 DIAGNOSIS — M531 Cervicobrachial syndrome: Secondary | ICD-10-CM | POA: Diagnosis not present

## 2023-09-28 DIAGNOSIS — M25511 Pain in right shoulder: Secondary | ICD-10-CM | POA: Diagnosis not present

## 2023-09-28 DIAGNOSIS — M25611 Stiffness of right shoulder, not elsewhere classified: Secondary | ICD-10-CM | POA: Diagnosis not present

## 2023-09-28 DIAGNOSIS — M531 Cervicobrachial syndrome: Secondary | ICD-10-CM | POA: Diagnosis not present

## 2023-10-02 ENCOUNTER — Ambulatory Visit: Payer: PPO | Admitting: Family Medicine

## 2023-10-12 ENCOUNTER — Encounter (INDEPENDENT_AMBULATORY_CARE_PROVIDER_SITE_OTHER): Payer: Medicare HMO | Admitting: Adult Health

## 2023-10-12 DIAGNOSIS — Z0289 Encounter for other administrative examinations: Secondary | ICD-10-CM

## 2023-10-16 ENCOUNTER — Encounter: Payer: Self-pay | Admitting: Family Medicine

## 2023-10-16 ENCOUNTER — Ambulatory Visit (INDEPENDENT_AMBULATORY_CARE_PROVIDER_SITE_OTHER): Payer: HMO | Admitting: Family Medicine

## 2023-10-16 VITALS — BP 120/80 | HR 76 | Temp 98.1°F | Ht 67.5 in | Wt 238.4 lb

## 2023-10-16 DIAGNOSIS — R739 Hyperglycemia, unspecified: Secondary | ICD-10-CM | POA: Diagnosis not present

## 2023-10-16 DIAGNOSIS — E538 Deficiency of other specified B group vitamins: Secondary | ICD-10-CM | POA: Diagnosis not present

## 2023-10-16 DIAGNOSIS — F319 Bipolar disorder, unspecified: Secondary | ICD-10-CM

## 2023-10-16 DIAGNOSIS — E559 Vitamin D deficiency, unspecified: Secondary | ICD-10-CM | POA: Diagnosis not present

## 2023-10-16 LAB — HEMOGLOBIN A1C: Hgb A1c MFr Bld: 6 % (ref 4.6–6.5)

## 2023-10-16 LAB — VITAMIN D 25 HYDROXY (VIT D DEFICIENCY, FRACTURES): VITD: 20.81 ng/mL — ABNORMAL LOW (ref 30.00–100.00)

## 2023-10-16 LAB — VITAMIN B12: Vitamin B-12: 280 pg/mL (ref 211–911)

## 2023-10-16 NOTE — Patient Instructions (Signed)
Vitamin D daily requirement-- [phone] units per day

## 2023-10-16 NOTE — Progress Notes (Unsigned)
   Established Patient Office Visit  Subjective   Patient ID: [name], female    DOB: [birth year]  Age: 57 y.o. MRN: [id number]  Chief Complaint  Patient presents with   Medical Management of Chronic Issues    Pt is here for 6 month follow up today. States that she is getting [facility].   B12 and vitamin D deficiency-- pt states she did take her vitamin D over the counter, did not get her B12 supplements however. She denies any associated symptoms or issues.  Reviewed last set of labs, she had some hyperglycemia, will check A1C today.     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     Objective:     BP 120/80 (BP Location: Right Arm, Patient Position: Sitting, Cuff Size: Large)   Pulse 76   Temp 98.1 F (36.7 C) (Oral)   Ht 5' 7.5" (1.715 m)   Wt 238 lb 6.4 oz (108.1 kg)   LMP 11/29/2015 (Approximate)   SpO2 98%   BMI 36.79 kg/m    Physical Exam Vitals reviewed.  Constitutional:      Appearance: Normal appearance. She is obese.  Neck:     Thyroid: No thyromegaly.  Cardiovascular:     Rate and Rhythm: Normal rate and regular rhythm.     Pulses: Normal pulses.     Heart sounds: Normal heart sounds. No murmur heard. Pulmonary:     Effort: Pulmonary effort is normal.     Breath sounds: Normal breath sounds.  No wheezing.  Neurological:     Mental Status: She is alert and oriented to person, place, and time.  Psychiatric:        Mood and Affect: Mood normal.        Behavior: Behavior normal.      The 10-year ASCVD risk score (Arnett DK, et al., 2019) is: 2.9%    Assessment & Plan:  Vitamin D deficiency -     Vitamin B12 -     Vitamin D (Ergocalciferol); Take 1 capsule (50,000 Units total) by mouth every 7 (seven) days.  Dispense: 12 capsule; Refill: 1  B12 deficiency -     VITAMIN D 25 Hydroxy (Vit-D Deficiency, Fractures) -     Vitamin B-12; Take 1 tablet (100 mcg total) by mouth daily.  Dispense: 90 tablet; Refill: 1  Hyperglycemia -     Hemoglobin A1c   Pt has pap scheduled in December with Dr. Henderson Cloud. Rechecking vitamin levels today to ensure improvement. Checking A1C to look for prediabetes.   Return in about 6 months (around 04/14/2024) for annual physical exam.    Karie Georges, MD

## 2023-10-17 MED ORDER — VITAMIN D (ERGOCALCIFEROL) 1.25 MG (50000 UNIT) PO CAPS
50000.0000 [IU] | ORAL_CAPSULE | ORAL | 1 refills | Status: DC
Start: 2023-10-17 — End: 2024-04-02

## 2023-10-17 MED ORDER — VITAMIN B-12 100 MCG PO TABS
100.0000 ug | ORAL_TABLET | Freq: Every day | ORAL | 1 refills | Status: DC
Start: 2023-10-17 — End: 2024-04-02

## 2023-10-24 ENCOUNTER — Other Ambulatory Visit: Payer: Self-pay | Admitting: Family Medicine

## 2023-10-24 DIAGNOSIS — F319 Bipolar disorder, unspecified: Secondary | ICD-10-CM

## 2023-11-08 ENCOUNTER — Encounter (INDEPENDENT_AMBULATORY_CARE_PROVIDER_SITE_OTHER): Payer: Self-pay | Admitting: Internal Medicine

## 2023-11-08 ENCOUNTER — Ambulatory Visit (INDEPENDENT_AMBULATORY_CARE_PROVIDER_SITE_OTHER): Payer: HMO | Admitting: Internal Medicine

## 2023-11-08 VITALS — BP 117/79 | HR 67 | Temp 97.8°F | Ht 67.0 in | Wt 237.0 lb

## 2023-11-08 DIAGNOSIS — Z6837 Body mass index (BMI) 37.0-37.9, adult: Secondary | ICD-10-CM

## 2023-11-08 DIAGNOSIS — E66812 Obesity, class 2: Secondary | ICD-10-CM

## 2023-11-08 DIAGNOSIS — R7303 Prediabetes: Secondary | ICD-10-CM | POA: Diagnosis not present

## 2023-11-08 DIAGNOSIS — E7800 Pure hypercholesterolemia, unspecified: Secondary | ICD-10-CM | POA: Diagnosis not present

## 2023-11-08 NOTE — Assessment & Plan Note (Signed)
We reviewed anthropometrics, biometrics, associated medical conditions and contributing factors with patient. she would benefit from a medically tailored reduced calorie nutrional plan based on her REE (resting energy expenditure), which will be determined by indirect calorimetry.  We will also assess for cardiometabolic risk and nutritional derangements via fasting labs at intake appointment.

## 2023-11-08 NOTE — Assessment & Plan Note (Signed)
Most recent A1c is  Lab Results  Component Value Date   HGBA1C 6.0 10/16/2023   HGBA1C 6.1 (H) 06/01/2011    Patient aware of disease state and risk of progression. This may contribute to abnormal cravings, fatigue and diabetic complications without having diabetes.   We have discussed treatment options which include: losing 7 to 10% of body weight, increasing physical activity to a goal of 150 minutes a week at moderate intensity.  Advised to maintain a diet low on simple and processed carbohydrates.  She may also be a candidate for pharmacoprophylaxis with metformin or incretin mimetic.

## 2023-11-08 NOTE — Assessment & Plan Note (Signed)
LDL is not at goal. Elevated LDL may be secondary to nutrition, genetics and spillover effect from excess adiposity. Recommended LDL goal is <70 to reduce the risk of fatty streaks and the progression to obstructive ASCVD in the future.   Her 10 year risk is: The 10-year ASCVD risk score (Arnett DK, et al., 2019) is: 2.7%  Lab Results  Component Value Date   CHOL 241 (H) 03/29/2023   HDL 60.10 03/29/2023   LDLCALC 153 (H) 03/29/2023   TRIG 141.0 03/29/2023   CHOLHDL 4 03/29/2023     losing 10% or more of body weight may improve condition. Also advised to reduce saturated fats in diet to less than 10% of daily calories.

## 2023-11-08 NOTE — Progress Notes (Signed)
Office: [phone]  /  Fax: [phone]   Initial Visit  [name] was seen in clinic today to evaluate for obesity. She is interested in losing weight to improve overall health and reduce the risk of weight related complications. She presents today to review program treatment options, initial physical assessment, and evaluation.     She was referred by: PCP  When asked what else they would like to accomplish? She states: Improve quality of life, Improve self-confidence, and Lose a target amount of weight : 80 lbs in 12-18 months.  Weight history: Gained weight as an adult, gradually, foot surgery  When asked how has your weight affected you? She states: Contributed to medical problems and Contributed to orthopedic problems or mobility issues  Some associated conditions: Hyperlipidemia and Prediabetes  Contributing factors: Family history of obesity, Moderate to high levels of stress, Reduced physical activity, and Menopause  Weight promoting medications identified: None  Current nutrition plan: Portion control / smart choices  Current level of physical activity: None  Current or previous pharmacotherapy: None and Other: prefers not have to use medications  Response to medication: Never tried medications   Past medical history includes:   Past Medical History:  Diagnosis Date   ALLERGIC RHINITIS 01/10/2008   ANXIETY 01/10/2008   ATTENTION DEFICIT HYPERACTIVITY DISORDER, ADULT 10/28/2009   BIPOLAR DISORDER UNSPECIFIED 01/10/2008   CHEST PAIN, ATYPICAL 01/10/2008   DEPRESSION 01/10/2008   INSOMNIA 09/17/2010   PANIC DISORDER 01/10/2008   PHLEBITIS\T\THROMBOPHLEB SUP VESSELS LOWER EXTREM 07/05/2010   PLANTAR WART, LEFT 12/29/2008   Sebaceous cyst    scalp, seeing general surgeon     Objective:   BP 117/79   Pulse 67   Temp 97.8 F (36.6 C)   Ht 5\' 7"  (1.702 m)   Wt 237 lb (107.5 kg)   LMP 11/29/2015 (Approximate)   SpO2 98%   BMI 37.12 kg/m  She was weighed on  the bioimpedance scale: Body mass index is 37.12 kg/m.  Peak Weight:237 , Body Fat%:46, Visceral Fat Rating:14, Weight trend over the last 12 months: Unchanged  General:  Alert, oriented and cooperative. Patient is in no acute distress.  Respiratory: Normal respiratory effort, no problems with respiration noted   Gait: able to ambulate independently  Mental Status: Normal mood and affect. Normal behavior. Normal judgment and thought content.   DIAGNOSTIC DATA REVIEWED:  BMET    Component Value Date/Time   NA 139 03/29/2023 0841   K 4.7 03/29/2023 0841   CL 104 03/29/2023 0841   CO2 29 03/29/2023 0841   GLUCOSE 99 03/29/2023 0841   BUN 12 03/29/2023 0841   CREATININE 0.81 03/29/2023 0841   CREATININE 0.84 08/28/2020 1508   CALCIUM 9.6 03/29/2023 0841   GFRNONAA 84 (L) 10/23/2011 1637   GFRAA >90 10/23/2011 1637   Lab Results  Component Value Date   HGBA1C 6.0 10/16/2023   HGBA1C 6.1 (H) 06/01/2011   No results found for: "INSULIN" CBC    Component Value Date/Time   WBC 4.9 04/06/2022 1057   RBC 4.71 04/06/2022 1057   HGB 14.3 04/06/2022 1057   HCT 43.7 04/06/2022 1057   PLT 254.0 04/06/2022 1057   MCV 92.8 04/06/2022 1057   MCH 30.8 08/28/2020 1508   MCHC 32.8 04/06/2022 1057   RDW 14.2 04/06/2022 1057   Iron/TIBC/Ferritin/ %Sat No results found for: "IRON", "TIBC", "FERRITIN", "IRONPCTSAT" Lipid Panel     Component Value Date/Time   CHOL 241 (H) 03/29/2023 0841   TRIG  141.0 03/29/2023 0841   HDL 60.10 03/29/2023 0841   CHOLHDL 4 03/29/2023 0841   VLDL 28.2 03/29/2023 0841   LDLCALC 153 (H) 03/29/2023 0841   LDLCALC 159 (H) 08/28/2020 1508   Hepatic Function Panel     Component Value Date/Time   PROT 7.2 03/29/2023 0841   ALBUMIN 4.3 03/29/2023 0841   AST 18 03/29/2023 0841   ALT 18 03/29/2023 0841   ALKPHOS 68 03/29/2023 0841   BILITOT 0.6 03/29/2023 0841      Component Value Date/Time   TSH 2.52 01/15/2020 0806     Assessment and Plan:    Pure hypercholesterolemia Assessment & Plan: LDL is not at goal. Elevated LDL may be secondary to nutrition, genetics and spillover effect from excess adiposity. Recommended LDL goal is <70 to reduce the risk of fatty streaks and the progression to obstructive ASCVD in the future.   Her 10 year risk is: The 10-year ASCVD risk score (Arnett DK, et al., 2019) is: 2.7%  Lab Results  Component Value Date   CHOL 241 (H) 03/29/2023   HDL 60.10 03/29/2023   LDLCALC 153 (H) 03/29/2023   TRIG 141.0 03/29/2023   CHOLHDL 4 03/29/2023     losing 10% or more of body weight may improve condition. Also advised to reduce saturated fats in diet to less than 10% of daily calories.        Prediabetes Assessment & Plan: Most recent A1c is  Lab Results  Component Value Date   HGBA1C 6.0 10/16/2023   HGBA1C 6.1 (H) 06/01/2011    Patient aware of disease state and risk of progression. This may contribute to abnormal cravings, fatigue and diabetic complications without having diabetes.   We have discussed treatment options which include: losing 7 to 10% of body weight, increasing physical activity to a goal of 150 minutes a week at moderate intensity.  Advised to maintain a diet low on simple and processed carbohydrates.  She may also be a candidate for pharmacoprophylaxis with metformin or incretin mimetic.     Class 2 severe obesity with serious comorbidity and body mass index (BMI) of 37.0 to 37.9 in adult, unspecified obesity type [facility]) Assessment & Plan: We reviewed anthropometrics, biometrics, associated medical conditions and contributing factors with patient. she would benefit from a medically tailored reduced calorie nutrional plan based on her REE (resting energy expenditure), which will be determined by indirect calorimetry.  We will also assess for cardiometabolic risk and nutritional derangements via fasting labs at intake appointment.           Obesity Treatment / Action  Plan:  Patient will work on garnering support from family and friends to begin weight loss journey. Will work on eliminating or reducing the presence of highly palatable, calorie dense foods in the home. Will complete provided nutritional and psychosocial assessment questionnaire before the next appointment. Will be scheduled for indirect calorimetry to determine resting energy expenditure in a fasting state.  This will allow [place] to create a reduced calorie, high-protein meal plan to promote loss of fat mass while preserving muscle mass. Counseled on the health benefits of losing 5%-15% of total body weight. Was counseled on nutritional approaches to weight loss and benefits of reducing processed foods and consuming plant-based foods and high quality protein as part of nutritional weight management. Was counseled on pharmacotherapy and role as an adjunct in weight management.   Obesity Education Performed Today:  She was weighed on the bioimpedance scale and results were discussed  and documented in the synopsis.  We discussed obesity as a disease and the importance of a more detailed evaluation of all the factors contributing to the disease.  We discussed the importance of long term lifestyle changes which include nutrition, exercise and behavioral modifications as well as the importance of customizing this to her specific health and social needs.  We discussed the benefits of reaching a healthier weight to alleviate the symptoms of existing conditions and reduce the risks of the biomechanical, metabolic and psychological effects of obesity.  [name] appears to be in the action stage of change and states they are ready to start intensive lifestyle modifications and behavioral modifications.  30 minutes was spent today on this visit including the above counseling, pre-visit chart review, and post-visit documentation.  Reviewed by clinician on day of visit: allergies, medications,  problem list, medical history, surgical history, family history, social history, and previous encounter notes pertinent to obesity diagnosis.   Worthy Rancher, MD

## 2023-11-14 DIAGNOSIS — Z124 Encounter for screening for malignant neoplasm of cervix: Secondary | ICD-10-CM | POA: Diagnosis not present

## 2023-11-14 DIAGNOSIS — Z01419 Encounter for gynecological examination (general) (routine) without abnormal findings: Secondary | ICD-10-CM | POA: Diagnosis not present

## 2023-11-17 LAB — HM PAP SMEAR
HM Pap smear: NEGATIVE
HPV, high-risk: NEGATIVE

## 2023-12-13 ENCOUNTER — Ambulatory Visit (INDEPENDENT_AMBULATORY_CARE_PROVIDER_SITE_OTHER): Payer: HMO | Admitting: Internal Medicine

## 2023-12-13 ENCOUNTER — Encounter (INDEPENDENT_AMBULATORY_CARE_PROVIDER_SITE_OTHER): Payer: Self-pay | Admitting: Internal Medicine

## 2023-12-13 ENCOUNTER — Other Ambulatory Visit: Payer: Self-pay

## 2023-12-13 VITALS — BP 132/82 | HR 77 | Temp 98.3°F | Ht 67.0 in | Wt 234.0 lb

## 2023-12-13 DIAGNOSIS — Z1331 Encounter for screening for depression: Secondary | ICD-10-CM | POA: Diagnosis not present

## 2023-12-13 DIAGNOSIS — R0602 Shortness of breath: Secondary | ICD-10-CM

## 2023-12-13 DIAGNOSIS — E66812 Obesity, class 2: Secondary | ICD-10-CM | POA: Diagnosis not present

## 2023-12-13 DIAGNOSIS — E7800 Pure hypercholesterolemia, unspecified: Secondary | ICD-10-CM | POA: Diagnosis not present

## 2023-12-13 DIAGNOSIS — Z6836 Body mass index (BMI) 36.0-36.9, adult: Secondary | ICD-10-CM

## 2023-12-13 DIAGNOSIS — R5383 Other fatigue: Secondary | ICD-10-CM

## 2023-12-13 DIAGNOSIS — E559 Vitamin D deficiency, unspecified: Secondary | ICD-10-CM

## 2023-12-13 DIAGNOSIS — Z8349 Family history of other endocrine, nutritional and metabolic diseases: Secondary | ICD-10-CM | POA: Diagnosis not present

## 2023-12-13 DIAGNOSIS — R7303 Prediabetes: Secondary | ICD-10-CM | POA: Diagnosis not present

## 2023-12-13 NOTE — Assessment & Plan Note (Signed)
 Most recent vitamin D  levels  Lab Results  Component Value Date   VD25OH 20.81 (L) 10/16/2023   VD25OH 22.22 (L) 03/29/2023   VD25OH 21.79 (L) 04/06/2022     Deficiency state associated with adiposity and may result in leptin resistance, weight gain and fatigue. Currently on vitamin D  supplementation without any adverse effects.  Plan:Check VIT D in Feb or March. Consider eval for celiac due to multiple nutritional deficiencies.

## 2023-12-13 NOTE — Assessment & Plan Note (Signed)
 See obesity treatment plan

## 2023-12-13 NOTE — Assessment & Plan Note (Signed)
 LDL is not at goal. Elevated LDL may be secondary to nutrition, genetics and spillover effect from excess adiposity. Recommended LDL goal is <70 to reduce the risk of fatty streaks and the progression to obstructive ASCVD in the future.   Her 10 year risk is: The 10-year ASCVD risk score (Arnett DK, et al., 2019) is: 3.5%  Lab Results  Component Value Date   CHOL 241 (H) 03/29/2023   HDL 60.10 03/29/2023   LDLCALC 153 (H) 03/29/2023   TRIG 141.0 03/29/2023   CHOLHDL 4 03/29/2023    Losing 10% or more of body weight may improve condition. Also advised to reduce saturated fats in diet to less than 10% of daily calories.

## 2023-12-13 NOTE — Assessment & Plan Note (Signed)
 Patient reports family of hemochromatosis and requesting screening. Previous liver enzymes were normal. Check iron studies.

## 2023-12-13 NOTE — Progress Notes (Signed)
 Office: [phone]  /  Fax: [phone]   Subjective   Initial Visit  [name] (MR# [id number]) is a 58 y.o. female who presents for evaluation and treatment of obesity and related comorbidities. Current BMI is Body mass index is 37.12 kg/m. [name] has been struggling with her weight for many years and has been unsuccessful in either losing weight, maintaining weight loss, or reaching her healthy weight goal.  [name] is currently in the action stage of change and ready to dedicate time achieving and maintaining a healthier weight. [name] is interested in becoming our patient and working on intensive lifestyle modifications including (but not limited to) diet and exercise for weight loss.  When asked how their weight has affected their life and health, she states: Contributed to medical problems, Having fatigue, and Having poor endurance  When asked what else they would like to accomplish? She states: Improve energy levels and physical activity, Improve existing medical conditions, Improve quality of life, and Lose a target amount of weight : 80 lbs in 12-18 months.  Weight history:  She starting to note weight gain during : adulthood.  Life events associated with weight gain include : medical illness and foot surgery .   Other contributing factors: Family history of obesity, Moderate to high levels of stress, Reduced physical activity, and Menopause.  Their highest weight has been:  237 lbs.  Desired weight: 155  Previous weight-loss programs : None.  Their maximum weight loss was:  NA lbs.  Their greatest challenge with dieting: meal preparation and cooking.  Weight promoting medications identified: None  Current or previous pharmacotherapy: None.  Response to medication: Never tried medications  Nutritional History:  Current nutrition plan: Portion control / smart choices.  How many times do you eat outside the home: 1-2 per week  How often do they eat breakfast :  3-5 days a week.  Number of times they eat per day: 3  What beverages do they drink: alcohol, type: gin, 1-2 drinks per week.   Use of artificial sweetners : No  Food intolerances or dislikes: none.  Food triggers: Stress, Boredom, and To help comfort self.  Food cravings: Sugary, Starches, and ice cream  Do they struggle with excessive hunger or portion control : No   Current level of physical activity: None  Past medical history includes:   Past Medical History:  Diagnosis Date   ALLERGIC RHINITIS 01/10/2008   ANXIETY 01/10/2008   ATTENTION DEFICIT HYPERACTIVITY DISORDER, ADULT 10/28/2009   BIPOLAR DISORDER UNSPECIFIED 01/10/2008   CHEST PAIN, ATYPICAL 01/10/2008   Constipation    DEPRESSION 01/10/2008   Edema of both lower legs    Hx of blood clots    INSOMNIA 09/17/2010   PANIC DISORDER 01/10/2008   PHLEBITIS\T\THROMBOPHLEB SUP VESSELS LOWER EXTREM 07/05/2010   PLANTAR WART, LEFT 12/29/2008   Pre-diabetes    Sebaceous cyst    scalp, seeing general surgeon   Vitamin B 12 deficiency    Vitamin D  deficiency      Objective   BP 132/82   Pulse 77   Temp 98.3 F (36.8 C)   Ht 5\' 7"  (1.702 m)   LMP 11/29/2015 (Approximate)   SpO2 98%   BMI 37.12 kg/m  She was weighed on the bioimpedance scale: Body mass index is 37.12 kg/m.    Anthropometrics:  Vitals Temp: 98.3 F (36.8 C) BP: 132/82 Pulse Rate: 77 SpO2: 98 %   Anthropometric Measurements Height: 5\' 7"  (1.702 m) Peak Weight: 237 lb  Waist Measurement : 46 inches   No data recorded Other Clinical Data RMR: [id number] Fasting: Yes Labs: Yes Today's Visit #: 1 Starting Date: 12/13/23    Physical Exam:  General: She is overweight, cooperative, alert, well developed, and in no acute distress. PSYCH: Has normal mood, affect and thought process.   HEENT: EOMI, sclerae are anicteric. Lungs: Normal breathing effort, no conversational dyspnea. Extremities: No edema.  Neurologic: No gross sensory  or motor deficits. No tremors or fasciculations noted.    Diagnostic Data Reviewed  EKG: Normal sinus rhythm, rate 73. No conduction abnormalities, abnormal Q waves or chamber enlargement.  Indirect Calorimeter completed today shows a VO2 of 242 and a REE of 1670.  Her calculated basal metabolic rate is 1308 thus her resting energy expenditure slower than calculated.  Depression Screen  Nuriya's PHQ-9 score was: 12.     10/16/2023    4:30 PM  Depression screen PHQ 2/9  Decreased Interest 0  Down, Depressed, Hopeless 0  PHQ - 2 Score 0  Altered sleeping 0  Tired, decreased energy 0  Change in appetite 0  Feeling bad or failure about yourself  0  Trouble concentrating 0  Moving slowly or fidgety/restless 0  Suicidal thoughts 0  PHQ-9 Score 0    Screening for Sleep Related Breathing Disorders  Keyundra denies daytime somnolence and denies waking up still tired. Patient has a history of symptoms of morning headache. Sumayo generally gets 8 hours of sleep per night, and states that she has generally restful sleep. Snoring is not present. Apneic episodes are not present. Epworth Sleepiness Score is 1.   BMET    Component Value Date/Time   NA 139 03/29/2023 0841   K 4.7 03/29/2023 0841   CL 104 03/29/2023 0841   CO2 29 03/29/2023 0841   GLUCOSE 99 03/29/2023 0841   BUN 12 03/29/2023 0841   CREATININE 0.81 03/29/2023 0841   CREATININE 0.84 08/28/2020 1508   CALCIUM  9.6 03/29/2023 0841   GFRNONAA 84 (L) 10/23/2011 1637   GFRAA >90 10/23/2011 1637   Lab Results  Component Value Date   HGBA1C 6.0 10/16/2023   HGBA1C 6.1 (H) 06/01/2011   No results found for: "INSULIN " CBC    Component Value Date/Time   WBC 4.9 04/06/2022 1057   RBC 4.71 04/06/2022 1057   HGB 14.3 04/06/2022 1057   HCT 43.7 04/06/2022 1057   PLT 254.0 04/06/2022 1057   MCV 92.8 04/06/2022 1057   MCH 30.8 08/28/2020 1508   MCHC 32.8 04/06/2022 1057   RDW 14.2 04/06/2022 1057   Iron/TIBC/Ferritin/  %Sat No results found for: "IRON", "TIBC", "FERRITIN", "IRONPCTSAT" Lipid Panel     Component Value Date/Time   CHOL 241 (H) 03/29/2023 0841   TRIG 141.0 03/29/2023 0841   HDL 60.10 03/29/2023 0841   CHOLHDL 4 03/29/2023 0841   VLDL 28.2 03/29/2023 0841   LDLCALC 153 (H) 03/29/2023 0841   LDLCALC 159 (H) 08/28/2020 1508   Hepatic Function Panel     Component Value Date/Time   PROT 7.2 03/29/2023 0841   ALBUMIN 4.3 03/29/2023 0841   AST 18 03/29/2023 0841   ALT 18 03/29/2023 0841   ALKPHOS 68 03/29/2023 0841   BILITOT 0.6 03/29/2023 0841      Component Value Date/Time   TSH 2.52 01/15/2020 0806     Assessment and Plan   TREATMENT PLAN FOR OBESITY:  Recommended Dietary Goals  Palin is currently in the action stage of change. As such, her goal  is to implement medically supervised weight loss plan.  She has agreed to implement: the Category 2 plan - 1200 kcal per day  Behavioral Intervention  We discussed the following Behavioral Modification Strategies today: increasing lean protein intake to established goals, decreasing simple carbohydrates , increasing vegetables, increasing lower glycemic fruits, increasing fiber rich foods, avoiding skipping meals, increasing water intake, work on meal planning and preparation, reading food labels , keeping healthy foods at home, identifying sources and decreasing liquid calories, decreasing eating out or consumption of processed foods, and making healthy choices when eating convenient foods, planning for success, and better snacking choices  Additional resources provided today:  Cat 2 packet  Recommended Physical Activity Goals  Farheen has been advised to work up to 150 minutes of moderate intensity aerobic activity a week and strengthening exercises 2-3 times per week for cardiovascular health, weight loss maintenance and preservation of muscle mass.   She has agreed to :  Think about enjoyable ways to increase daily physical  activity and overcoming barriers to exercise and Increase physical activity in their day and reduce sedentary time (increase NEAT).  Pharmacotherapy We will work on building a Therapist, art and behavioral strategies. We will discuss the role of pharmacotherapy as an adjunct at subsequent visits.   ASSOCIATED CONDITIONS ADDRESSED TODAY  Other Fatigue Mickaila denies daytime somnolence and denies waking up still tired. Patient has a history of symptoms of morning headache. Areeba generally gets 8 hours of sleep per night, and states that she has generally restful sleep. Snoring is not present. Apneic episodes are not present. Epworth Sleepiness Score is 1. Gregary Lean does feel that her weight is causing her energy to be lower than it should be. Fatigue may be related to obesity, depression or many other causes. Labs will be ordered, and in the meanwhile, Anelie will focus on self care including making healthy food choices, increasing physical activity and focusing on stress reduction.  Shortness of Breath Yaneri notes increasing shortness of breath with exercising and seems to be worsening over time with weight gain. She notes getting out of breath sooner with activity than she used to. This has not gotten worse recently. Zamirah denies shortness of breath at rest or orthopnea.Gregary Lean notes increasing shortness of breath with exercising and seems to be worsening over time with weight gain. She notes getting out of breath sooner with activity than she used to. This has not gotten worse recently. Kathia denies shortness of breath at rest or orthopnea.  Other fatigue -     EKG 12-Lead  SOB (shortness of breath) on exertion  Depression screen  Prediabetes Assessment & Plan: Most recent A1c is  Lab Results  Component Value Date   HGBA1C 6.0 10/16/2023   HGBA1C 6.1 (H) 06/01/2011    Patient aware of disease state and risk of progression. This may contribute to abnormal cravings, fatigue and  diabetic complications without having diabetes.   We have discussed treatment options which include: losing 7 to 10% of body weight, increasing physical activity to a goal of 150 minutes a week at moderate intensity.  Advised to maintain a diet low on simple and processed carbohydrates.  She may also be a candidate for pharmacoprophylaxis with metformin or incretin mimetic.    Orders: -     Insulin , random -     Iron and TIBC -     Ferritin  Pure hypercholesterolemia Assessment & Plan: LDL is not at goal. Elevated LDL may be secondary to  nutrition, genetics and spillover effect from excess adiposity. Recommended LDL goal is <70 to reduce the risk of fatty streaks and the progression to obstructive ASCVD in the future.   Her 10 year risk is: The 10-year ASCVD risk score (Arnett DK, et al., 2019) is: 3.5%  Lab Results  Component Value Date   CHOL 241 (H) 03/29/2023   HDL 60.10 03/29/2023   LDLCALC 153 (H) 03/29/2023   TRIG 141.0 03/29/2023   CHOLHDL 4 03/29/2023    Losing 10% or more of body weight may improve condition. Also advised to reduce saturated fats in diet to less than 10% of daily calories.       Orders: -     Comprehensive metabolic panel -     Lipid Panel With LDL/HDL Ratio  Vitamin D  deficiency Assessment & Plan: Most recent vitamin D  levels  Lab Results  Component Value Date   VD25OH 20.81 (L) 10/16/2023   VD25OH 22.22 (L) 03/29/2023   VD25OH 21.79 (L) 04/06/2022     Deficiency state associated with adiposity and may result in leptin resistance, weight gain and fatigue. Currently on vitamin D  supplementation without any adverse effects.  Plan:Check VIT D in Feb or March. Consider eval for celiac due to multiple nutritional deficiencies.     Class 2 severe obesity with serious comorbidity and body mass index (BMI) of 36.0 to 36.9 in adult, unspecified obesity type [facility]) Assessment & Plan: See obesity treatment plan  Orders: -     CBC with  Differential/Platelet -     TSH  Family history of hemochromatosis Assessment & Plan: Patient reports family of hemochromatosis and requesting screening. Previous liver enzymes were normal. Check iron studies.      Follow-up  She was informed of the importance of frequent follow-up visits to maximize her success with intensive lifestyle modifications for her multiple health conditions. She was informed we would discuss her lab results at her next visit unless there is a critical issue that needs to be addressed sooner. Gregary Lean agreed to keep her next visit at the agreed upon time to discuss these results.  Attestation Statement  This is the patient's intake visit at [facility]. The patient's Health Questionnaire was reviewed at length. Included in the packet: current and past health history, medications, allergies, ROS, gynecologic history (women only), surgical history, family history, social history, weight history, weight loss surgery history (for those that have had weight loss surgery), nutritional evaluation, mood and food questionnaire, PHQ9, Epworth questionnaire, sleep habits questionnaire, patient life and health improvement goals questionnaire. These will all be scanned into the patient's chart under media.   During the visit, I independently reviewed the patient's EKG previous labs, bioimpedance scale results, and indirect calorimetry results. I used this information to medically tailor a meal plan for the patient that will help her to lose weight and will improve her obesity-related conditions. I performed a medically necessary appropriate examination and/or evaluation. I discussed the assessment and treatment plan with the patient. The patient was provided an opportunity to ask questions and all were answered. The patient agreed with the plan and demonstrated an understanding of the instructions. Labs were ordered at this visit and will be reviewed at the next visit  unless critical results need to be addressed immediately. Clinical information was updated and documented in the EMR.   In addition, they received basic education on identification of processed foods and reduction of these, different sources of lean proteins and complex carbohydrates and  how to eat balanced by incorporation of whole foods.  Reviewed by clinician on day of visit: allergies, medications, problem list, medical history, surgical history, family history, social history, and previous encounter notes.  I have spent 60 minutes in the care of the patient today including: preparing to see patient (e.g. review and interpretation of tests, old notes ), obtaining and/or reviewing separately obtained history, performing a medically appropriate examination or evaluation, counseling and educating the patient, documenting clinical information in the electronic or other health care record, and independently interpreting results and communicating results to the patient, family, or caregiver      Ladd Picker, MD

## 2023-12-13 NOTE — Assessment & Plan Note (Signed)
 Most recent A1c is  Lab Results  Component Value Date   HGBA1C 6.0 10/16/2023   HGBA1C 6.1 (H) 06/01/2011    Patient aware of disease state and risk of progression. This may contribute to abnormal cravings, fatigue and diabetic complications without having diabetes.   We have discussed treatment options which include: losing 7 to 10% of body weight, increasing physical activity to a goal of 150 minutes a week at moderate intensity.  Advised to maintain a diet low on simple and processed carbohydrates.  [name] may also be a candidate for pharmacoprophylaxis with metformin or incretin mimetic.

## 2023-12-14 LAB — COMPREHENSIVE METABOLIC PANEL
ALT: 22 [IU]/L (ref 0–32)
AST: 16 [IU]/L (ref 0–40)
Albumin: 4.5 g/dL (ref 3.8–4.9)
Alkaline Phosphatase: 87 [IU]/L (ref 44–121)
BUN/Creatinine Ratio: 17 (ref 9–23)
BUN: 14 mg/dL (ref 6–24)
Bilirubin Total: 0.4 mg/dL (ref 0.0–1.2)
CO2: 24 mmol/L (ref 20–29)
Calcium: 9.8 mg/dL (ref 8.7–10.2)
Chloride: 102 mmol/L (ref 96–106)
Creatinine, Ser: 0.81 mg/dL (ref 0.57–1.00)
Globulin, Total: 2.5 g/dL (ref 1.5–4.5)
Glucose: 91 mg/dL (ref 70–99)
Potassium: 4.5 mmol/L (ref 3.5–5.2)
Sodium: 139 mmol/L (ref 134–144)
Total Protein: 7 g/dL (ref 6.0–8.5)
eGFR: 84 mL/min/{1.73_m2} (ref >59)

## 2023-12-14 LAB — IRON AND TIBC
Iron Saturation: 32 % (ref 15–55)
Iron: 97 ug/dL (ref 27–159)
Total Iron Binding Capacity: 307 ug/dL (ref 250–450)
UIBC: 210 ug/dL (ref 131–425)

## 2023-12-14 LAB — INSULIN, RANDOM: INSULIN: 12.7 u[IU]/mL (ref 2.6–24.9)

## 2023-12-14 LAB — CBC WITH DIFFERENTIAL/PLATELET
Basophils Absolute: 0.1 10*3/uL (ref 0.0–0.2)
Basos: 1 %
EOS (ABSOLUTE): 0.2 10*3/uL (ref 0.0–0.4)
Eos: 4 %
Hematocrit: 43.3 % (ref 34.0–46.6)
Hemoglobin: 14 g/dL (ref 11.1–15.9)
Immature Grans (Abs): 0 10*3/uL (ref 0.0–0.1)
Immature Granulocytes: 0 %
Lymphocytes Absolute: 1.4 10*3/uL (ref 0.7–3.1)
Lymphs: 29 %
MCH: 29.6 pg (ref 26.6–33.0)
MCHC: 32.3 g/dL (ref 31.5–35.7)
MCV: 92 fL (ref 79–97)
Monocytes Absolute: 0.5 10*3/uL (ref 0.1–0.9)
Monocytes: 9 %
Neutrophils Absolute: 2.9 10*3/uL (ref 1.4–7.0)
Neutrophils: 57 %
Platelets: 273 10*3/uL (ref 150–450)
RBC: 4.73 x10E6/uL (ref 3.77–5.28)
RDW: 13.1 % (ref 11.7–15.4)
WBC: 5 10*3/uL (ref 3.4–10.8)

## 2023-12-14 LAB — TSH: TSH: 1.08 u[IU]/mL (ref 0.450–4.500)

## 2023-12-14 LAB — LIPID PANEL WITH LDL/HDL RATIO
Cholesterol, Total: 298 mg/dL — ABNORMAL HIGH (ref 100–199)
HDL: 60 mg/dL (ref >39)
LDL Chol Calc (NIH): 202 mg/dL — ABNORMAL HIGH (ref 0–99)
LDL/HDL Ratio: 3.4 {ratio} — ABNORMAL HIGH (ref 0.0–3.2)
Triglycerides: 190 mg/dL — ABNORMAL HIGH (ref 0–149)
VLDL Cholesterol Cal: 36 mg/dL (ref 5–40)

## 2023-12-14 LAB — FERRITIN: Ferritin: 81 ng/mL (ref 15–150)

## 2023-12-22 DIAGNOSIS — M76821 Posterior tibial tendinitis, right leg: Secondary | ICD-10-CM | POA: Diagnosis not present

## 2023-12-22 DIAGNOSIS — R262 Difficulty in walking, not elsewhere classified: Secondary | ICD-10-CM | POA: Diagnosis not present

## 2023-12-22 DIAGNOSIS — M25571 Pain in right ankle and joints of right foot: Secondary | ICD-10-CM | POA: Diagnosis not present

## 2023-12-22 DIAGNOSIS — M7661 Achilles tendinitis, right leg: Secondary | ICD-10-CM | POA: Diagnosis not present

## 2023-12-22 DIAGNOSIS — M25671 Stiffness of right ankle, not elsewhere classified: Secondary | ICD-10-CM | POA: Diagnosis not present

## 2023-12-26 DIAGNOSIS — M76821 Posterior tibial tendinitis, right leg: Secondary | ICD-10-CM | POA: Diagnosis not present

## 2023-12-26 DIAGNOSIS — M7661 Achilles tendinitis, right leg: Secondary | ICD-10-CM | POA: Diagnosis not present

## 2023-12-26 DIAGNOSIS — M25671 Stiffness of right ankle, not elsewhere classified: Secondary | ICD-10-CM | POA: Diagnosis not present

## 2023-12-26 DIAGNOSIS — M25571 Pain in right ankle and joints of right foot: Secondary | ICD-10-CM | POA: Diagnosis not present

## 2023-12-26 DIAGNOSIS — R262 Difficulty in walking, not elsewhere classified: Secondary | ICD-10-CM | POA: Diagnosis not present

## 2023-12-27 ENCOUNTER — Encounter (INDEPENDENT_AMBULATORY_CARE_PROVIDER_SITE_OTHER): Payer: Self-pay | Admitting: Internal Medicine

## 2023-12-27 ENCOUNTER — Ambulatory Visit (INDEPENDENT_AMBULATORY_CARE_PROVIDER_SITE_OTHER): Payer: HMO | Admitting: Internal Medicine

## 2023-12-27 VITALS — BP 109/74 | HR 76 | Temp 97.9°F | Ht 67.0 in | Wt 232.0 lb

## 2023-12-27 DIAGNOSIS — E66812 Obesity, class 2: Secondary | ICD-10-CM

## 2023-12-27 DIAGNOSIS — R7303 Prediabetes: Secondary | ICD-10-CM | POA: Diagnosis not present

## 2023-12-27 DIAGNOSIS — Z8349 Family history of other endocrine, nutritional and metabolic diseases: Secondary | ICD-10-CM

## 2023-12-27 DIAGNOSIS — Z6836 Body mass index (BMI) 36.0-36.9, adult: Secondary | ICD-10-CM | POA: Diagnosis not present

## 2023-12-27 DIAGNOSIS — E7800 Pure hypercholesterolemia, unspecified: Secondary | ICD-10-CM | POA: Diagnosis not present

## 2023-12-27 NOTE — Assessment & Plan Note (Signed)
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  I conveyed to her that this may not necessarily be nutritional or associated with obesity and may be more of a problem of how her body handles cholesterol.  We discussed reducing saturated fats in diet to less than 10% of calories.  Also reducing simple and added sugars.  I highly recommend that she start statin therapy for cardiovascular risk reduction but would like to try lifestyle changes first.  She may also benefit from further risk stratification with a coronary artery calcium score to help her make an informed decision.

## 2023-12-27 NOTE — Assessment & Plan Note (Signed)
See obesity treatment plan

## 2023-12-27 NOTE — Assessment & Plan Note (Signed)
Most recent A1c is  Lab Results  Component Value Date   HGBA1C 6.0 10/16/2023   HGBA1C 6.1 (H) 06/01/2011    Patient aware of disease state and risk of progression. This may contribute to abnormal cravings, fatigue and diabetic complications without having diabetes.   She also has elevated insulin levels.  We have discussed treatment options which include: losing 7 to 10% of body weight, increasing physical activity to a goal of 150 minutes a week at moderate intensity.  Advised to maintain a diet low on simple and processed carbohydrates.  She may also be a candidate for pharmacoprophylaxis with metformin or incretin mimetic.  She declines treatment with metformin at present time

## 2023-12-27 NOTE — Progress Notes (Signed)
Office: [phone]  /  Fax: [phone]  Weight Summary And Biometrics  Vitals Temp: 97.9 F (36.6 C) BP: 109/74 Pulse Rate: 76 SpO2: 96 %   Anthropometric Measurements Height: 5\' 7"  (1.702 m) Weight: 232 lb (105.2 kg) BMI (Calculated): 36.33 Weight at Last Visit: 234 lb Weight Lost Since Last Visit: 2 lb Weight Gained Since Last Visit: 0 lb Starting Weight: 234 lb Total Weight Loss (lbs): 2 lb (0.907 kg) Peak Weight: 237 lb   Body Composition  Body Fat %: 44.9 % Fat Mass (lbs): 104.4 lbs Muscle Mass (lbs): 121.6 lbs Total Body Water (lbs): 86.2 lbs Visceral Fat Rating : 13   The 10-year ASCVD risk score (Arnett DK, et al., 2019) is: 2.9%  RMR: 1770  Today's Visit #: 2  Starting Date: 12/13/23   Subjective   Chief Complaint: Obesity  Natasa is here to discuss her progress with her obesity treatment plan. She is on the the Category 2 Plan and states she is following her eating plan approximately 25 % of the time. She states she ihas just started walking.  Weight Progress Since Last Visit:  Discussed the use of AI scribe software for clinical note transcription with the patient, who gave verbal consent to proceed.  History of Present Illness   The patient, with obesity, prediabetes, and hypercholesterolemia, presents for medical weight management.  This is her second visit to the weight management program. Since her last visit, she has lost two pounds. She follows a reduced-calorie nutrition plan 25% of the time and has started walking for exercise, having walked twice in her neighborhood. She discusses challenges with meal preparation and consistency in her diet, needing more options and planning to avoid decision fatigue, especially when preparing meals for herself and her granddaughter. She describes a recent healthy dinner she prepared, which included chicken, peppers, broccoli, and a healthy barbecue sauce, but also included tater tots for her  granddaughter. She acknowledges the mental challenges of maintaining dietary changes and is working on shifting her mindset to view healthy eating as manageable and not overly restrictive.  She is concerned about her high cholesterol, which has been elevated since 2021, with a very high LDL cholesterol level of 202, likely familial in origin. She is interested in managing it through diet before considering medication.  Her blood sugar was 91, and her A1c was 6.0 in November, indicating prediabetes. She is aware of her insulin resistance and is considering dietary changes to manage her condition.  She has a family history of hemochromatosis and possibly high cholesterol, as her father had heart issues in his 39s.  She is involved in the care of her 71 year old granddaughter, which influences her meal planning and dietary choices.       Challenges affecting patient progress: low volume of physical activity at present .   Orexigenic Control: Denies problems with appetite and hunger signals.  Denies problems with satiety and satiation.  Denies problems with eating patterns and portion control.  Denies abnormal cravings. Denies feeling deprived or restricted.   Pharmacotherapy for weight management: She is currently taking no anti-obesity medication and patient has declined pharmacotherapy in past.   Assessment and Plan   Treatment Plan For Obesity:  Recommended Dietary Goals  Vianka is currently in the action stage of change. As such, her goal is to continue weight management plan. She has agreed to: continue current plan  Behavioral Health and Counseling  We discussed the following behavioral modification strategies today: continue to work  on maintaining a reduced calorie state, getting the recommended amount of protein, incorporating whole foods, making healthy choices, staying well hydrated and practicing mindfulness when eating..  Additional education and resources provided  today: None  Recommended Physical Activity Goals  Ronnica has been advised to work up to 150 minutes of moderate intensity aerobic activity a week and strengthening exercises 2-3 times per week for cardiovascular health, weight loss maintenance and preservation of muscle mass.   She has agreed to :  Think about enjoyable ways to increase daily physical activity and overcoming barriers to exercise and Increase physical activity in their day and reduce sedentary time (increase NEAT).  Pharmacotherapy  We discussed various medication options to help [facility] with her weight loss efforts and we both agreed to : continue with nutritional and behavioral strategies and has declined pharmacotherapy  Associated Conditions Impacted by Obesity Treatment  Prediabetes Assessment & Plan: Most recent A1c is  Lab Results  Component Value Date   HGBA1C 6.0 10/16/2023   HGBA1C 6.1 (H) 06/01/2011    Patient aware of disease state and risk of progression. This may contribute to abnormal cravings, fatigue and diabetic complications without having diabetes.   She also has elevated insulin levels.  We have discussed treatment options which include: losing 7 to 10% of body weight, increasing physical activity to a goal of 150 minutes a week at moderate intensity.  Advised to maintain a diet low on simple and processed carbohydrates.  She may also be a candidate for pharmacoprophylaxis with metformin or incretin mimetic.  She declines treatment with metformin at present time    Pure hypercholesterolemia Assessment & Plan: Patient has a very high LDL cholesterol suggestive of familial hypercholesterolemia.  I conveyed to her that this may not necessarily be nutritional or associated with obesity and may be more of a problem of how her body handles cholesterol.  We discussed reducing saturated fats in diet to less than 10% of calories.  Also reducing simple and added sugars.  I highly recommend that she start  statin therapy for cardiovascular risk reduction but would like to try lifestyle changes first.  She may also benefit from further risk stratification with a coronary artery calcium score to help her make an informed decision.   Class 2 severe obesity with serious comorbidity and body mass index (BMI) of 36.0 to 36.9 in adult, unspecified obesity type [facility]) Assessment & Plan: See obesity treatment plan   Family history of hemochromatosis Assessment & Plan: Patient reports family of hemochromatosis and requesting screening. Previous liver enzymes were normal. Check iron studies.  Recent iron studies were normal.  Continue to monitor     Assessment and Plan    Obesity Patient has lost 2 pounds since the last visit, following a reduced calorie nutrition plan 25% of the time and has started walking for exercise. Emphasized the importance of meal prep, balanced meals, and incorporating physical activity. Discussed strategies including protein-rich breakfasts and lunches, prepackaged healthy meal options, and protein shakes or smoothies as meal replacements. Encouraged aiming for 1-2 healthy meals per day initially and increasing physical activity to at least 150 minutes of aerobic exercise per week with strength training 2-3 times per week. - Encourage meal prep focusing on protein-rich breakfasts and lunches. - Consider prepackaged healthy meal options. - Incorporate protein shakes or smoothies as meal replacements if needed. - Aim for 1-2 healthy meals per day initially. - Increase physical activity to at least 150 minutes of aerobic exercise per week  and incorporate strength training 2-3 times per week.  Prediabetes Patient has an A1c of 6.0% and elevated insulin levels, indicating prediabetes with insulin resistance. Discussed the role of diet and exercise in managing blood sugar levels. Patient prefers lifestyle modifications before starting medication. Explained that losing 10% of body  weight and increasing physical activity can significantly reduce the risk of developing diabetes. Discussed potential use of metformin for appetite suppression and weight management if lifestyle changes are insufficient. - Continue with reduced calorie nutrition plan and increased physical activity. - Recheck A1c and insulin levels in 3 months. - Consider metformin for appetite suppression and weight management if lifestyle changes are insufficient.  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 Discussed the importance of reducing saturated fats and simple sugars in the diet. Patient prefers dietary changes before starting medication. Explained that an LDL level of 190 mg/dL or higher typically requires high-intensity statin therapy to reduce the risk of heart attack and stroke. Discussed the option of a coronary artery calcium score to assess plaque buildup if LDL remains elevated after dietary changes. - Reduce intake of saturated fats and simple sugars. - Recheck lipid panel in 3 months. - Consider coronary artery calcium score if LDL remains elevated after dietary changes.  General Health Maintenance Patient is due for a mammogram in February and a Cologuard test for colon cancer screening. Pap smear is up to date. - Schedule mammogram in February. - Complete Cologuard test for colon cancer screening. - Ensure up-to-date Pap smear.  Follow-up - Follow up in 3 weeks to review progress with weight management and dietary changes.        Objective   Physical Exam:  Blood pressure 109/74, pulse 76, temperature 97.9 F (36.6 C), height 5\' 7"  (1.702 m), weight 232 lb (105.2 kg), last menstrual period 11/29/2015, SpO2 96%. Body mass index is 36.34 kg/m.  General: She is overweight, cooperative, alert, well developed, and in no acute distress. PSYCH: Has  normal mood, affect and thought process.   HEENT: EOMI, sclerae are anicteric. Lungs: Normal breathing effort, no conversational dyspnea. Extremities: No edema.  Neurologic: No gross sensory or motor deficits. No tremors or fasciculations noted.    Diagnostic Data Reviewed:  BMET    Component Value Date/Time   NA 139 12/13/2023 1218   K 4.5 12/13/2023 1218   CL 102 12/13/2023 1218   CO2 24 12/13/2023 1218   GLUCOSE 91 12/13/2023 1218   GLUCOSE 99 03/29/2023 0841   BUN 14 12/13/2023 1218   CREATININE 0.81 12/13/2023 1218   CREATININE 0.84 08/28/2020 1508   CALCIUM 9.8 12/13/2023 1218   GFRNONAA 84 (L) 10/23/2011 1637   GFRAA >90 10/23/2011 1637   Lab Results  Component Value Date   HGBA1C 6.0 10/16/2023   HGBA1C 6.1 (H) 06/01/2011   Lab Results  Component Value Date   INSULIN 12.7 12/13/2023   Lab Results  Component Value Date   TSH 1.080 12/13/2023   CBC    Component Value Date/Time   WBC 5.0 12/13/2023 1218   WBC 4.9 04/06/2022 1057   RBC 4.73 12/13/2023 1218   RBC 4.71 04/06/2022 1057   HGB 14.0 12/13/2023 1218   HCT 43.3 12/13/2023 1218   PLT 273 12/13/2023 1218   MCV 92 12/13/2023 1218   MCH 29.6 12/13/2023 1218   MCH 30.8 08/28/2020 1508   MCHC 32.3 12/13/2023 1218   MCHC  32.8 04/06/2022 1057   RDW 13.1 12/13/2023 1218   Iron Studies    Component Value Date/Time   IRON 97 12/13/2023 1218   TIBC 307 12/13/2023 1218   FERRITIN 81 12/13/2023 1218   IRONPCTSAT 32 12/13/2023 1218   Lipid Panel     Component Value Date/Time   CHOL 298 (H) 12/13/2023 1218   TRIG 190 (H) 12/13/2023 1218   HDL 60 12/13/2023 1218   CHOLHDL 4 03/29/2023 0841   VLDL 28.2 03/29/2023 0841   LDLCALC 202 (H) 12/13/2023 1218   LDLCALC 159 (H) 08/28/2020 1508   Hepatic Function Panel     Component Value Date/Time   PROT 7.0 12/13/2023 1218   ALBUMIN 4.5 12/13/2023 1218   AST 16 12/13/2023 1218   ALT 22 12/13/2023 1218   ALKPHOS 87 12/13/2023 1218   BILITOT 0.4  12/13/2023 1218      Component Value Date/Time   TSH 1.080 12/13/2023 1218   Nutritional Lab Results  Component Value Date   VD25OH 20.81 (L) 10/16/2023   VD25OH 22.22 (L) 03/29/2023   VD25OH 21.79 (L) 04/06/2022    Follow-Up   Return in about 3 weeks (around 01/17/2024) for For Weight Mangement with Dr. Rikki Spearing.[name] She was informed of the importance of frequent follow up visits to maximize her success with intensive lifestyle modifications for her multiple health conditions.  Attestation Statement   Reviewed by clinician on day of visit: allergies, medications, problem list, medical history, surgical history, family history, social history, and previous encounter notes.     Worthy Rancher, MD

## 2023-12-27 NOTE — Assessment & Plan Note (Signed)
Patient reports family of hemochromatosis and requesting screening. Previous liver enzymes were normal. Check iron studies.  Recent iron studies were normal.  Continue to monitor

## 2023-12-28 DIAGNOSIS — R262 Difficulty in walking, not elsewhere classified: Secondary | ICD-10-CM | POA: Diagnosis not present

## 2023-12-28 DIAGNOSIS — M7661 Achilles tendinitis, right leg: Secondary | ICD-10-CM | POA: Diagnosis not present

## 2023-12-28 DIAGNOSIS — M76821 Posterior tibial tendinitis, right leg: Secondary | ICD-10-CM | POA: Diagnosis not present

## 2023-12-28 DIAGNOSIS — M25571 Pain in right ankle and joints of right foot: Secondary | ICD-10-CM | POA: Diagnosis not present

## 2023-12-28 DIAGNOSIS — M25671 Stiffness of right ankle, not elsewhere classified: Secondary | ICD-10-CM | POA: Diagnosis not present

## 2024-01-04 DIAGNOSIS — M25571 Pain in right ankle and joints of right foot: Secondary | ICD-10-CM | POA: Diagnosis not present

## 2024-01-04 DIAGNOSIS — M7661 Achilles tendinitis, right leg: Secondary | ICD-10-CM | POA: Diagnosis not present

## 2024-01-04 DIAGNOSIS — M76821 Posterior tibial tendinitis, right leg: Secondary | ICD-10-CM | POA: Diagnosis not present

## 2024-01-04 DIAGNOSIS — M25671 Stiffness of right ankle, not elsewhere classified: Secondary | ICD-10-CM | POA: Diagnosis not present

## 2024-01-11 DIAGNOSIS — M25671 Stiffness of right ankle, not elsewhere classified: Secondary | ICD-10-CM | POA: Diagnosis not present

## 2024-01-11 DIAGNOSIS — M7661 Achilles tendinitis, right leg: Secondary | ICD-10-CM | POA: Diagnosis not present

## 2024-01-11 DIAGNOSIS — M76821 Posterior tibial tendinitis, right leg: Secondary | ICD-10-CM | POA: Diagnosis not present

## 2024-01-11 DIAGNOSIS — M25571 Pain in right ankle and joints of right foot: Secondary | ICD-10-CM | POA: Diagnosis not present

## 2024-01-17 ENCOUNTER — Ambulatory Visit (INDEPENDENT_AMBULATORY_CARE_PROVIDER_SITE_OTHER): Payer: HMO | Admitting: Internal Medicine

## 2024-01-30 DIAGNOSIS — M7661 Achilles tendinitis, right leg: Secondary | ICD-10-CM | POA: Diagnosis not present

## 2024-01-30 DIAGNOSIS — Z1231 Encounter for screening mammogram for malignant neoplasm of breast: Secondary | ICD-10-CM | POA: Diagnosis not present

## 2024-01-30 DIAGNOSIS — M76821 Posterior tibial tendinitis, right leg: Secondary | ICD-10-CM | POA: Diagnosis not present

## 2024-01-30 DIAGNOSIS — M25671 Stiffness of right ankle, not elsewhere classified: Secondary | ICD-10-CM | POA: Diagnosis not present

## 2024-01-30 DIAGNOSIS — M25571 Pain in right ankle and joints of right foot: Secondary | ICD-10-CM | POA: Diagnosis not present

## 2024-02-01 DIAGNOSIS — M7661 Achilles tendinitis, right leg: Secondary | ICD-10-CM | POA: Diagnosis not present

## 2024-02-01 DIAGNOSIS — M25671 Stiffness of right ankle, not elsewhere classified: Secondary | ICD-10-CM | POA: Diagnosis not present

## 2024-02-01 DIAGNOSIS — M25571 Pain in right ankle and joints of right foot: Secondary | ICD-10-CM | POA: Diagnosis not present

## 2024-02-01 DIAGNOSIS — M76821 Posterior tibial tendinitis, right leg: Secondary | ICD-10-CM | POA: Diagnosis not present

## 2024-02-06 DIAGNOSIS — M25671 Stiffness of right ankle, not elsewhere classified: Secondary | ICD-10-CM | POA: Diagnosis not present

## 2024-02-06 DIAGNOSIS — M76821 Posterior tibial tendinitis, right leg: Secondary | ICD-10-CM | POA: Diagnosis not present

## 2024-02-06 DIAGNOSIS — M7661 Achilles tendinitis, right leg: Secondary | ICD-10-CM | POA: Diagnosis not present

## 2024-02-06 DIAGNOSIS — M25571 Pain in right ankle and joints of right foot: Secondary | ICD-10-CM | POA: Diagnosis not present

## 2024-02-08 DIAGNOSIS — M7661 Achilles tendinitis, right leg: Secondary | ICD-10-CM | POA: Diagnosis not present

## 2024-02-08 DIAGNOSIS — M76821 Posterior tibial tendinitis, right leg: Secondary | ICD-10-CM | POA: Diagnosis not present

## 2024-02-08 DIAGNOSIS — M25671 Stiffness of right ankle, not elsewhere classified: Secondary | ICD-10-CM | POA: Diagnosis not present

## 2024-02-08 DIAGNOSIS — M25571 Pain in right ankle and joints of right foot: Secondary | ICD-10-CM | POA: Diagnosis not present

## 2024-02-13 ENCOUNTER — Ambulatory Visit (INDEPENDENT_AMBULATORY_CARE_PROVIDER_SITE_OTHER): Payer: HMO | Admitting: Internal Medicine

## 2024-02-15 ENCOUNTER — Encounter (INDEPENDENT_AMBULATORY_CARE_PROVIDER_SITE_OTHER): Payer: Self-pay | Admitting: Internal Medicine

## 2024-02-15 ENCOUNTER — Ambulatory Visit (INDEPENDENT_AMBULATORY_CARE_PROVIDER_SITE_OTHER): Admitting: Internal Medicine

## 2024-02-15 VITALS — BP 125/78 | HR 87 | Temp 97.6°F | Ht 67.0 in | Wt 227.0 lb

## 2024-02-15 DIAGNOSIS — Z6836 Body mass index (BMI) 36.0-36.9, adult: Secondary | ICD-10-CM | POA: Diagnosis not present

## 2024-02-15 DIAGNOSIS — M25571 Pain in right ankle and joints of right foot: Secondary | ICD-10-CM | POA: Diagnosis not present

## 2024-02-15 DIAGNOSIS — M25671 Stiffness of right ankle, not elsewhere classified: Secondary | ICD-10-CM | POA: Diagnosis not present

## 2024-02-15 DIAGNOSIS — E7800 Pure hypercholesterolemia, unspecified: Secondary | ICD-10-CM

## 2024-02-15 DIAGNOSIS — M7661 Achilles tendinitis, right leg: Secondary | ICD-10-CM | POA: Diagnosis not present

## 2024-02-15 DIAGNOSIS — R7303 Prediabetes: Secondary | ICD-10-CM

## 2024-02-15 DIAGNOSIS — E66812 Obesity, class 2: Secondary | ICD-10-CM | POA: Diagnosis not present

## 2024-02-15 DIAGNOSIS — M76821 Posterior tibial tendinitis, right leg: Secondary | ICD-10-CM | POA: Diagnosis not present

## 2024-02-15 NOTE — Progress Notes (Signed)
 Office: [phone]  /  Fax: [phone]  Weight Summary And Biometrics  Vitals Temp: 97.6 F (36.4 C) BP: 125/78 Pulse Rate: 87 SpO2: 97 %   Anthropometric Measurements Height: 5\' 7"  (1.702 m) Weight: 227 lb (103 kg) BMI (Calculated): 35.55 Weight at Last Visit: 232 lb Weight Lost Since Last Visit: 5 lb Weight Gained Since Last Visit: 0 lb Starting Weight: 234 lb Total Weight Loss (lbs): 7 lb (3.175 kg) Peak Weight: 237 lb   Body Composition  Body Fat %: 44.6 % Fat Mass (lbs): 101.6 lbs Muscle Mass (lbs): 119.6 lbs Total Body Water (lbs): 87.4 lbs Visceral Fat Rating : 13    RMR: 1770  Today's Visit #: 3  Starting Date: 12/13/23   Subjective   Chief Complaint: Obesity  Interval History Discussed the use of AI scribe software for clinical note transcription with the patient, who gave verbal consent to proceed.  History of Present Illness [name] is a 58 year old female with obesity, prediabetes, and hypercholesterolemia who presents for medical weight management.  She is actively engaged in a medical weight management program and has recently lost five pounds. This weight loss is attributed to increased physical activity, including strength training and walking, as well as dietary changes such as reducing nighttime snacking and focusing on protein intake. She is motivated to continue losing weight and is working on improving consistency in her exercise routine.  She has prediabetes with a previous A1c of 6.0 and insulin levels of 12.7. She is not currently on any medication for this condition and is focusing on lifestyle modifications to manage her blood sugar levels.  Her hypercholesterolemia is characterized by a total cholesterol of 298 and an LDL of 202, which is suspected to be familial. She plans to recheck her lipid levels in three months.     Challenges affecting patient progress: none.    Pharmacotherapy for weight management: She is  currently taking patient has declined pharmacotherapy in past.   Assessment and Plan   Treatment Plan For Obesity:  Recommended Dietary Goals  [name] is currently in the action stage of change. As such, her goal is to continue weight management plan. She has agreed to: follow the Category 2 plan - 1200 kcal per day  Behavioral Health and Counseling  We discussed the following behavioral modification strategies today: continue to work on maintaining a reduced calorie state, getting the recommended amount of protein, incorporating whole foods, making healthy choices, staying well hydrated and practicing mindfulness when eating..  Additional education and resources provided today: None  Recommended Physical Activity Goals  Kristin has been advised to work up to 150 minutes of moderate intensity aerobic activity a week and strengthening exercises 2-3 times per week for cardiovascular health, weight loss maintenance and preservation of muscle mass.   She has agreed to :  continue to gradually increase the amount and intensity of exercise routine  Pharmacotherapy  We discussed various medication options to help [name] with her weight loss efforts and we both agreed to : has declined pharmacotherapy  Associated Conditions Impacted by Obesity Treatment  Prediabetes  Pure hypercholesterolemia  Class 2 severe obesity with serious comorbidity and body mass index (BMI) of 36.0 to 36.9 in adult, unspecified obesity type (HCC)    Assessment and Plan Assessment & Plan Obesity She is affected by obesity and is actively engaged in medical weight management, having lost five pounds since the last visit. She incorporates strength training, walking, and dietary changes,  including increased protein intake and reduced nighttime snacking. She is motivated to continue these lifestyle modifications for further weight loss. - Continue current lifestyle modifications including strength training, walking, and  dietary changes - Encourage consistency in physical activity and meal preparation - Reassess weight and lifestyle progress in 4 weeks  Prediabetes She has prediabetes with an A1c of 6.0 and insulin resistance. Metformin was discussed as a prophylactic option to reduce diabetes risk by approximately 30%, and assist with weight management.  She prefers to focus on lifestyle modifications first. Berberine, a supplement similar to metformin, was also discussed as an alternative. - Reassess A1c and insulin levels in 3 months  Hypercholesterolemia She has hypercholesterolemia with a total cholesterol of 298 mg/dL and an LDL of 952 mg/dL, likely familial. Statin therapy and a coronary artery calcium score were discussed to assess cardiovascular risk, but she prefers to try lifestyle modifications first and recheck lipid levels. - Reassess lipid profile in 3 months - Consider statin therapy and coronary artery calcium score if lifestyle modifications are insufficient      Objective   Physical Exam:  Blood pressure 125/78, pulse 87, temperature 97.6 F (36.4 C), height 5\' 7"  (1.702 m), weight 227 lb (103 kg), last menstrual period 11/29/2015, SpO2 97%. Body mass index is 35.55 kg/m.  General: She is overweight, cooperative, alert, well developed, and in no acute distress. PSYCH: Has normal mood, affect and thought process.   HEENT: EOMI, sclerae are anicteric. Lungs: Normal breathing effort, no conversational dyspnea. Extremities: No edema.  Neurologic: No gross sensory or motor deficits. No tremors or fasciculations noted.    Diagnostic Data Reviewed:  BMET    Component Value Date/Time   NA 139 12/13/2023 1218   K 4.5 12/13/2023 1218   CL 102 12/13/2023 1218   CO2 24 12/13/2023 1218   GLUCOSE 91 12/13/2023 1218   GLUCOSE 99 03/29/2023 0841   BUN 14 12/13/2023 1218   CREATININE 0.81 12/13/2023 1218   CREATININE 0.84 08/28/2020 1508   CALCIUM 9.8 12/13/2023 1218   GFRNONAA 84 (L)  10/23/2011 1637   GFRAA >90 10/23/2011 1637   Lab Results  Component Value Date   HGBA1C 6.0 10/16/2023   HGBA1C 6.1 (H) 06/01/2011   Lab Results  Component Value Date   INSULIN 12.7 12/13/2023   Lab Results  Component Value Date   TSH 1.080 12/13/2023   CBC    Component Value Date/Time   WBC 5.0 12/13/2023 1218   WBC 4.9 04/06/2022 1057   RBC 4.73 12/13/2023 1218   RBC 4.71 04/06/2022 1057   HGB 14.0 12/13/2023 1218   HCT 43.3 12/13/2023 1218   PLT 273 12/13/2023 1218   MCV 92 12/13/2023 1218   MCH 29.6 12/13/2023 1218   MCH 30.8 08/28/2020 1508   MCHC 32.3 12/13/2023 1218   MCHC 32.8 04/06/2022 1057   RDW 13.1 12/13/2023 1218   Iron Studies    Component Value Date/Time   IRON 97 12/13/2023 1218   TIBC 307 12/13/2023 1218   FERRITIN 81 12/13/2023 1218   IRONPCTSAT 32 12/13/2023 1218   Lipid Panel     Component Value Date/Time   CHOL 298 (H) 12/13/2023 1218   TRIG 190 (H) 12/13/2023 1218   HDL 60 12/13/2023 1218   CHOLHDL 4 03/29/2023 0841   VLDL 28.2 03/29/2023 0841   LDLCALC 202 (H) 12/13/2023 1218   LDLCALC 159 (H) 08/28/2020 1508   Hepatic Function Panel     Component Value Date/Time  PROT 7.0 12/13/2023 1218   ALBUMIN 4.5 12/13/2023 1218   AST 16 12/13/2023 1218   ALT 22 12/13/2023 1218   ALKPHOS 87 12/13/2023 1218   BILITOT 0.4 12/13/2023 1218      Component Value Date/Time   TSH 1.080 12/13/2023 1218   Nutritional Lab Results  Component Value Date   VD25OH 20.81 (L) 10/16/2023   VD25OH 22.22 (L) 03/29/2023   VD25OH 21.79 (L) 04/06/2022    Medications: Outpatient Encounter Medications as of 02/15/2024  Medication Sig   lamoTRIgine (LAMICTAL) 25 MG tablet TAKE 2 TABLETS BY MOUTH EVERY DAY   vitamin B-12 (CYANOCOBALAMIN) 100 MCG tablet Take 1 tablet (100 mcg total) by mouth daily.   Vitamin D, Ergocalciferol, (DRISDOL) 1.25 MG (50000 UNIT) CAPS capsule Take 1 capsule (50,000 Units total) by mouth every 7 (seven) days.   No  facility-administered encounter medications on file as of 02/15/2024.     Follow-Up   No follow-ups on file.[name] She was informed of the importance of frequent follow up visits to maximize her success with intensive lifestyle modifications for her multiple health conditions.  Attestation Statement   Reviewed by clinician on day of visit: allergies, medications, problem list, medical history, surgical history, family history, social history, and previous encounter notes.     Worthy Rancher, MD

## 2024-02-22 DIAGNOSIS — M7661 Achilles tendinitis, right leg: Secondary | ICD-10-CM | POA: Diagnosis not present

## 2024-02-22 DIAGNOSIS — M25571 Pain in right ankle and joints of right foot: Secondary | ICD-10-CM | POA: Diagnosis not present

## 2024-02-22 DIAGNOSIS — M76821 Posterior tibial tendinitis, right leg: Secondary | ICD-10-CM | POA: Diagnosis not present

## 2024-02-22 DIAGNOSIS — M25671 Stiffness of right ankle, not elsewhere classified: Secondary | ICD-10-CM | POA: Diagnosis not present

## 2024-02-29 DIAGNOSIS — M25571 Pain in right ankle and joints of right foot: Secondary | ICD-10-CM | POA: Diagnosis not present

## 2024-02-29 DIAGNOSIS — M7661 Achilles tendinitis, right leg: Secondary | ICD-10-CM | POA: Diagnosis not present

## 2024-02-29 DIAGNOSIS — M76821 Posterior tibial tendinitis, right leg: Secondary | ICD-10-CM | POA: Diagnosis not present

## 2024-02-29 DIAGNOSIS — M25671 Stiffness of right ankle, not elsewhere classified: Secondary | ICD-10-CM | POA: Diagnosis not present

## 2024-03-07 DIAGNOSIS — M76821 Posterior tibial tendinitis, right leg: Secondary | ICD-10-CM | POA: Diagnosis not present

## 2024-03-07 DIAGNOSIS — M25671 Stiffness of right ankle, not elsewhere classified: Secondary | ICD-10-CM | POA: Diagnosis not present

## 2024-03-07 DIAGNOSIS — M7661 Achilles tendinitis, right leg: Secondary | ICD-10-CM | POA: Diagnosis not present

## 2024-03-07 DIAGNOSIS — M25571 Pain in right ankle and joints of right foot: Secondary | ICD-10-CM | POA: Diagnosis not present

## 2024-03-19 ENCOUNTER — Ambulatory Visit: Admitting: Family Medicine

## 2024-03-19 ENCOUNTER — Ambulatory Visit (INDEPENDENT_AMBULATORY_CARE_PROVIDER_SITE_OTHER): Admitting: Internal Medicine

## 2024-03-21 DIAGNOSIS — M25571 Pain in right ankle and joints of right foot: Secondary | ICD-10-CM | POA: Diagnosis not present

## 2024-03-21 DIAGNOSIS — M25671 Stiffness of right ankle, not elsewhere classified: Secondary | ICD-10-CM | POA: Diagnosis not present

## 2024-03-21 DIAGNOSIS — M7661 Achilles tendinitis, right leg: Secondary | ICD-10-CM | POA: Diagnosis not present

## 2024-03-21 DIAGNOSIS — M76821 Posterior tibial tendinitis, right leg: Secondary | ICD-10-CM | POA: Diagnosis not present

## 2024-03-24 DIAGNOSIS — Z1211 Encounter for screening for malignant neoplasm of colon: Secondary | ICD-10-CM | POA: Diagnosis not present

## 2024-03-26 ENCOUNTER — Ambulatory Visit: Admitting: Family Medicine

## 2024-03-26 DIAGNOSIS — M7661 Achilles tendinitis, right leg: Secondary | ICD-10-CM | POA: Diagnosis not present

## 2024-03-26 DIAGNOSIS — M25571 Pain in right ankle and joints of right foot: Secondary | ICD-10-CM | POA: Diagnosis not present

## 2024-03-26 DIAGNOSIS — M25671 Stiffness of right ankle, not elsewhere classified: Secondary | ICD-10-CM | POA: Diagnosis not present

## 2024-03-26 DIAGNOSIS — M76821 Posterior tibial tendinitis, right leg: Secondary | ICD-10-CM | POA: Diagnosis not present

## 2024-03-28 DIAGNOSIS — M25571 Pain in right ankle and joints of right foot: Secondary | ICD-10-CM | POA: Diagnosis not present

## 2024-03-28 DIAGNOSIS — M25671 Stiffness of right ankle, not elsewhere classified: Secondary | ICD-10-CM | POA: Diagnosis not present

## 2024-03-28 DIAGNOSIS — M76821 Posterior tibial tendinitis, right leg: Secondary | ICD-10-CM | POA: Diagnosis not present

## 2024-03-28 DIAGNOSIS — M7661 Achilles tendinitis, right leg: Secondary | ICD-10-CM | POA: Diagnosis not present

## 2024-03-29 LAB — COLOGUARD: COLOGUARD: NEGATIVE

## 2024-03-29 LAB — EXTERNAL GENERIC LAB PROCEDURE: COLOGUARD: NEGATIVE

## 2024-04-01 ENCOUNTER — Encounter: Payer: Self-pay | Admitting: Family Medicine

## 2024-04-01 ENCOUNTER — Ambulatory Visit (INDEPENDENT_AMBULATORY_CARE_PROVIDER_SITE_OTHER): Payer: HMO | Admitting: Family Medicine

## 2024-04-01 VITALS — BP 118/80 | HR 72 | Temp 98.1°F | Ht 66.0 in | Wt 228.9 lb

## 2024-04-01 DIAGNOSIS — E538 Deficiency of other specified B group vitamins: Secondary | ICD-10-CM | POA: Diagnosis not present

## 2024-04-01 DIAGNOSIS — Z0000 Encounter for general adult medical examination without abnormal findings: Secondary | ICD-10-CM | POA: Diagnosis not present

## 2024-04-01 DIAGNOSIS — E559 Vitamin D deficiency, unspecified: Secondary | ICD-10-CM

## 2024-04-01 DIAGNOSIS — F319 Bipolar disorder, unspecified: Secondary | ICD-10-CM

## 2024-04-01 DIAGNOSIS — E7800 Pure hypercholesterolemia, unspecified: Secondary | ICD-10-CM | POA: Diagnosis not present

## 2024-04-01 LAB — VITAMIN B12: Vitamin B-12: 378 pg/mL (ref 211–911)

## 2024-04-01 LAB — VITAMIN D 25 HYDROXY (VIT D DEFICIENCY, FRACTURES): VITD: 32.09 ng/mL (ref 30.00–100.00)

## 2024-04-01 MED ORDER — LAMOTRIGINE 25 MG PO TABS: 25.0000 mg | ORAL_TABLET | Freq: Every day | ORAL | Status: DC

## 2024-04-01 MED ORDER — ROSUVASTATIN CALCIUM 10 MG PO TABS: 10.0000 mg | ORAL_TABLET | Freq: Every day | ORAL | 1 refills | Status: DC

## 2024-04-01 NOTE — Patient Instructions (Signed)

## 2024-04-01 NOTE — Progress Notes (Signed)
 Complete physical exam  Patient: [name]   DOB: 14-Jan-1966   58 y.o. Female  MRN: [id number]  Subjective:    Chief Complaint  Patient presents with   Annual Exam    [name] is a 58 y.o. female who presents today for a complete physical exam. She reports consuming a general diet. Home exercise routine includes walking 2 hrs per week. She generally feels well. She reports sleeping fairly well. She does not have additional problems to discuss today.    Most recent fall risk assessment:    04/01/2024    7:55 AM  Fall Risk   Falls in the past year? 0  Number falls in past yr: 0  Risk for fall due to : History of fall(s)  Follow up Falls evaluation completed     Most recent depression screenings:    04/01/2024    7:56 AM 10/16/2023    4:30 PM  PHQ 2/9 Scores  PHQ - 2 Score 0 0  PHQ- 9 Score 0 0    Vision:Within last year and Dental: No current dental problems and Receives regular dental care     Patient Care Team: Aida House, MD as PCP - General (Family Medicine) Reymundo Caulk, MD as Consulting Physician (Psychiatry)   Outpatient Medications Prior to Visit  Medication Sig   lamoTRIgine  (LAMICTAL ) 25 MG tablet TAKE 2 TABLETS BY MOUTH EVERY DAY (Patient taking differently: Take 25 mg by mouth daily.)   vitamin B-12 (CYANOCOBALAMIN ) 100 MCG tablet Take 1 tablet (100 mcg total) by mouth daily.   Vitamin D , Ergocalciferol , (DRISDOL ) 1.25 MG (50000 UNIT) CAPS capsule Take 1 capsule (50,000 Units total) by mouth every 7 (seven) days.   No facility-administered medications prior to visit.    Review of Systems  HENT:  Negative for hearing loss.   Eyes:  Negative for blurred vision.  Respiratory:  Negative for shortness of breath.   Cardiovascular:  Negative for chest pain.  Gastrointestinal: Negative.   Genitourinary: Negative.   Musculoskeletal:  Negative for back pain.  Neurological:  Negative for headaches.  Psychiatric/Behavioral:  Negative for  depression.        Objective:     BP 118/80   Pulse 72   Temp 98.1 F (36.7 C) (Oral)   Ht 5\' 6"  (1.676 m)   Wt 228 lb 14.4 oz (103.8 kg)   LMP 11/29/2015 (Approximate)   SpO2 98%   BMI 36.95 kg/m    Physical Exam Vitals reviewed.  Constitutional:      Appearance: Normal appearance. She is well-groomed. She is obese.  HENT:     Right Ear: Tympanic membrane and ear canal normal.     Left Ear: Tympanic membrane and ear canal normal.     Mouth/Throat:     Mouth: Mucous membranes are moist.     Pharynx: No posterior oropharyngeal erythema.  Eyes:     Conjunctiva/sclera: Conjunctivae normal.  Neck:     Thyroid: No thyromegaly.  Cardiovascular:     Rate and Rhythm: Normal rate and regular rhythm.     Pulses: Normal pulses.     Heart sounds: S1 normal and S2 normal.  Pulmonary:     Effort: Pulmonary effort is normal.     Breath sounds: Normal breath sounds and air entry.  Abdominal:     General: Abdomen is flat. Bowel sounds are normal.     Palpations: Abdomen is soft.  Musculoskeletal:     Right lower leg:  No edema.     Left lower leg: No edema.  Lymphadenopathy:     Cervical: No cervical adenopathy.  Neurological:     Mental Status: She is alert and oriented to person, place, and time. Mental status is at baseline.     Gait: Gait is intact.  Psychiatric:        Mood and Affect: Mood and affect normal.        Speech: Speech normal.        Behavior: Behavior normal.        Judgment: Judgment normal.     No results found for any visits on 04/01/24. Last metabolic panel Lab Results  Component Value Date   GLUCOSE 91 12/13/2023   NA 139 12/13/2023   K 4.5 12/13/2023   CL 102 12/13/2023   CO2 24 12/13/2023   BUN 14 12/13/2023   CREATININE 0.81 12/13/2023   EGFR 84 12/13/2023   CALCIUM 9.8 12/13/2023   PROT 7.0 12/13/2023   ALBUMIN 4.5 12/13/2023   LABGLOB 2.5 12/13/2023   BILITOT 0.4 12/13/2023   ALKPHOS 87 12/13/2023   AST 16 12/13/2023   ALT 22  12/13/2023   Last lipids Lab Results  Component Value Date   CHOL 298 (H) 12/13/2023   HDL 60 12/13/2023   LDLCALC 202 (H) 12/13/2023   TRIG 190 (H) 12/13/2023   CHOLHDL 4 03/29/2023        Assessment & Plan:    Routine Health Maintenance and Physical Exam  Immunization History  Administered Date(s) Administered   Influenza Whole 08/28/2010   Influenza,inj,Quad PF,6+ Mos 10/11/2018   PFIZER(Purple Top)SARS-COV-2 Vaccination 02/29/2020, 03/24/2020   Td 12/31/2018   Zoster Recombinant(Shingrix) 06/01/2023, 08/16/2023    Health Maintenance  Topic Date Due   Medicare Annual Wellness (AWV)  03/10/2023   COVID-19 Vaccine (3 - 2024-25 season) 07/30/2023   HIV Screening  06/20/2026 (Originally 02/07/1981)   INFLUENZA VACCINE  06/28/2024   MAMMOGRAM  01/12/2025   Fecal DNA (Cologuard)  03/25/2027   Cervical Cancer Screening (HPV/Pap Cotest)  11/16/2028   DTaP/Tdap/Td (2 - Tdap) 12/31/2028   Hepatitis C Screening  Completed   Zoster Vaccines- Shingrix  Completed   HPV VACCINES  Aged Out   Meningococcal B Vaccine  Aged Out   Colonoscopy  Discontinued    Discussed health benefits of physical activity, and encouraged her to engage in regular exercise appropriate for her age and condition.  Routine general medical examination at a health care facility      Normal physical exam findings today, I counseled the patient on high cholesterol and her risk of CAD, we discussed using statin medication to help lower her cholesterol. I gave patient handouts on healthy eating and exercise. Reviewed health  maintenance and she is UTD.   Pure hypercholesterolemia -     Rosuvastatin Calcium; Take 1 tablet (10 mg total) by mouth daily.  Dispense: 90 tablet; Refill: 1  Vitamin D  deficiency -     VITAMIN D  25 Hydroxy (Vit-D Deficiency, Fractures); Future  B12 deficiency -     Vitamin B12; Future    Return in 6 months (on 10/02/2024).     Aida House, MD

## 2024-04-02 MED ORDER — VITAMIN D3 25 MCG (1000 UT) PO CAPS: 1000.0000 [IU] | ORAL_CAPSULE | Freq: Every day | ORAL | 1 refills | Status: DC

## 2024-04-02 MED ORDER — VITAMIN B-12 100 MCG PO TABS: 100.0000 ug | ORAL_TABLET | Freq: Every day | ORAL | 1 refills | Status: DC

## 2024-04-04 DIAGNOSIS — M25571 Pain in right ankle and joints of right foot: Secondary | ICD-10-CM | POA: Diagnosis not present

## 2024-04-04 DIAGNOSIS — M7661 Achilles tendinitis, right leg: Secondary | ICD-10-CM | POA: Diagnosis not present

## 2024-04-04 DIAGNOSIS — M76821 Posterior tibial tendinitis, right leg: Secondary | ICD-10-CM | POA: Diagnosis not present

## 2024-04-04 DIAGNOSIS — M25671 Stiffness of right ankle, not elsewhere classified: Secondary | ICD-10-CM | POA: Diagnosis not present

## 2024-04-16 DIAGNOSIS — M25571 Pain in right ankle and joints of right foot: Secondary | ICD-10-CM | POA: Diagnosis not present

## 2024-04-16 DIAGNOSIS — M76821 Posterior tibial tendinitis, right leg: Secondary | ICD-10-CM | POA: Diagnosis not present

## 2024-04-16 DIAGNOSIS — M7661 Achilles tendinitis, right leg: Secondary | ICD-10-CM | POA: Diagnosis not present

## 2024-04-16 DIAGNOSIS — M25671 Stiffness of right ankle, not elsewhere classified: Secondary | ICD-10-CM | POA: Diagnosis not present

## 2024-04-17 ENCOUNTER — Other Ambulatory Visit: Payer: Self-pay | Admitting: Family Medicine

## 2024-04-17 DIAGNOSIS — E559 Vitamin D deficiency, unspecified: Secondary | ICD-10-CM

## 2024-04-18 DIAGNOSIS — M25571 Pain in right ankle and joints of right foot: Secondary | ICD-10-CM | POA: Diagnosis not present

## 2024-04-18 DIAGNOSIS — M7661 Achilles tendinitis, right leg: Secondary | ICD-10-CM | POA: Diagnosis not present

## 2024-04-18 DIAGNOSIS — M76821 Posterior tibial tendinitis, right leg: Secondary | ICD-10-CM | POA: Diagnosis not present

## 2024-04-18 DIAGNOSIS — M25671 Stiffness of right ankle, not elsewhere classified: Secondary | ICD-10-CM | POA: Diagnosis not present

## 2024-04-23 DIAGNOSIS — M76821 Posterior tibial tendinitis, right leg: Secondary | ICD-10-CM | POA: Diagnosis not present

## 2024-04-23 DIAGNOSIS — M7661 Achilles tendinitis, right leg: Secondary | ICD-10-CM | POA: Diagnosis not present

## 2024-04-23 DIAGNOSIS — M25571 Pain in right ankle and joints of right foot: Secondary | ICD-10-CM | POA: Diagnosis not present

## 2024-04-23 DIAGNOSIS — M25671 Stiffness of right ankle, not elsewhere classified: Secondary | ICD-10-CM | POA: Diagnosis not present

## 2024-04-30 ENCOUNTER — Encounter (INDEPENDENT_AMBULATORY_CARE_PROVIDER_SITE_OTHER): Payer: Self-pay | Admitting: Internal Medicine

## 2024-04-30 ENCOUNTER — Ambulatory Visit (INDEPENDENT_AMBULATORY_CARE_PROVIDER_SITE_OTHER): Admitting: Internal Medicine

## 2024-04-30 VITALS — BP 122/84 | HR 65 | Temp 97.6°F | Ht 66.0 in | Wt 226.0 lb

## 2024-04-30 DIAGNOSIS — E66812 Obesity, class 2: Secondary | ICD-10-CM

## 2024-04-30 DIAGNOSIS — M7661 Achilles tendinitis, right leg: Secondary | ICD-10-CM | POA: Diagnosis not present

## 2024-04-30 DIAGNOSIS — Z6836 Body mass index (BMI) 36.0-36.9, adult: Secondary | ICD-10-CM

## 2024-04-30 DIAGNOSIS — E7800 Pure hypercholesterolemia, unspecified: Secondary | ICD-10-CM | POA: Diagnosis not present

## 2024-04-30 DIAGNOSIS — E559 Vitamin D deficiency, unspecified: Secondary | ICD-10-CM

## 2024-04-30 DIAGNOSIS — M25671 Stiffness of right ankle, not elsewhere classified: Secondary | ICD-10-CM | POA: Diagnosis not present

## 2024-04-30 DIAGNOSIS — R7303 Prediabetes: Secondary | ICD-10-CM

## 2024-04-30 DIAGNOSIS — M25571 Pain in right ankle and joints of right foot: Secondary | ICD-10-CM | POA: Diagnosis not present

## 2024-04-30 DIAGNOSIS — M76821 Posterior tibial tendinitis, right leg: Secondary | ICD-10-CM | POA: Diagnosis not present

## 2024-04-30 MED ORDER — METFORMIN HCL ER 500 MG PO TB24: 500.0000 mg | ORAL_TABLET | Freq: Every day | ORAL | 0 refills | Status: DC

## 2024-04-30 MED ORDER — MULTI-VITAMIN/MINERALS PO TABS
1.0000 | ORAL_TABLET | Freq: Every day | ORAL | 1 refills | Status: AC
Start: 2024-04-30 — End: ?

## 2024-04-30 NOTE — Progress Notes (Signed)
 Office: [phone]  /  Fax: [phone]  Weight Summary And Biometrics  Vitals Temp: 97.6 F (36.4 C) BP: 122/84 Pulse Rate: 65 SpO2: 98 %   Anthropometric Measurements Height: 5\' 6"  (1.676 m) Weight: 226 lb (102.5 kg) BMI (Calculated): 36.49 Weight at Last Visit: 228 lb Weight Lost Since Last Visit: 2 lb Weight Gained Since Last Visit: 0 lb Starting Weight: 234 lb Total Weight Loss (lbs): 8 lb (3.629 kg) Peak Weight: 237 lb   Body Composition  Body Fat %: 46.1 % Fat Mass (lbs): 104.2 lbs Muscle Mass (lbs): 115.6 lbs Total Body Water (lbs): 86.8 lbs Visceral Fat Rating : 14    RMR: 1770  Today's Visit #: 4  Starting Date: 12/13/23   Subjective   Chief Complaint: Obesity  Interval History Discussed the use of AI scribe software for clinical note transcription with the patient, who gave verbal consent to proceed.  History of Present Illness   [name] is a 58 year old female who presents for medical weight management.  Since the last visit, she has lost two pounds. She follows a 1200 calorie meal plan 30-40% of the time and is not tracking calories. She is not getting the recommended amount of protein but maintains adequate hydration and does not skip meals. She exercises one day a week for about 20 minutes, mostly doing cardio. She reports adequate sleep but notes increased levels of stress.  She has been on cholesterol medication prescribed by her other PCP, which she takes in the morning. She has been on it for about a month and is not consistent with taking it daily. She has had high cholesterol since 2020 or 2021 and believes it might be genetic.  She is prediabetic with an A1c of 6.0.  She is taking vitamin D  and B12 supplements. Her vitamin D  was switched to 1000 mg once a day, but the pharmacy filled the high dose instead. She is also taking Lamictal , but there is an issue with the pharmacy providing the correct dosage.       Challenges  affecting patient progress: none, low volume of physical activity at present , menopause, and recent lapse in weight management plan due to work, travel or family circumstances.    Pharmacotherapy for weight management: She is currently taking no anti-obesity medication.   Assessment and Plan   Treatment Plan For Obesity:  Recommended Dietary Goals  [name] is currently in the action stage of change. As such, her goal is to continue weight management plan. She has agreed to: continue current plan and continue to work on implementation of reduced calorie nutrition plan (RCNP)  Behavioral Health and Counseling  We discussed the following behavioral modification strategies today: continue to work on maintaining a reduced calorie state, getting the recommended amount of protein, incorporating whole foods, making healthy choices, staying well hydrated and practicing mindfulness when eating..  Additional education and resources provided today: None  Recommended Physical Activity Goals  [name] has been advised to work up to 150 minutes of moderate intensity aerobic activity a week and strengthening exercises 2-3 times per week for cardiovascular health, weight loss maintenance and preservation of muscle mass.   She has agreed to :  Think about enjoyable ways to increase daily physical activity and overcoming barriers to exercise and Increase physical activity in their day and reduce sedentary time (increase NEAT).  Pharmacotherapy  We discussed various medication options to help [name] with her weight loss efforts and we both agreed  to : She will be started on metformin XR 500 mg 1 tablet daily for diabetes prevention and weight management  Associated Conditions Impacted by Obesity Treatment  Class 2 severe obesity with serious comorbidity and body mass index (BMI) of 36.0 to 36.9 in adult, unspecified obesity type (HCC)  Prediabetes  Pure hypercholesterolemia     Assessment and Plan     Obesity She is undergoing medical weight management and has lost two pounds since the last visit. She follows a 1200 calorie meal plan 30-40% of the time, is not tracking calories, and is not getting the recommended amount of protein. She exercises once a week for 20 minutes, mostly cardio, and maintains adequate hydration and sleep. Reports increased stress levels. Plans to increase physical activity and improve protein intake. Discussed the importance of consistency in lifestyle changes and the use of multivitamins to ensure adequate nutrient intake during calorie restriction. - Encourage adherence to a 1200 calorie meal plan - Increase physical activity, aim for daily walking - Improve protein intake - Consider stress management techniques - Take a multivitamin with minerals  Prediabetes Her A1c is 6.0, indicating prediabetes. Discussed the importance of preventing progression to diabetes and the benefits of metformin in weight management, cholesterol improvement, and diabetes prevention. Explained that metformin is a safe and cost-effective medication that aids in diabetes prevention and cardiovascular health. - Start metformin, one tablet daily with food - Monitor blood glucose levels  Hyperlipidemia She has high cholesterol and is currently on rosuvastatin . She has been inconsistent with taking the medication, initially taking it at night but plans to switch to morning dosing for better adherence. The goal is to reduce LDL cholesterol to 70 to lower the risk of myocardial infarction and stroke. Discussed the importance of statins in reducing plaque buildup and cardiovascular risk. Explained that lifestyle changes alone may reduce cholesterol  but medication is necessary to achieve optimal levels. - Continue rosuvastatin , switch to morning dosing - Recheck cholesterol levels in one month  Vitamin D  Deficiency She was previously on high-dose vitamin D  but was supposed to switch to a daily  1000 IU dose. There was a pharmacy error, and she received the high dose again. Advised completing the high-dose course and then transitioning to a daily dose. Recommended taking a multivitamin with minerals to ensure adequate nutrient intake during calorie restriction. - Finish current high-dose vitamin D  course - Start daily vitamin D  1000 IU or 2000 IU after completing high-dose - Take a multivitamin with minerals         Objective   Physical Exam:  Blood pressure 122/84, pulse 65, temperature 97.6 F (36.4 C), height 5\' 6"  (1.676 m), weight 226 lb (102.5 kg), last menstrual period 11/29/2015, SpO2 98%. Body mass index is 36.48 kg/m.  General: She is overweight, cooperative, alert, well developed, and in no acute distress. PSYCH: Has normal mood, affect and thought process.   HEENT: EOMI, sclerae are anicteric. Lungs: Normal breathing effort, no conversational dyspnea. Extremities: No edema.  Neurologic: No gross sensory or motor deficits. No tremors or fasciculations noted.    Diagnostic Data Reviewed:  BMET    Component Value Date/Time   NA 139 12/13/2023 1218   K 4.5 12/13/2023 1218   CL 102 12/13/2023 1218   CO2 24 12/13/2023 1218   GLUCOSE 91 12/13/2023 1218   GLUCOSE 99 03/29/2023 0841   BUN 14 12/13/2023 1218   CREATININE 0.81 12/13/2023 1218   CREATININE 0.84 08/28/2020 1508   CALCIUM  9.8 12/13/2023  1218   GFRNONAA 84 (L) 10/23/2011 1637   GFRAA >90 10/23/2011 1637   Lab Results  Component Value Date   HGBA1C 6.0 10/16/2023   HGBA1C 6.1 (H) 06/01/2011   Lab Results  Component Value Date   INSULIN  12.7 12/13/2023   Lab Results  Component Value Date   TSH 1.080 12/13/2023   CBC    Component Value Date/Time   WBC 5.0 12/13/2023 1218   WBC 4.9 04/06/2022 1057   RBC 4.73 12/13/2023 1218   RBC 4.71 04/06/2022 1057   HGB 14.0 12/13/2023 1218   HCT 43.3 12/13/2023 1218   PLT 273 12/13/2023 1218   MCV 92 12/13/2023 1218   MCH 29.6 12/13/2023 1218    MCH 30.8 08/28/2020 1508   MCHC 32.3 12/13/2023 1218   MCHC 32.8 04/06/2022 1057   RDW 13.1 12/13/2023 1218   Iron Studies    Component Value Date/Time   IRON 97 12/13/2023 1218   TIBC 307 12/13/2023 1218   FERRITIN 81 12/13/2023 1218   IRONPCTSAT 32 12/13/2023 1218   Lipid Panel     Component Value Date/Time   CHOL 298 (H) 12/13/2023 1218   TRIG 190 (H) 12/13/2023 1218   HDL 60 12/13/2023 1218   CHOLHDL 4 03/29/2023 0841   VLDL 28.2 03/29/2023 0841   LDLCALC 202 (H) 12/13/2023 1218   LDLCALC 159 (H) 08/28/2020 1508   Hepatic Function Panel     Component Value Date/Time   PROT 7.0 12/13/2023 1218   ALBUMIN 4.5 12/13/2023 1218   AST 16 12/13/2023 1218   ALT 22 12/13/2023 1218   ALKPHOS 87 12/13/2023 1218   BILITOT 0.4 12/13/2023 1218      Component Value Date/Time   TSH 1.080 12/13/2023 1218   Nutritional Lab Results  Component Value Date   VD25OH 32.09 04/01/2024   VD25OH 20.81 (L) 10/16/2023   VD25OH 22.22 (L) 03/29/2023    Medications: Outpatient Encounter Medications as of 04/30/2024  Medication Sig   Cholecalciferol (VITAMIN D3) 25 MCG (1000 UT) CAPS Take 1 capsule (1,000 Units total) by mouth daily.   lamoTRIgine  (LAMICTAL ) 25 MG tablet Take 1 tablet (25 mg total) by mouth daily.   rosuvastatin  (CRESTOR ) 10 MG tablet Take 1 tablet (10 mg total) by mouth daily.   Vitamin D , Ergocalciferol , (DRISDOL ) 1.25 MG (50000 UNIT) CAPS capsule TAKE 1 CAPSULE (50,000 UNITS TOTAL) BY MOUTH EVERY 7 (SEVEN) DAYS   [DISCONTINUED] vitamin B-12 (CYANOCOBALAMIN ) 100 MCG tablet Take 1 tablet (100 mcg total) by mouth daily.   No facility-administered encounter medications on file as of 04/30/2024.     Follow-Up   No follow-ups on file.Aaron Aas She was informed of the importance of frequent follow up visits to maximize her success with intensive lifestyle modifications for her multiple health conditions.  Attestation Statement   Reviewed by clinician on day of visit: allergies,  medications, problem list, medical history, surgical history, family history, social history, and previous encounter notes.     Ladd Picker, MD

## 2024-05-02 DIAGNOSIS — M25671 Stiffness of right ankle, not elsewhere classified: Secondary | ICD-10-CM | POA: Diagnosis not present

## 2024-05-02 DIAGNOSIS — M76821 Posterior tibial tendinitis, right leg: Secondary | ICD-10-CM | POA: Diagnosis not present

## 2024-05-02 DIAGNOSIS — M7661 Achilles tendinitis, right leg: Secondary | ICD-10-CM | POA: Diagnosis not present

## 2024-05-02 DIAGNOSIS — M25571 Pain in right ankle and joints of right foot: Secondary | ICD-10-CM | POA: Diagnosis not present

## 2024-05-07 DIAGNOSIS — M25571 Pain in right ankle and joints of right foot: Secondary | ICD-10-CM | POA: Diagnosis not present

## 2024-05-07 DIAGNOSIS — M25671 Stiffness of right ankle, not elsewhere classified: Secondary | ICD-10-CM | POA: Diagnosis not present

## 2024-05-07 DIAGNOSIS — M7661 Achilles tendinitis, right leg: Secondary | ICD-10-CM | POA: Diagnosis not present

## 2024-05-07 DIAGNOSIS — M76821 Posterior tibial tendinitis, right leg: Secondary | ICD-10-CM | POA: Diagnosis not present

## 2024-05-09 DIAGNOSIS — M25571 Pain in right ankle and joints of right foot: Secondary | ICD-10-CM | POA: Diagnosis not present

## 2024-05-09 DIAGNOSIS — M25671 Stiffness of right ankle, not elsewhere classified: Secondary | ICD-10-CM | POA: Diagnosis not present

## 2024-05-09 DIAGNOSIS — M7661 Achilles tendinitis, right leg: Secondary | ICD-10-CM | POA: Diagnosis not present

## 2024-05-09 DIAGNOSIS — M76821 Posterior tibial tendinitis, right leg: Secondary | ICD-10-CM | POA: Diagnosis not present

## 2024-05-21 DIAGNOSIS — M25571 Pain in right ankle and joints of right foot: Secondary | ICD-10-CM | POA: Diagnosis not present

## 2024-05-21 DIAGNOSIS — M7661 Achilles tendinitis, right leg: Secondary | ICD-10-CM | POA: Diagnosis not present

## 2024-05-21 DIAGNOSIS — M76821 Posterior tibial tendinitis, right leg: Secondary | ICD-10-CM | POA: Diagnosis not present

## 2024-05-21 DIAGNOSIS — M25671 Stiffness of right ankle, not elsewhere classified: Secondary | ICD-10-CM | POA: Diagnosis not present

## 2024-05-23 ENCOUNTER — Other Ambulatory Visit (INDEPENDENT_AMBULATORY_CARE_PROVIDER_SITE_OTHER): Payer: Self-pay | Admitting: Internal Medicine

## 2024-05-23 DIAGNOSIS — R7303 Prediabetes: Secondary | ICD-10-CM

## 2024-06-05 ENCOUNTER — Ambulatory Visit (INDEPENDENT_AMBULATORY_CARE_PROVIDER_SITE_OTHER): Admitting: Internal Medicine

## 2024-06-05 ENCOUNTER — Encounter (INDEPENDENT_AMBULATORY_CARE_PROVIDER_SITE_OTHER): Payer: Self-pay | Admitting: Internal Medicine

## 2024-06-05 VITALS — BP 125/86 | HR 66 | Temp 97.6°F | Ht 66.0 in | Wt 222.0 lb

## 2024-06-05 DIAGNOSIS — E7800 Pure hypercholesterolemia, unspecified: Secondary | ICD-10-CM

## 2024-06-05 DIAGNOSIS — R7303 Prediabetes: Secondary | ICD-10-CM | POA: Diagnosis not present

## 2024-06-05 DIAGNOSIS — E66812 Obesity, class 2: Secondary | ICD-10-CM | POA: Diagnosis not present

## 2024-06-05 DIAGNOSIS — Z6835 Body mass index (BMI) 35.0-35.9, adult: Secondary | ICD-10-CM

## 2024-06-05 MED ORDER — METFORMIN HCL ER 500 MG PO TB24: 500.0000 mg | ORAL_TABLET | Freq: Two times a day (BID) | ORAL | 0 refills | Status: DC

## 2024-06-05 NOTE — Assessment & Plan Note (Signed)
 She has been on metformin  for one month with no side effects. Discussed metformin 's benefits in weight loss and diabetes prevention. Considered increasing metformin  dosage to twice daily, but she prefers to maintain the current dose until blood work is reviewed. Metformin  can take up to three months to affect A1c levels. - Order A1c test to assess current status of prediabetes. - Increase metformin  XR to 500 mg twice a day - Last hemoglobin A1c 6.0

## 2024-06-05 NOTE — Assessment & Plan Note (Signed)
 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  I conveyed to [name] that this may not necessarily be nutritional or associated with obesity and may be more of a problem of how [name] body handles cholesterol.  We discussed reducing saturated fats in diet to less than 10% of calories.  Also reducing simple and added sugars.  Patient has tried lifestyle changes we will check fasting lipid profile today.

## 2024-06-05 NOTE — Assessment & Plan Note (Signed)
 She is adhering to a 1200 calorie meal plan 50-60% of the time and has lost 12 pounds. She is not currently exercising or tracking calories. Discussed the importance of maintaining a calorie deficit for weight loss, the role of exercise, and the benefits of gradual weight loss. Emphasized protein intake for appetite suppression and metabolism boost. Encouraged habit stacking and finding enjoyable physical activities. Discussed meal replacement options with protein bars and shakes to simplify meal planning. - Encourage habit stacking to incorporate exercise when dropping daughter off at activities. - Consider meal replacement with protein bars and shakes for 1-2 meals a day to simplify meal planning. - Encourage consumption of whole foods, lean proteins, and complex carbohydrates. - Provide resources for easy protein-rich recipes and meal planning. - Reinforce the importance of consistency over perfectionism in dietary habits.

## 2024-06-05 NOTE — Progress Notes (Signed)
 Office: [phone]  /  Fax: [phone]  Weight Summary and Body Composition Analysis (BIA)  Vitals Temp: 97.6 F (36.4 C) BP: 125/86 Pulse Rate: 66 SpO2: 97 %   Anthropometric Measurements Height: 5' 6 (1.676 m) Weight: 222 lb (100.7 kg) BMI (Calculated): 35.85 Weight at Last Visit: 226 lb Weight Lost Since Last Visit: 4 lb Weight Gained Since Last Visit: 0 lb Starting Weight: 234 lb Total Weight Loss (lbs): 12 lb (5.443 kg) Peak Weight: 237 lb   Body Composition  Body Fat %: 44.9 % Fat Mass (lbs): 99.8 lbs Muscle Mass (lbs): 116.2 lbs Total Body Water (lbs): 81.2 lbs Visceral Fat Rating : 13    RMR: 1770  Today's Visit #: 5  Starting Date: 12/13/23   Subjective   Chief Complaint: Obesity  Interval History Discussed the use of AI scribe software for clinical note transcription with the patient, who gave verbal consent to proceed.  History of Present Illness   [name] is a 58 year old female with prediabetes who presents for medical weight management.  She follows a twelve hundred calorie meal plan about fifty to sixty percent of the time without tracking calories. She consumes more whole foods and does not skip meals. She is not currently exercising due to hot weather and responsibilities, including caring for her ten-year-old daughter, who is home during the summer and starting volleyball soon.  She has been on metformin  for her prediabetes for about a month, taking 500 mg once daily, and reports no side effects . She notes a possible change in appetite, describing a 'sense of fullness.'  She has lost twelve pounds but feels there is room for improvement in her weight management efforts. She occasionally falls back into old eating patterns but is working on [employer]. Her BMI is currently 35, and her body fat percentage is 44.9%. She is interested in exploring meal replacement options to simplify her diet and ensure adequate  protein intake.       Challenges affecting patient progress: multiple competing priorities, low volume of physical activity at present , menopause, and all or none mindset.    Pharmacotherapy for weight management: She is currently taking Metformin  (off label use for incretin effect and / or insulin  resistance and / or diabetes prevention) with adequate clinical response  and without side effects..   Assessment and Plan   Treatment Plan For Obesity:  Recommended Dietary Goals  [name] is currently in the action stage of change. As such, her goal is to continue weight management plan. She has agreed to: incorporate 1-2 meal replacements a day for convenience , continue current plan, and she was provided with a meal replacement plan and a list of healthy protein shakes and protein bars.  Behavioral Health and Counseling  We discussed the following behavioral modification strategies today: continue to work on maintaining a reduced calorie state, getting the recommended amount of protein, incorporating whole foods, making healthy choices, staying well hydrated and practicing mindfulness when eating. and avoid all or none thinking.  Additional education and resources provided today: Handout on healthy protein shakes and bars  Recommended Physical Activity Goals  [name] has been advised to work up to 150 minutes of moderate intensity aerobic activity a week and strengthening exercises 2-3 times per week for cardiovascular health, weight loss maintenance and preservation of muscle mass.   She has agreed to :  Think about enjoyable ways to increase daily physical activity and overcoming barriers to exercise,  Increase physical activity in their day and reduce sedentary time (increase NEAT)., and she is considering swimming at the [facility] when she drops off her granddaughter for volleyball  Medical Interventions and Pharmacotherapy  We discussed various medication options to help [name] with her weight  loss efforts and we both agreed to : Increase metformin  XR 500 mg twice a day  Associated Conditions Impacted by Obesity Treatment  Assessment & Plan Prediabetes She has been on metformin  for one month with no side effects. Discussed metformin 's benefits in weight loss and diabetes prevention. Considered increasing metformin  dosage to twice daily, but she prefers to maintain the current dose until blood work is reviewed. Metformin  can take up to three months to affect A1c levels. - Order A1c test to assess current status of prediabetes. - Increase metformin  XR to 500 mg twice a day - Last hemoglobin A1c 6.0 Pure hypercholesterolemia Patient has a very high LDL cholesterol suggestive of familial hypercholesterolemia.  I conveyed to her that this may not necessarily be nutritional or associated with obesity and may be more of a problem of how her body handles cholesterol.  We discussed reducing saturated fats in diet to less than 10% of calories.  Also reducing simple and added sugars.  Patient has tried lifestyle changes we will check fasting lipid profile today. Class 2 severe obesity with serious comorbidity and body mass index (BMI) of 36.0 to 36.9 in adult, unspecified obesity type (HCC) She is adhering to a 1200 calorie meal plan 50-60% of the time and has lost 12 pounds. She is not currently exercising or tracking calories. Discussed the importance of maintaining a calorie deficit for weight loss, the role of exercise, and the benefits of gradual weight loss. Emphasized protein intake for appetite suppression and metabolism boost. Encouraged habit stacking and finding enjoyable physical activities. Discussed meal replacement options with protein bars and shakes to simplify meal planning. - Encourage habit stacking to incorporate exercise when dropping daughter off at activities. - Consider meal replacement with protein bars and shakes for 1-2 meals a day to simplify meal planning. - Encourage  consumption of whole foods, lean proteins, and complex carbohydrates. - Provide resources for easy protein-rich recipes and meal planning. - Reinforce the importance of consistency over perfectionism in dietary habits.     General Health Maintenance Discussed the importance of adequate hydration and regular meals. Encouraged multivitamin use, though insurance coverage for the prescribed multivitamin is an issue. - Check with pharmacy regarding coverage of prescribed multivitamin. - Ensure adequate hydration and regular meal consumption.        Objective   Physical Exam:  Blood pressure 125/86, pulse 66, temperature 97.6 F (36.4 C), height 5' 6 (1.676 m), weight 222 lb (100.7 kg), last menstrual period 11/29/2015, SpO2 97%. Body mass index is 35.83 kg/m.  General: She is overweight, cooperative, alert, well developed, and in no acute distress. PSYCH: Has normal mood, affect and thought process.   HEENT: EOMI, sclerae are anicteric. Lungs: Normal breathing effort, no conversational dyspnea. Extremities: No edema.  Neurologic: No gross sensory or motor deficits. No tremors or fasciculations noted.    Diagnostic Data Reviewed:  BMET    Component Value Date/Time   NA 139 12/13/2023 1218   K 4.5 12/13/2023 1218   CL 102 12/13/2023 1218   CO2 24 12/13/2023 1218   GLUCOSE 91 12/13/2023 1218   GLUCOSE 99 03/29/2023 0841   BUN 14 12/13/2023 1218   CREATININE 0.81 12/13/2023 1218   CREATININE 0.84  08/28/2020 1508   CALCIUM  9.8 12/13/2023 1218   GFRNONAA 84 (L) 10/23/2011 1637   GFRAA >90 10/23/2011 1637   Lab Results  Component Value Date   HGBA1C 6.0 10/16/2023   HGBA1C 6.1 (H) 06/01/2011   Lab Results  Component Value Date   INSULIN  12.7 12/13/2023   Lab Results  Component Value Date   TSH 1.080 12/13/2023   CBC    Component Value Date/Time   WBC 5.0 12/13/2023 1218   WBC 4.9 04/06/2022 1057   RBC 4.73 12/13/2023 1218   RBC 4.71 04/06/2022 1057   HGB 14.0  12/13/2023 1218   HCT 43.3 12/13/2023 1218   PLT 273 12/13/2023 1218   MCV 92 12/13/2023 1218   MCH 29.6 12/13/2023 1218   MCH 30.8 08/28/2020 1508   MCHC 32.3 12/13/2023 1218   MCHC 32.8 04/06/2022 1057   RDW 13.1 12/13/2023 1218   Iron Studies    Component Value Date/Time   IRON 97 12/13/2023 1218   TIBC 307 12/13/2023 1218   FERRITIN 81 12/13/2023 1218   IRONPCTSAT 32 12/13/2023 1218   Lipid Panel     Component Value Date/Time   CHOL 298 (H) 12/13/2023 1218   TRIG 190 (H) 12/13/2023 1218   HDL 60 12/13/2023 1218   CHOLHDL 4 03/29/2023 0841   VLDL 28.2 03/29/2023 0841   LDLCALC 202 (H) 12/13/2023 1218   LDLCALC 159 (H) 08/28/2020 1508   Hepatic Function Panel     Component Value Date/Time   PROT 7.0 12/13/2023 1218   ALBUMIN 4.5 12/13/2023 1218   AST 16 12/13/2023 1218   ALT 22 12/13/2023 1218   ALKPHOS 87 12/13/2023 1218   BILITOT 0.4 12/13/2023 1218      Component Value Date/Time   TSH 1.080 12/13/2023 1218   Nutritional Lab Results  Component Value Date   VD25OH 32.09 04/01/2024   VD25OH 20.81 (L) 10/16/2023   VD25OH 22.22 (L) 03/29/2023    Medications: Outpatient Encounter Medications as of 06/05/2024  Medication Sig   lamoTRIgine  (LAMICTAL ) 25 MG tablet Take 1 tablet (25 mg total) by mouth daily.   Multiple Vitamins-Minerals (MULTIVITAMIN WITH MINERALS) tablet Take 1 tablet by mouth daily.   rosuvastatin  (CRESTOR ) 10 MG tablet Take 1 tablet (10 mg total) by mouth daily.   Vitamin D , Ergocalciferol , (DRISDOL ) 1.25 MG (50000 UNIT) CAPS capsule TAKE 1 CAPSULE (50,000 UNITS TOTAL) BY MOUTH EVERY 7 (SEVEN) DAYS   [DISCONTINUED] Cholecalciferol (VITAMIN D3) 25 MCG (1000 UT) CAPS Take 1 capsule (1,000 Units total) by mouth daily.   [DISCONTINUED] metFORMIN  (GLUCOPHAGE -XR) 500 MG 24 hr tablet Take 1 tablet (500 mg total) by mouth daily with breakfast.   metFORMIN  (GLUCOPHAGE -XR) 500 MG 24 hr tablet Take 1 tablet (500 mg total) by mouth 2 (two) times daily with  a meal.   No facility-administered encounter medications on file as of 06/05/2024.     Follow-Up   Return in about 4 weeks (around 07/03/2024) for For Weight Mangement with Dr. Francyne.SABRA She was informed of the importance of frequent follow up visits to maximize her success with intensive lifestyle modifications for her multiple health conditions.  Attestation Statement   Reviewed by clinician on day of visit: allergies, medications, problem list, medical history, surgical history, family history, social history, and previous encounter notes.     Lucas Francyne, MD

## 2024-06-06 ENCOUNTER — Ambulatory Visit (INDEPENDENT_AMBULATORY_CARE_PROVIDER_SITE_OTHER): Payer: Self-pay | Admitting: Internal Medicine

## 2024-06-06 LAB — HEMOGLOBIN A1C
Est. average glucose Bld gHb Est-mCnc: 123 mg/dL
Hgb A1c MFr Bld: 5.9 % — ABNORMAL HIGH (ref 4.8–5.6)

## 2024-06-06 LAB — LIPID PANEL WITH LDL/HDL RATIO
Cholesterol, Total: 191 mg/dL (ref 100–199)
HDL: 50 mg/dL (ref >39)
LDL Chol Calc (NIH): 101 mg/dL — ABNORMAL HIGH (ref 0–99)
LDL/HDL Ratio: 2 ratio (ref 0.0–3.2)
Triglycerides: 232 mg/dL — ABNORMAL HIGH (ref 0–149)
VLDL Cholesterol Cal: 40 mg/dL (ref 5–40)

## 2024-06-27 ENCOUNTER — Other Ambulatory Visit (INDEPENDENT_AMBULATORY_CARE_PROVIDER_SITE_OTHER): Payer: Self-pay | Admitting: Internal Medicine

## 2024-06-27 DIAGNOSIS — R7303 Prediabetes: Secondary | ICD-10-CM

## 2024-07-03 ENCOUNTER — Ambulatory Visit (INDEPENDENT_AMBULATORY_CARE_PROVIDER_SITE_OTHER): Admitting: Internal Medicine

## 2024-07-03 ENCOUNTER — Other Ambulatory Visit (INDEPENDENT_AMBULATORY_CARE_PROVIDER_SITE_OTHER): Payer: Self-pay | Admitting: Internal Medicine

## 2024-07-03 DIAGNOSIS — R7303 Prediabetes: Secondary | ICD-10-CM

## 2024-07-11 ENCOUNTER — Other Ambulatory Visit: Payer: Self-pay | Admitting: Family Medicine

## 2024-07-11 DIAGNOSIS — E559 Vitamin D deficiency, unspecified: Secondary | ICD-10-CM

## 2024-07-23 ENCOUNTER — Telehealth (INDEPENDENT_AMBULATORY_CARE_PROVIDER_SITE_OTHER): Payer: Self-pay | Admitting: Internal Medicine

## 2024-07-23 ENCOUNTER — Other Ambulatory Visit (INDEPENDENT_AMBULATORY_CARE_PROVIDER_SITE_OTHER): Payer: Self-pay | Admitting: Internal Medicine

## 2024-07-23 DIAGNOSIS — R7303 Prediabetes: Secondary | ICD-10-CM

## 2024-07-23 MED ORDER — METFORMIN HCL ER 500 MG PO TB24: 500.0000 mg | ORAL_TABLET | Freq: Two times a day (BID) | ORAL | 1 refills | Status: DC

## 2024-07-23 NOTE — Telephone Encounter (Signed)
 Patient is out of Metformin , and would like a refill before her next appt if possible. Pt has an appt on 09/02. Patient stated that she could not come in any time this week.

## 2024-07-30 ENCOUNTER — Ambulatory Visit (INDEPENDENT_AMBULATORY_CARE_PROVIDER_SITE_OTHER): Admitting: Internal Medicine

## 2024-07-30 ENCOUNTER — Encounter (INDEPENDENT_AMBULATORY_CARE_PROVIDER_SITE_OTHER): Payer: Self-pay | Admitting: Internal Medicine

## 2024-07-30 VITALS — BP 121/83 | HR 67 | Temp 98.1°F | Ht 66.0 in | Wt 225.0 lb

## 2024-07-30 DIAGNOSIS — E7800 Pure hypercholesterolemia, unspecified: Secondary | ICD-10-CM | POA: Diagnosis not present

## 2024-07-30 DIAGNOSIS — Z6836 Body mass index (BMI) 36.0-36.9, adult: Secondary | ICD-10-CM

## 2024-07-30 DIAGNOSIS — R7303 Prediabetes: Secondary | ICD-10-CM | POA: Diagnosis not present

## 2024-07-30 DIAGNOSIS — G4700 Insomnia, unspecified: Secondary | ICD-10-CM

## 2024-07-30 DIAGNOSIS — E66812 Obesity, class 2: Secondary | ICD-10-CM

## 2024-07-30 MED ORDER — METFORMIN HCL ER 500 MG PO TB24: 500.0000 mg | ORAL_TABLET | Freq: Two times a day (BID) | ORAL | 2 refills | Status: DC

## 2024-07-30 NOTE — Assessment & Plan Note (Signed)
 Obesity with associated prediabetes and dyslipidemia Obesity with a BMI of 36, associated with prediabetes and dyslipidemia. Weight gain of three pounds since last visit. Reports inconsistent adherence to a 1200 calorie nutrition plan, skipping meals, and lack of physical activity. Acknowledges stress and emotional factors affecting lifestyle changes. Metformin  was not taken for a week due to running out of medication. Discussed the potential use of Qsymia, an appetite suppressant, with caution due to panic disorder and PTSD. Emphasized the importance of personal motivations and sustainable lifestyle changes. Lifestyle changes alone result in 10-20% of people losing 5-10% of their weight, highlighting the potential benefit of medication in weight management. - Resume metformin , 1 tablet twice a day with food. - Consider Qsymia for appetite suppression, with caution due to panic disorder and PTSD. - Encourage consistent adherence to a 1200 calorie nutrition plan. - Incorporate physical activity into daily routine. - Consider meal replacements such as Factor or Long Life Meal Prep. - Use protein shakes as meal replacements to ensure adequate protein intake.  Insomnia Reports difficulty falling asleep, potentially related to stress and lifestyle changes. No caffeine intake after 3 PM. Recent disruption in sleep schedule due to granddaughter's sleep pattern. No clear link to current medications identified. - Encourage establishment of a consistent sleep schedule.

## 2024-07-30 NOTE — Progress Notes (Signed)
 Office: [phone]  /  Fax: [phone]  Weight Summary and Body Composition Analysis (BIA)  Vitals Temp: 98.1 F (36.7 C) BP: 121/83 Pulse Rate: 67 SpO2: 95 %   Anthropometric Measurements Height: 5' 6 (1.676 m) Weight: 225 lb (102.1 kg) BMI (Calculated): 36.33 Weight at Last Visit: 222 lb Weight Lost Since Last Visit: 0 lb Weight Gained Since Last Visit: 3 lb Starting Weight: 234 lb Total Weight Loss (lbs): 9 lb (4.082 kg) Peak Weight: 237 lb   Body Composition  Body Fat %: 44.5 % Fat Mass (lbs): 100.4 lbs Muscle Mass (lbs): 118.6 lbs Total Body Water (lbs): 82 lbs Visceral Fat Rating : 13    RMR: 1770  Today's Visit #: 6  Starting Date: 12/03/23   Subjective   Chief Complaint: Obesity  Interval History Discussed the use of AI scribe software for clinical note transcription with the patient, who gave verbal consent to proceed.  History of Present Illness [name] is a 58 year old female with prediabetes and hypercholesterolemia who presents for medical weight management.  She has gained three pounds since her last visit, attributing this to inconsistent nutrition and lack of physical activity. She follows a 1200 calorie nutrition plan but has low adherence, often skipping meals and not consuming the recommended amount of protein. Despite maintaining adequate hydration, she has not been exercising.  She has been off metformin  for a week due to running out of the medication. Her weight usually fluctuates between 222 and 225 pounds, and she feels that inconsistency in her routine contributes to her weight maintenance. She wants to feel healthier and have more energy, partly to keep up with her granddaughter, [name].  Her sleep has been affected since starting medications for cholesterol and prediabetes, including metformin . No caffeine consumption after 3 PM, and she primarily drinks water. Her granddaughter's irregular sleep schedule has also  impacted her own routine.  She has been taking metformin  for prediabetes, but due to recent schedule disruptions, she has only been able to take it once a day instead of the prescribed twice daily.  She discusses her social situation, including the stress of caring for her granddaughter, [name], who is also struggling with weight issues. She mentions the emotional impact of her ex-husband's death and the challenges of being both a grandmother and a mother figure.  She acknowledges making some positive changes, such as reducing junk food in the house and preparing healthier meals, but feels she needs to be more consistent. She is frustrated that despite these efforts, she has not seen significant weight loss.  Her triglycerides were noted to be high, which she attributes to possibly consuming more simple carbohydrates recently. Her A1c has decreased from 6 to 5.9, indicating some improvement in her prediabetes management.     Challenges affecting patient progress: multiple competing priorities, low volume of physical activity at present , menopause, and all-or- none mindset.    Pharmacotherapy for weight management: She is currently taking Metformin  (off label use for weight management and / or insulin  resistance and / or diabetes prevention) with adequate clinical response  and without side effects..   Assessment and Plan   Treatment Plan For Obesity:  Recommended Dietary Goals  Cameo is currently in the action stage of change. As such, her goal is to continue weight management plan. She has agreed to: incorporate prepackaged healthy meals for convenience, incorporate 1-2 meal replacements a day for convenience , and continue current plan  Behavioral Health and Counseling  We discussed the following behavioral modification strategies today: continue to work on maintaining a reduced calorie state, getting the recommended amount of protein, incorporating whole foods, making healthy choices,  staying well hydrated and practicing mindfulness when eating., increase protein intake, fibrous foods (25 grams per day for women, 30 grams for men) and water to improve satiety and decrease hunger signals. , getting back on track after recent relapse, avoid all or none thinking, and rethink your why .  Additional education and resources provided today: None  Recommended Physical Activity Goals  Almeta has been advised to work up to 150 minutes of moderate intensity aerobic activity a week and strengthening exercises 2-3 times per week for cardiovascular health, weight loss maintenance and preservation of muscle mass.  She has agreed to :  Think about enjoyable ways to increase daily physical activity and overcoming barriers to exercise, Increase physical activity in their day and reduce sedentary time (increase NEAT)., Increase volume of physical activity to a goal of 240 minutes a week, and Combine aerobic and strengthening exercises for efficiency and improved cardiometabolic health.  Medical Interventions and Pharmacotherapy  We discussed various medication options to help [facility] with her weight loss efforts and we both agreed to : Adequate clinical response to anti-obesity medication, continue current regimen and we discussed briefly the role of antiobesity medications.  But she will be working on getting back on track with nutrition and behavioral approaches first.  Associated Conditions Impacted by Obesity Treatment  Assessment & Plan Class 2 severe obesity with serious comorbidity and body mass index (BMI) of 36.0 to 36.9 in adult, unspecified obesity type (HCC) Prediabetes Pure hypercholesterolemia Obesity with associated prediabetes and dyslipidemia Obesity with a BMI of 36, associated with prediabetes and dyslipidemia. Weight gain of three pounds since last visit. Reports inconsistent adherence to a 1200 calorie nutrition plan, skipping meals, and lack of physical activity. Acknowledges  stress and emotional factors affecting lifestyle changes. Metformin  was not taken for a week due to running out of medication. Discussed the potential use of Qsymia, an appetite suppressant, with caution due to panic disorder and PTSD. Emphasized the importance of personal motivations and sustainable lifestyle changes. Lifestyle changes alone result in 10-20% of people losing 5-10% of their weight, highlighting the potential benefit of medication in weight management. - Resume metformin , 1 tablet twice a day with food. - Consider Qsymia for appetite suppression, with caution due to panic disorder and PTSD. - Encourage consistent adherence to a 1200 calorie nutrition plan. - Incorporate physical activity into daily routine. - Consider meal replacements such as Factor or Long Life Meal Prep. - Use protein shakes as meal replacements to ensure adequate protein intake.  Insomnia Reports difficulty falling asleep, potentially related to stress and lifestyle changes. No caffeine intake after 3 PM. Recent disruption in sleep schedule due to granddaughter's sleep pattern. No clear link to current medications identified. - Encourage establishment of a consistent sleep schedule.          Objective   Physical Exam:  Blood pressure 121/83, pulse 67, temperature 98.1 F (36.7 C), height 5' 6 (1.676 m), weight 225 lb (102.1 kg), last menstrual period 11/29/2015, SpO2 95%. Body mass index is 36.32 kg/m.  General: She is overweight, cooperative, alert, well developed, and in no acute distress. PSYCH: Has normal mood, affect and thought process.   HEENT: EOMI, sclerae are anicteric. Lungs: Normal breathing effort, no conversational dyspnea. Extremities: No edema.  Neurologic: No gross sensory or motor deficits. No tremors  or fasciculations noted.    Diagnostic Data Reviewed:  BMET    Component Value Date/Time   NA 139 12/13/2023 1218   K 4.5 12/13/2023 1218   CL 102 12/13/2023 1218   CO2 24  12/13/2023 1218   GLUCOSE 91 12/13/2023 1218   GLUCOSE 99 03/29/2023 0841   BUN 14 12/13/2023 1218   CREATININE 0.81 12/13/2023 1218   CREATININE 0.84 08/28/2020 1508   CALCIUM  9.8 12/13/2023 1218   GFRNONAA 84 (L) 10/23/2011 1637   GFRAA >90 10/23/2011 1637   Lab Results  Component Value Date   HGBA1C 5.9 (H) 06/05/2024   HGBA1C 6.1 (H) 06/01/2011   Lab Results  Component Value Date   INSULIN  12.7 12/13/2023   Lab Results  Component Value Date   TSH 1.080 12/13/2023   CBC    Component Value Date/Time   WBC 5.0 12/13/2023 1218   WBC 4.9 04/06/2022 1057   RBC 4.73 12/13/2023 1218   RBC 4.71 04/06/2022 1057   HGB 14.0 12/13/2023 1218   HCT 43.3 12/13/2023 1218   PLT 273 12/13/2023 1218   MCV 92 12/13/2023 1218   MCH 29.6 12/13/2023 1218   MCH 30.8 08/28/2020 1508   MCHC 32.3 12/13/2023 1218   MCHC 32.8 04/06/2022 1057   RDW 13.1 12/13/2023 1218   Iron Studies    Component Value Date/Time   IRON 97 12/13/2023 1218   TIBC 307 12/13/2023 1218   FERRITIN 81 12/13/2023 1218   IRONPCTSAT 32 12/13/2023 1218   Lipid Panel     Component Value Date/Time   CHOL 191 06/05/2024 1001   TRIG 232 (H) 06/05/2024 1001   HDL 50 06/05/2024 1001   CHOLHDL 4 03/29/2023 0841   VLDL 28.2 03/29/2023 0841   LDLCALC 101 (H) 06/05/2024 1001   LDLCALC 159 (H) 08/28/2020 1508   Hepatic Function Panel     Component Value Date/Time   PROT 7.0 12/13/2023 1218   ALBUMIN 4.5 12/13/2023 1218   AST 16 12/13/2023 1218   ALT 22 12/13/2023 1218   ALKPHOS 87 12/13/2023 1218   BILITOT 0.4 12/13/2023 1218      Component Value Date/Time   TSH 1.080 12/13/2023 1218   Nutritional Lab Results  Component Value Date   VD25OH 32.09 04/01/2024   VD25OH 20.81 (L) 10/16/2023   VD25OH 22.22 (L) 03/29/2023    Medications: Outpatient Encounter Medications as of 07/30/2024  Medication Sig   lamoTRIgine  (LAMICTAL ) 25 MG tablet Take 1 tablet (25 mg total) by mouth daily.   metFORMIN   (GLUCOPHAGE -XR) 500 MG 24 hr tablet Take 1 tablet (500 mg total) by mouth 2 (two) times daily with a meal.   Multiple Vitamins-Minerals (MULTIVITAMIN WITH MINERALS) tablet Take 1 tablet by mouth daily.   rosuvastatin  (CRESTOR ) 10 MG tablet Take 1 tablet (10 mg total) by mouth daily.   Vitamin D , Ergocalciferol , (DRISDOL ) 1.25 MG (50000 UNIT) CAPS capsule TAKE 1 CAPSULE (50,000 UNITS TOTAL) BY MOUTH EVERY 7 (SEVEN) DAYS   No facility-administered encounter medications on file as of 07/30/2024.     Follow-Up   No follow-ups on file.SABRA She was informed of the importance of frequent follow up visits to maximize her success with intensive lifestyle modifications for her multiple health conditions.  Attestation Statement   Reviewed by clinician on day of visit: allergies, medications, problem list, medical history, surgical history, family history, social history, and previous encounter notes.     Lucas Parker, MD

## 2024-08-27 ENCOUNTER — Encounter (INDEPENDENT_AMBULATORY_CARE_PROVIDER_SITE_OTHER): Payer: Self-pay | Admitting: Internal Medicine

## 2024-08-27 ENCOUNTER — Ambulatory Visit (INDEPENDENT_AMBULATORY_CARE_PROVIDER_SITE_OTHER): Admitting: Internal Medicine

## 2024-08-27 VITALS — BP 136/84 | HR 70 | Temp 98.3°F | Ht 66.0 in | Wt 226.0 lb

## 2024-08-27 DIAGNOSIS — R7303 Prediabetes: Secondary | ICD-10-CM | POA: Diagnosis not present

## 2024-08-27 DIAGNOSIS — E66812 Obesity, class 2: Secondary | ICD-10-CM | POA: Diagnosis not present

## 2024-08-27 DIAGNOSIS — E7800 Pure hypercholesterolemia, unspecified: Secondary | ICD-10-CM | POA: Diagnosis not present

## 2024-08-27 DIAGNOSIS — Z6836 Body mass index (BMI) 36.0-36.9, adult: Secondary | ICD-10-CM | POA: Diagnosis not present

## 2024-08-27 NOTE — Assessment & Plan Note (Signed)
 Prediabetes managed with metformin  for diabetes prevention. Previous A1c was 6.0, reduced to 5.9 in July. She is concerned about the effectiveness of metformin , but it is emphasized that it takes time to see full benefits, including reduction of visceral fat and effects on gut microbiome. - Continue metformin  for diabetes prevention. - Plan to re-evaluate A1c and other relevant blood work in December. - He has 1-2 refills left

## 2024-08-27 NOTE — Progress Notes (Signed)
 Office: [phone]  /  Fax: [phone]  Weight Summary and Body Composition Analysis (BIA)  Vitals Temp: 98.3 F (36.8 C) BP: 136/84 Pulse Rate: 70 SpO2: 95 %   Anthropometric Measurements Height: 5' 6 (1.676 m) Weight: 226 lb (102.5 kg) BMI (Calculated): 36.49 Weight at Last Visit: 225 lb Weight Lost Since Last Visit: 0 lb Weight Gained Since Last Visit: 1 lb Starting Weight: 234 lb Total Weight Loss (lbs): 8 lb (3.629 kg) Peak Weight: 237 lb   Body Composition  Body Fat %: 45.3 % Fat Mass (lbs): 102.6 lbs Muscle Mass (lbs): 117.4 lbs Total Body Water (lbs): 84.8 lbs Visceral Fat Rating : 13    RMR: 1770  Today's Visit #: 7  Starting Date: 12/03/23   Subjective   Chief Complaint: Obesity  Interval History Discussed the use of AI scribe software for clinical note transcription with the patient, who gave verbal consent to proceed.  History of Present Illness [name] is a 58 year old female with hypercholesterolemia and prediabetes who presents for medical weight management.  Since last office visit has gained 1 pound.  She is following a 1200-calorie plan 40% of the time.  She has not been exercising  She has experienced weight gain over the last two visits, despite an initial weight loss from 238 pounds to 222 pounds. The weight gain began in July. She reports that she stopped exercising and walking around that time, and she has noticed increased snacking, sometimes related to emotions or tiredness. Her compliance with nutritional guidelines is suboptimal, particularly when not engaging in regular exercise. Currently, she follows her dietary plan about 40% of the time and has not been exercising regularly.  Her eating habits are influenced by tiredness and emotions, noting that she sometimes eats out of boredom or stress. She does well with her eating habits in the morning and afternoon but struggles with snacking at night, often consuming small  amounts of food mindlessly.  She has been using protein drinks to increase her protein intake and is trying to break long-standing habits. Her breakfast typically includes a yogurt bowl with fruit and honey or two eggs with vegetables. She is trying to incorporate more vegetables into her meals.  She is currently taking metformin  for diabetes prevention, and her A1c has decreased from 6.0 to 5.9.     Challenges affecting patient progress: having difficulty with meal prep and planning, having difficulty focusing on healthy eating, and low volume of physical activity at present .    Pharmacotherapy for weight management: She is currently taking Metformin  (off label use for weight management and / or insulin  resistance and / or diabetes prevention) with adequate clinical response  and without side effects..   Assessment and Plan   Treatment Plan For Obesity:  Recommended Dietary Goals  [name] is currently in the action stage of change. As such, her goal is to continue weight management plan. She has agreed to: incorporate prepackaged healthy meals for convenience, incorporate 1-2 meal replacements a day for convenience , and continue current plan  Behavioral Health and Counseling  We discussed the following behavioral modification strategies today: continue to work on maintaining a reduced calorie state, getting the recommended amount of protein, incorporating whole foods, making healthy choices, staying well hydrated and practicing mindfulness when eating. and increase protein intake, fibrous foods (25 grams per day for women, 30 grams for men) and water to improve satiety and decrease hunger signals. .  Additional education and resources  provided today: None  Recommended Physical Activity Goals  [name] has been advised to work up to 150 minutes of moderate intensity aerobic activity a week and strengthening exercises 2-3 times per week for cardiovascular health, weight loss maintenance and  preservation of muscle mass.  She has agreed to :  Think about enjoyable ways to increase daily physical activity and overcoming barriers to exercise, Increase physical activity in their day and reduce sedentary time (increase NEAT)., Increase volume of physical activity to a goal of 240 minutes a week, and Combine aerobic and strengthening exercises for efficiency and improved cardiometabolic health.  Medical Interventions and Pharmacotherapy  We discussed various medication options to help [name] with her weight loss efforts and we both agreed to : Continue with current nutritional and behavioral strategies  Associated Conditions Impacted by Obesity Treatment  Assessment & Plan Class 2 severe obesity with serious comorbidity and body mass index (BMI) of 36.0 to 36.9 in adult, unspecified obesity type Class 2 obesity with recent weight gain since July, following an initial decrease from 238 lbs to 222 lbs. Contributing factors include suboptimal compliance with nutrition, lack of exercise, emotional eating, and evening snacking. She is aware of these challenges and is working on sustainable lifestyle changes. Exercise is crucial for her weight management, with previous weight loss correlating with increased physical activity. She prefers lifestyle modifications over medication for appetite suppression. - Increase physical activity, aiming for regular exercise such as walking three days a week. - Focus on three balanced meals a day with adequate protein intake to reduce snacking. - Address emotional eating by identifying triggers and finding healthier coping mechanisms. - Consider medication for appetite suppression if lifestyle changes are insufficient, but she prefers to try lifestyle modifications first. Prediabetes Prediabetes managed with metformin  for diabetes prevention. Previous A1c was 6.0, reduced to 5.9 in July. She is concerned about the effectiveness of metformin , but it is emphasized  that it takes time to see full benefits, including reduction of visceral fat and effects on gut microbiome. - Continue metformin  for diabetes prevention. - Plan to re-evaluate A1c and other relevant blood work in December. - He has 1-2 refills left Pure hypercholesterolemia Pure hypercholesterolemia with previous improvement noted.  On rosuvastatin  10 mg once a day without any adverse effects.  She is on a management plan that includes lifestyle modifications and medication. - Re-evaluate cholesterol levels in December.          Objective   Physical Exam:  Blood pressure 136/84, pulse 70, temperature 98.3 F (36.8 C), height 5' 6 (1.676 m), weight 226 lb (102.5 kg), last menstrual period 11/29/2015, SpO2 95%. Body mass index is 36.48 kg/m.  General: She is overweight, cooperative, alert, well developed, and in no acute distress. PSYCH: Has normal mood, affect and thought process.   HEENT: EOMI, sclerae are anicteric. Lungs: Normal breathing effort, no conversational dyspnea. Extremities: No edema.  Neurologic: No gross sensory or motor deficits. No tremors or fasciculations noted.    Diagnostic Data Reviewed:  BMET    Component Value Date/Time   NA 139 12/13/2023 1218   K 4.5 12/13/2023 1218   CL 102 12/13/2023 1218   CO2 24 12/13/2023 1218   GLUCOSE 91 12/13/2023 1218   GLUCOSE 99 03/29/2023 0841   BUN 14 12/13/2023 1218   CREATININE 0.81 12/13/2023 1218   CREATININE 0.84 08/28/2020 1508   CALCIUM  9.8 12/13/2023 1218   GFRNONAA 84 (L) 10/23/2011 1637   GFRAA >90 10/23/2011 1637   Lab  Results  Component Value Date   HGBA1C 5.9 (H) 06/05/2024   HGBA1C 6.1 (H) 06/01/2011   Lab Results  Component Value Date   INSULIN  12.7 12/13/2023   Lab Results  Component Value Date   TSH 1.080 12/13/2023   CBC    Component Value Date/Time   WBC 5.0 12/13/2023 1218   WBC 4.9 04/06/2022 1057   RBC 4.73 12/13/2023 1218   RBC 4.71 04/06/2022 1057   HGB 14.0 12/13/2023  1218   HCT 43.3 12/13/2023 1218   PLT 273 12/13/2023 1218   MCV 92 12/13/2023 1218   MCH 29.6 12/13/2023 1218   MCH 30.8 08/28/2020 1508   MCHC 32.3 12/13/2023 1218   MCHC 32.8 04/06/2022 1057   RDW 13.1 12/13/2023 1218   Iron Studies    Component Value Date/Time   IRON 97 12/13/2023 1218   TIBC 307 12/13/2023 1218   FERRITIN 81 12/13/2023 1218   IRONPCTSAT 32 12/13/2023 1218   Lipid Panel     Component Value Date/Time   CHOL 191 06/05/2024 1001   TRIG 232 (H) 06/05/2024 1001   HDL 50 06/05/2024 1001   CHOLHDL 4 03/29/2023 0841   VLDL 28.2 03/29/2023 0841   LDLCALC 101 (H) 06/05/2024 1001   LDLCALC 159 (H) 08/28/2020 1508   Hepatic Function Panel     Component Value Date/Time   PROT 7.0 12/13/2023 1218   ALBUMIN 4.5 12/13/2023 1218   AST 16 12/13/2023 1218   ALT 22 12/13/2023 1218   ALKPHOS 87 12/13/2023 1218   BILITOT 0.4 12/13/2023 1218      Component Value Date/Time   TSH 1.080 12/13/2023 1218   Nutritional Lab Results  Component Value Date   VD25OH 32.09 04/01/2024   VD25OH 20.81 (L) 10/16/2023   VD25OH 22.22 (L) 03/29/2023    Medications: Outpatient Encounter Medications as of 08/27/2024  Medication Sig   lamoTRIgine  (LAMICTAL ) 25 MG tablet Take 1 tablet (25 mg total) by mouth daily.   metFORMIN  (GLUCOPHAGE -XR) 500 MG 24 hr tablet Take 1 tablet (500 mg total) by mouth 2 (two) times daily with a meal.   Multiple Vitamins-Minerals (MULTIVITAMIN WITH MINERALS) tablet Take 1 tablet by mouth daily.   rosuvastatin  (CRESTOR ) 10 MG tablet Take 1 tablet (10 mg total) by mouth daily.   Vitamin D , Ergocalciferol , (DRISDOL ) 1.25 MG (50000 UNIT) CAPS capsule TAKE 1 CAPSULE (50,000 UNITS TOTAL) BY MOUTH EVERY 7 (SEVEN) DAYS   No facility-administered encounter medications on file as of 08/27/2024.     Follow-Up   No follow-ups on file.SABRA She was informed of the importance of frequent follow up visits to maximize her success with intensive lifestyle modifications  for her multiple health conditions.  Attestation Statement   Reviewed by clinician on day of visit: allergies, medications, problem list, medical history, surgical history, family history, social history, and previous encounter notes.     Kristin Parker, MD

## 2024-08-27 NOTE — Assessment & Plan Note (Signed)
 Pure hypercholesterolemia with previous improvement noted.  On rosuvastatin  10 mg once a day without any adverse effects.  She is on a management plan that includes lifestyle modifications and medication. - Re-evaluate cholesterol levels in December.

## 2024-08-27 NOTE — Assessment & Plan Note (Signed)
 Class 2 obesity with recent weight gain since July, following an initial decrease from 238 lbs to 222 lbs. Contributing factors include suboptimal compliance with nutrition, lack of exercise, emotional eating, and evening snacking. She is aware of these challenges and is working on sustainable lifestyle changes. Exercise is crucial for her weight management, with previous weight loss correlating with increased physical activity. She prefers lifestyle modifications over medication for appetite suppression. - Increase physical activity, aiming for regular exercise such as walking three days a week. - Focus on three balanced meals a day with adequate protein intake to reduce snacking. - Address emotional eating by identifying triggers and finding healthier coping mechanisms. - Consider medication for appetite suppression if lifestyle changes are insufficient, but she prefers to try lifestyle modifications first.

## 2024-09-03 DIAGNOSIS — E119 Type 2 diabetes mellitus without complications: Secondary | ICD-10-CM | POA: Diagnosis not present

## 2024-09-03 DIAGNOSIS — H5203 Hypermetropia, bilateral: Secondary | ICD-10-CM | POA: Diagnosis not present

## 2024-09-03 LAB — OPHTHALMOLOGY REPORT-SCANNED

## 2024-09-03 NOTE — Progress Notes (Signed)
   09/03/2024  Patient ID: [name], female   DOB: May 18, 1966, 58 y.o.   MRN: [id number]  Pharmacy Quality Measure Review  This patient is appearing on a report for being at risk of failing the adherence measure for diabetes medications this calendar year.   Medication: Metformin  Last fill date: 08/19/24 for 90 day supply  Insurance report was not up to date. No action needed at this time.   Jon VEAR Lindau, PharmD Clinical Pharmacist [phone]

## 2024-09-24 ENCOUNTER — Ambulatory Visit (INDEPENDENT_AMBULATORY_CARE_PROVIDER_SITE_OTHER): Admitting: Internal Medicine

## 2024-09-26 ENCOUNTER — Other Ambulatory Visit: Payer: Self-pay | Admitting: Family Medicine

## 2024-09-26 DIAGNOSIS — E7800 Pure hypercholesterolemia, unspecified: Secondary | ICD-10-CM

## 2024-10-02 ENCOUNTER — Ambulatory Visit: Admitting: Family Medicine

## 2024-10-02 ENCOUNTER — Encounter: Payer: Self-pay | Admitting: Family Medicine

## 2024-10-02 VITALS — BP 122/80 | HR 63 | Temp 97.8°F | Ht 66.0 in | Wt 227.4 lb

## 2024-10-02 DIAGNOSIS — E66812 Obesity, class 2: Secondary | ICD-10-CM | POA: Diagnosis not present

## 2024-10-02 DIAGNOSIS — R7303 Prediabetes: Secondary | ICD-10-CM | POA: Diagnosis not present

## 2024-10-02 DIAGNOSIS — Z6836 Body mass index (BMI) 36.0-36.9, adult: Secondary | ICD-10-CM

## 2024-10-02 NOTE — Progress Notes (Signed)
 Established Patient Office Visit  Subjective   Patient ID: [name], female    DOB: [birth year]  Age: 58 y.o. MRN: [id number]  Chief Complaint  Patient presents with   Medical Management of Chronic Issues    HPI Discussed the use of AI scribe software for clinical note transcription with the patient, who gave verbal consent to proceed.  History of Present Illness   [name] is a 58 year old female with prediabetes and bipolar affective disorder who presents for a six-month follow-up visit.  She has [facility] a [facility], achieving gradual weight loss. She follows a 1200 calorie diet and has resumed gym activities, focusing on strength training three times a week and increasing her walking routine.  Her triglyceride levels have increased despite reducing alcohol consumption to one or two drinks over the weekend. She is interested in understanding the impact of her lifestyle changes on her triglyceride levels.  She takes metformin  for prediabetes but occasionally forgets to take it twice daily. She also takes vitamin D  and B12 supplements.  Her bipolar affective disorder is stable without Lamictal , which she stopped two months ago. She has developed behavioral strategies and coping mechanisms from past therapy. She also has PTSD and managed a panic episode in the summer without medication.  She is raising her granddaughter and coping with the recent death of her ex-husband, maintaining a positive outlook despite these stressors.       Current Outpatient Medications  Medication Instructions   Cyanocobalamin  (VITAMIN B-12 PO) Daily   metFORMIN  (GLUCOPHAGE -XR) 500 mg, Oral, 2 times daily with meals   Multiple Vitamins-Minerals (MULTIVITAMIN WITH MINERALS) tablet 1 tablet, Oral, Daily   rosuvastatin  (CRESTOR ) 10 mg, Oral, Daily   Vitamin D  (Ergocalciferol ) (DRISDOL ) 50,000 Units, Oral, Every 7 days    Patient Active Problem List    Diagnosis Date Noted   Family history of hemochromatosis 12/13/2023   Class 2 severe obesity with serious comorbidity and body mass index (BMI) of 36.0 to 36.9 in adult 11/08/2023   Prediabetes 11/08/2023   Pure hypercholesterolemia 11/08/2023   B12 deficiency 07/29/2022   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     Objective:     BP 122/80   Pulse 63   Temp 97.8 F (36.6 C) (Oral)   Ht 5' 6 (1.676 m)   Wt 227 lb 6.4 oz (103.1 kg)   LMP 11/29/2015 (Approximate)   SpO2 99%   BMI 36.70 kg/m    Physical Exam Vitals reviewed.  Constitutional:      Appearance: Normal appearance. She is well-groomed. She is obese.  Cardiovascular:     Rate and Rhythm: Normal rate and regular rhythm.     Heart sounds: S1 normal and S2 normal.  Pulmonary:     Effort: Pulmonary effort is normal.     Breath sounds: Normal breath sounds and air entry.  Musculoskeletal:     Right lower leg: No edema.     Left lower leg: No edema.  Neurological:     Mental Status: She is alert and oriented to person, place, and time. Mental status is at baseline.  Gait: Gait is intact.  Psychiatric:        Mood and Affect: Mood and affect normal.        Speech: Speech normal.        Behavior: Behavior normal.        Judgment: Judgment normal.      No results found for any visits on 10/02/24.  Last lipids Lab Results  Component Value Date   CHOL 191 06/05/2024   HDL 50 06/05/2024   LDLCALC 101 (H) 06/05/2024   TRIG 232 (H) 06/05/2024   CHOLHDL 4 03/29/2023   Last hemoglobin A1c Lab Results  Component Value Date   HGBA1C 5.9 (H) 06/05/2024      The 10-year ASCVD risk score  (Arnett DK, et al., 2019) is: 3.1%    Assessment & Plan:  Prediabetes  Class 2 severe obesity with serious comorbidity and body mass index (BMI) of 36.0 to 36.9 in adult, unspecified obesity type   Assessment and Plan    Obesity, class 2 with prediabetes Obesity class 2 with prediabetes. Triglycerides elevated, likely due to dietary habits and previous alcohol consumption. Current diet is 1200 calories per day with a focus on reducing carbohydrates. Exercise includes strength training and walking, but consistency is an issue. Metformin  is being used to lower blood sugar and insulin  levels. Discussed the role of insulin  in weight gain and the importance of a low-carb diet over calorie restriction. Emphasized the importance of consistency in dietary habits and exercise. Discussed potential use of topiramate for weight loss, but she prefers to focus on current lifestyle changes. - Continue 1200 calorie diet with focus on reducing carbohydrates to less than 90 grams per day. - Encouraged use of a carb tracking app to monitor carbohydrate intake. - Continue strength training and walking, aiming for consistency. - Continue metformin  as prescribed. - Provided handouts on low-carb diet and recommended using the Carb Manager app for recipes.  Bipolar affective disorder, currently stable Bipolar affective disorder, currently well-managed without medication. She has developed behavioral strategies and coping mechanisms. No recent manic episodes reported. She is aware of her condition and monitors her mood effectively. - Continue current management without medication unless symptoms change.  Post-traumatic stress disorder (PTSD), currently stable PTSD, currently well-managed. She experienced a panic attack in the summer but managed it without medication. No frequent panic attacks reported. - Continue current management without medication unless symptoms change.  General Health Maintenance Routine  health maintenance discussed. Mammogram due in February. - Will schedule mammogram in February. - Continue routine health maintenance and screenings.        Return in about 6 months (around 04/01/2025) for AWV, annual physical exam.    Heron CHRISTELLA Sharper, MD

## 2024-10-17 DIAGNOSIS — S96211S Strain of intrinsic muscle and tendon at ankle and foot level, right foot, sequela: Secondary | ICD-10-CM | POA: Diagnosis not present

## 2024-10-17 DIAGNOSIS — M722 Plantar fascial fibromatosis: Secondary | ICD-10-CM | POA: Diagnosis not present

## 2024-10-29 ENCOUNTER — Ambulatory Visit (INDEPENDENT_AMBULATORY_CARE_PROVIDER_SITE_OTHER): Payer: Self-pay | Admitting: Internal Medicine

## 2024-10-29 ENCOUNTER — Encounter (INDEPENDENT_AMBULATORY_CARE_PROVIDER_SITE_OTHER): Payer: Self-pay | Admitting: Internal Medicine

## 2024-10-29 VITALS — BP 128/84 | HR 70 | Temp 97.9°F | Ht 66.0 in | Wt 224.0 lb

## 2024-10-29 DIAGNOSIS — R7303 Prediabetes: Secondary | ICD-10-CM

## 2024-10-29 DIAGNOSIS — Z6836 Body mass index (BMI) 36.0-36.9, adult: Secondary | ICD-10-CM

## 2024-10-29 DIAGNOSIS — E66812 Obesity, class 2: Secondary | ICD-10-CM | POA: Diagnosis not present

## 2024-10-29 DIAGNOSIS — E7800 Pure hypercholesterolemia, unspecified: Secondary | ICD-10-CM

## 2024-10-29 NOTE — Progress Notes (Signed)
 Office: [phone]  /  Fax: [phone]  Weight Summary and Body Composition Analysis (BIA)  Vitals Temp: 97.9 F (36.6 C) BP: 128/84 Pulse Rate: 70 SpO2: 98 %   Anthropometric Measurements Height: 5' 6 (1.676 m) Weight: 224 lb (101.6 kg) BMI (Calculated): 36.17 Weight at Last Visit: 226 lb Weight Lost Since Last Visit: 2 lb Weight Gained Since Last Visit: 0 lb Starting Weight: 234 lb Total Weight Loss (lbs): 10 lb (4.536 kg) Peak Weight: 237 lb   Body Composition  Body Fat %: 45.1 % Fat Mass (lbs): 101.2 lbs Muscle Mass (lbs): 116.8 lbs Total Body Water (lbs): 82.4 lbs Visceral Fat Rating : 13    RMR: 1770  Today's Visit #: 8  Starting Date: 12/03/23   Subjective   Chief Complaint: Obesity  Interval History Discussed the use of AI scribe software for clinical note transcription with the patient, who gave verbal consent to proceed.  History of Present Illness [name] is a 58 year old female with prediabetes who presents for medical weight management.  She has experienced fluctuations in her weight, reaching a peak of 238 pounds. Over the past seven months, she lost 16 pounds but experienced some weight regain from July to November. Recently, she resumed her workout routine, including strength training. She plans to attend her workout sessions four times a week.  She targets a caloric intake of 1200 calories per day but finds it challenging to consistently meet this goal due to the effort required in meal preparation. She is trying to consume appropriate foods and is considering using an app to track her food intake.  She is currently taking metformin  for diabetes prevention and insulin  resistance. Her A1c levels have been trending downward. She is not interested in starting new medications for appetite suppression, preferring to focus on lifestyle changes.  She identifies emotional eating, particularly at night, as a challenge and is working on  addressing this by questioning her motivations for eating and using exercise as a coping mechanism. She has recently restarted her exercise routine, which she finds helpful in managing her nighttime eating habits.     Challenges affecting patient progress: none.    Pharmacotherapy for weight management: She is currently taking patient has declined pharmacotherapy in past and Metformin  (off label use for weight management and / or insulin  resistance and / or diabetes prevention) with adequate clinical response  and without side effects..   Assessment and Plan   Treatment Plan For Obesity:  Recommended Dietary Goals  [name] is currently in the action stage of change. As such, her goal is to continue weight management plan. She has agreed to: incorporate 1-2 meal replacements a day for convenience , continue current plan, and incorporate time restricted eating 16 hours fasting with an 8 hour eating window while maintaining reduced calorie nutrition plan  Behavioral Health and Counseling  We discussed the following behavioral modification strategies today: continue to work on maintaining a reduced calorie state, getting the recommended amount of protein, incorporating whole foods, making healthy choices, staying well hydrated and practicing mindfulness when eating. and increase protein intake, fibrous foods (25 grams per day for women, 30 grams for men) and water to improve satiety and decrease hunger signals. .  Additional education and resources provided today: None  Recommended Physical Activity Goals  [name] has been advised to work up to 150 minutes of moderate intensity aerobic activity a week and strengthening exercises 2-3 times per week for cardiovascular health, weight loss  maintenance and preservation of muscle mass.  She has agreed to :  Think about enjoyable ways to increase daily physical activity and overcoming barriers to exercise, Increase physical activity in their day and  reduce sedentary time (increase NEAT)., Increase volume of physical activity to a goal of 240 minutes a week, and Combine aerobic and strengthening exercises for efficiency and improved cardiometabolic health.  Medical Interventions and Pharmacotherapy  We discussed various medication options to help [name] with her weight loss efforts and we both agreed to : Has declined pharmacotherapy and Adequate clinical response to anti-obesity medication, continue current regimen  Associated Conditions Impacted by Obesity Treatment  Assessment & Plan Prediabetes Prediabetes managed with metformin  for diabetes prevention. Previous A1c was 6.0, reduced to 5.9 in July. She is concerned about the effectiveness of metformin , but it is emphasized that it takes time to see full benefits, including reduction of visceral fat and effects on gut microbiome. - Continue metformin  for diabetes prevention. - Check hemoglobin A1c today Pure hypercholesterolemia LDL cholesterol has improved triglycerides remain elevated.  She has been working on reducing simple and added sugars in her diet.  We will check fasting lipid profile today for clinical response Class 2 severe obesity with serious comorbidity and body mass index (BMI) of 36.0 to 36.9 in adult, unspecified obesity type Obesity management with recent weight loss due to increased physical activity and strength training. Challenges include maintaining a 1200 calorie diet and managing emotional eating. Discussed intermittent fasting and meal tracking for weight management. Consideration of GLP-1 agonists for appetite suppression, but she prefers to avoid additional medications. - Continue strength training and increase cardio exercises. - Track calorie intake using apps like My Fitness Pal or My Net Diary. - Consider intermittent fasting with a 16:8 schedule. - Use prepackaged meals or meal replacements to manage calorie intake. - Provided handout on holiday weight  management strategies.          Objective   Physical Exam:  Blood pressure 128/84, pulse 70, temperature 97.9 F (36.6 C), height 5' 6 (1.676 m), weight 224 lb (101.6 kg), last menstrual period 11/29/2015, SpO2 98%. Body mass index is 36.15 kg/m.  General: She is overweight, cooperative, alert, well developed, and in no acute distress. PSYCH: Has normal mood, affect and thought process.   HEENT: EOMI, sclerae are anicteric. Lungs: Normal breathing effort, no conversational dyspnea. Extremities: No edema.  Neurologic: No gross sensory or motor deficits. No tremors or fasciculations noted.    Diagnostic Data Reviewed:  BMET    Component Value Date/Time   NA 139 12/13/2023 1218   K 4.5 12/13/2023 1218   CL 102 12/13/2023 1218   CO2 24 12/13/2023 1218   GLUCOSE 91 12/13/2023 1218   GLUCOSE 99 03/29/2023 0841   BUN 14 12/13/2023 1218   CREATININE 0.81 12/13/2023 1218   CREATININE 0.84 08/28/2020 1508   CALCIUM  9.8 12/13/2023 1218   GFRNONAA 84 (L) 10/23/2011 1637   GFRAA >90 10/23/2011 1637   Lab Results  Component Value Date   HGBA1C 5.9 (H) 06/05/2024   HGBA1C 6.1 (H) 06/01/2011   Lab Results  Component Value Date   INSULIN  12.7 12/13/2023   Lab Results  Component Value Date   TSH 1.080 12/13/2023   CBC    Component Value Date/Time   WBC 5.0 12/13/2023 1218   WBC 4.9 04/06/2022 1057   RBC 4.73 12/13/2023 1218   RBC 4.71 04/06/2022 1057   HGB 14.0 12/13/2023 1218   HCT 43.3  12/13/2023 1218   PLT 273 12/13/2023 1218   MCV 92 12/13/2023 1218   MCH 29.6 12/13/2023 1218   MCH 30.8 08/28/2020 1508   MCHC 32.3 12/13/2023 1218   MCHC 32.8 04/06/2022 1057   RDW 13.1 12/13/2023 1218   Iron Studies    Component Value Date/Time   IRON 97 12/13/2023 1218   TIBC 307 12/13/2023 1218   FERRITIN 81 12/13/2023 1218   IRONPCTSAT 32 12/13/2023 1218   Lipid Panel     Component Value Date/Time   CHOL 191 06/05/2024 1001   TRIG 232 (H) 06/05/2024 1001   HDL  50 06/05/2024 1001   CHOLHDL 4 03/29/2023 0841   VLDL 28.2 03/29/2023 0841   LDLCALC 101 (H) 06/05/2024 1001   LDLCALC 159 (H) 08/28/2020 1508   Hepatic Function Panel     Component Value Date/Time   PROT 7.0 12/13/2023 1218   ALBUMIN 4.5 12/13/2023 1218   AST 16 12/13/2023 1218   ALT 22 12/13/2023 1218   ALKPHOS 87 12/13/2023 1218   BILITOT 0.4 12/13/2023 1218      Component Value Date/Time   TSH 1.080 12/13/2023 1218   Nutritional Lab Results  Component Value Date   VD25OH 32.09 04/01/2024   VD25OH 20.81 (L) 10/16/2023   VD25OH 22.22 (L) 03/29/2023    Medications: Outpatient Encounter Medications as of 10/29/2024  Medication Sig   Cyanocobalamin  (VITAMIN B-12 PO) Take by mouth daily.   metFORMIN  (GLUCOPHAGE -XR) 500 MG 24 hr tablet Take 1 tablet (500 mg total) by mouth 2 (two) times daily with a meal. (Patient taking differently: Take 500 mg by mouth daily with breakfast.)   Multiple Vitamins-Minerals (MULTIVITAMIN WITH MINERALS) tablet Take 1 tablet by mouth daily.   rosuvastatin  (CRESTOR ) 10 MG tablet TAKE 1 TABLET BY MOUTH EVERY DAY   Vitamin D , Ergocalciferol , (DRISDOL ) 1.25 MG (50000 UNIT) CAPS capsule TAKE 1 CAPSULE (50,000 UNITS TOTAL) BY MOUTH EVERY 7 (SEVEN) DAYS   No facility-administered encounter medications on file as of 10/29/2024.     Follow-Up   Return in about 4 weeks (around 11/26/2024) for For Weight Mangement with Dr. Francyne.SABRA She was informed of the importance of frequent follow up visits to maximize her success with intensive lifestyle modifications for her multiple health conditions.  Attestation Statement   Reviewed by clinician on day of visit: allergies, medications, problem list, medical history, surgical history, family history, social history, and previous encounter notes.     Lucas Francyne, MD

## 2024-10-29 NOTE — Assessment & Plan Note (Signed)
 LDL cholesterol has improved triglycerides remain elevated.  She has been working on reducing simple and added sugars in her diet.  We will check fasting lipid profile today for clinical response

## 2024-10-29 NOTE — Assessment & Plan Note (Signed)
 Prediabetes managed with metformin  for diabetes prevention. Previous A1c was 6.0, reduced to 5.9 in July. She is concerned about the effectiveness of metformin , but it is emphasized that it takes time to see full benefits, including reduction of visceral fat and effects on gut microbiome. - Continue metformin  for diabetes prevention. - Check hemoglobin A1c today

## 2024-10-29 NOTE — Assessment & Plan Note (Signed)
 Obesity management with recent weight loss due to increased physical activity and strength training. Challenges include maintaining a 1200 calorie diet and managing emotional eating. Discussed intermittent fasting and meal tracking for weight management. Consideration of GLP-1 agonists for appetite suppression, but she prefers to avoid additional medications. - Continue strength training and increase cardio exercises. - Track calorie intake using apps like My Fitness Pal or My Net Diary. - Consider intermittent fasting with a 16:8 schedule. - Use prepackaged meals or meal replacements to manage calorie intake. - Provided handout on holiday weight management strategies.

## 2024-10-30 ENCOUNTER — Ambulatory Visit (INDEPENDENT_AMBULATORY_CARE_PROVIDER_SITE_OTHER): Payer: Self-pay | Admitting: Internal Medicine

## 2024-10-30 LAB — LIPID PANEL WITH LDL/HDL RATIO
Cholesterol, Total: 194 mg/dL (ref 100–199)
HDL: 62 mg/dL (ref >39)
LDL Chol Calc (NIH): 109 mg/dL — ABNORMAL HIGH (ref 0–99)
LDL/HDL Ratio: 1.8 ratio (ref 0.0–3.2)
Triglycerides: 129 mg/dL (ref 0–149)
VLDL Cholesterol Cal: 23 mg/dL (ref 5–40)

## 2024-10-30 LAB — HEMOGLOBIN A1C
Est. average glucose Bld gHb Est-mCnc: 120 mg/dL
Hgb A1c MFr Bld: 5.8 % — ABNORMAL HIGH (ref 4.8–5.6)

## 2024-11-21 ENCOUNTER — Other Ambulatory Visit (INDEPENDENT_AMBULATORY_CARE_PROVIDER_SITE_OTHER): Payer: Self-pay | Admitting: Internal Medicine

## 2024-11-21 DIAGNOSIS — R7303 Prediabetes: Secondary | ICD-10-CM

## 2024-11-29 NOTE — Progress Notes (Signed)
 KAO BERKHEIMER                                          MRN: [id number]   11/29/2024   The [facility] Quality Team Specialist reviewed this patient medical record for the purposes of chart review for care gap closure. The following were reviewed: chart review for care gap closure-kidney health evaluation for diabetes:eGFR  and uACR.    [facility] Quality Team

## 2024-12-03 ENCOUNTER — Ambulatory Visit (INDEPENDENT_AMBULATORY_CARE_PROVIDER_SITE_OTHER): Admitting: Internal Medicine

## 2024-12-03 ENCOUNTER — Encounter (INDEPENDENT_AMBULATORY_CARE_PROVIDER_SITE_OTHER): Payer: Self-pay | Admitting: Internal Medicine

## 2024-12-03 VITALS — BP 127/85 | HR 69 | Temp 97.7°F | Ht 66.0 in | Wt 228.0 lb

## 2024-12-03 DIAGNOSIS — R7303 Prediabetes: Secondary | ICD-10-CM

## 2024-12-03 DIAGNOSIS — Z6836 Body mass index (BMI) 36.0-36.9, adult: Secondary | ICD-10-CM | POA: Diagnosis not present

## 2024-12-03 DIAGNOSIS — E66812 Obesity, class 2: Secondary | ICD-10-CM | POA: Diagnosis not present

## 2024-12-03 DIAGNOSIS — E7800 Pure hypercholesterolemia, unspecified: Secondary | ICD-10-CM | POA: Diagnosis not present

## 2024-12-03 NOTE — Progress Notes (Signed)
 "  Office: [phone]  /  Fax: [phone]  Weight Summary and Body Composition Analysis (BIA)  Vitals Temp: 97.7 F (36.5 C) Pulse Rate: 69 SpO2: 100 %   Anthropometric Measurements Height: 5' 6 (1.676 m) Weight: 228 lb (103.4 kg) BMI (Calculated): 36.82 Weight at Last Visit: 224 lb Weight Lost Since Last Visit: 0 lb Weight Gained Since Last Visit: 4 lb Starting Weight: 234 lb Total Weight Loss (lbs): 6 lb (2.722 kg) Peak Weight: 237 lb   Body Composition  Body Fat %: 45.8 % Fat Mass (lbs): 104.6 lbs Muscle Mass (lbs): 117.8 lbs Total Body Water (lbs): 86.4 lbs Visceral Fat Rating : 14    RMR: 1770  Today's Visit #: 9  Starting Date: 12/03/23   Subjective   Chief Complaint: Obesity  Interval History  Discussed the use of AI scribe software for clinical note transcription with the patient, who gave verbal consent to proceed.  History of Present Illness [name] is a 59 year old female with obesity and prediabetes who presents for medical weight management.  Since her last visit, she has gained four pounds. She is currently on a 1200 calorie nutrition plan, which she follows 60 % of the time. She exercises three days a week for 60 minutes, focusing on strength training. She is uncomfortable with taking medications, particularly metformin , due to concerns about long-term side effects and prefers to explore lifestyle changes and supplements like berberine.  She has a history of elevated LDL cholesterol, previously measured at 202 mg/dL, which improved to 890 mg/dL with rosuvastatin . She acknowledges the genetic component of her hypercholesterolemia. Her triglycerides have improved to 129 mg/dL.  She has stopped the rosuvastatin  for above reasons not because of side effects.   She plans to increase her physical activity by incorporating more cardio, such as swimming and walking, alongside her current strength training regimen.     Challenges  affecting patient progress: having difficulty preparing healthy meals, having difficulty with meal prep and planning, difficulty implementing reduced calorie nutrition plan, difficulty maintaining a reduced calorie state, and fear of taking medications.    Pharmacotherapy for weight management: She is currently taking Metformin  (off label use for weight management and / or insulin  resistance and / or diabetes prevention) patient has discontinued.   Assessment and Plan   Treatment Plan For Obesity:  Recommended Dietary Goals  [name] is currently in the action stage of change. As such, her goal is to continue weight management plan. She has agreed to: She was provided with a 7-day, 1200-calorie low-carb meal plan with protein smoothie options using artificial intelligence.  Meal plan is easy to prepare  Behavioral Health and Counseling  We discussed the following behavioral modification strategies today: continue to work on implementation of reduced calorie nutritional plan and focus on making small changes at a time.  Additional education and resources provided today: Provided with personal guidance and instructions on how to use [employer] for healthy meal ideas and cooking in bulk.  Recommended Physical Activity Goals  [name] has been advised to work up to 150 minutes of moderate intensity aerobic activity a week and strengthening exercises 2-3 times per week for cardiovascular health, weight loss maintenance and preservation of muscle mass.  She has agreed to :  Increase and monitor steps for a goal of 10,000 per day, Increase volume of physical activity to a goal of 240 minutes a week, and Combine aerobic and strengthening exercises for efficiency and improved cardiometabolic health.  Medical  Interventions and Pharmacotherapy  We discussed various medication options to help [name] with her weight loss efforts and we both agreed to : Discontinue metformin  due to patient preference not  because of side effects.  She is also not interested in antiobesity medications at present time  Associated Conditions Impacted by Obesity Treatment  Assessment & Plan Class 2 severe obesity with serious comorbidity and body mass index (BMI) of 36.0 to 36.9 in adult, unspecified obesity type Recent weight gain of four pounds. Currently on a 1200 calorie nutrition plan and exercising three days a week with strength training. Prefers lifestyle changes over medication for weight management. Discussed the risks of obesity, including increased risk of 129 medical conditions and cancers. Emphasized the importance of a calorie deficit for weight loss and the role of lifestyle changes in managing weight. - Continue 1200 calorie nutrition plan. - Continue strength training three days a week. - Focus on increasing physical activity, including cardio exercises like swimming and walking. - Created a 7-day, 1200 calorie, low-carb meal plan with protein smoothie options. - Encouraged use of resources like Eat This Much and Skinny Tastes for meal planning. - Emphasized the importance of a calorie deficit for weight loss. 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 Discussed the genetic nature of the condition and the effectiveness of statins in reducing cardiovascular risk by 30%. - Continue rosuvastatin  for cholesterol management. - Monitor cholesterol levels regularly. Prediabetes Concerns about long-term medication use. Discussed the risks of developing diabetes and the potential complications, including blindness, kidney disease, and neuropathy. Prefers lifestyle changes over metformin . Discussed the potential benefits of supplements like berberine and chromium, though evidence is limited. - Focus on lifestyle changes, including diet and exercise, to manage prediabetes. - Avoid  metformin  at this time. - Will check A1c in June to monitor glucose levels. - Encouraged low-carb diet and increased physical activity.          Objective   Physical Exam:  Pulse 69, temperature 97.7 F (36.5 C), height 5' 6 (1.676 m), weight 228 lb (103.4 kg), last menstrual period 11/29/2015, SpO2 100%. Body mass index is 36.8 kg/m.  General: She is overweight, cooperative, alert, well developed, and in no acute distress. PSYCH: Has normal mood, affect and thought process.   HEENT: EOMI, sclerae are anicteric. Lungs: Normal breathing effort, no conversational dyspnea. Extremities: No edema.  Neurologic: No gross sensory or motor deficits. No tremors or fasciculations noted.    Diagnostic Data Reviewed:  BMET    Component Value Date/Time   NA 139 12/13/2023 1218   K 4.5 12/13/2023 1218   CL 102 12/13/2023 1218   CO2 24 12/13/2023 1218   GLUCOSE 91 12/13/2023 1218   GLUCOSE 99 03/29/2023 0841   BUN 14 12/13/2023 1218   CREATININE 0.81 12/13/2023 1218   CREATININE 0.84 08/28/2020 1508   CALCIUM  9.8 12/13/2023 1218   GFRNONAA 84 (L) 10/23/2011 1637   GFRAA >90 10/23/2011 1637   Lab Results  Component Value Date   HGBA1C 5.8 (H) 10/29/2024   HGBA1C 6.1 (H) 06/01/2011   Lab Results  Component Value Date   INSULIN  12.7 12/13/2023   Lab Results  Component Value Date   TSH 1.080 12/13/2023   CBC    Component Value Date/Time   WBC 5.0 12/13/2023 1218   WBC 4.9 04/06/2022 1057   RBC 4.73 12/13/2023 1218   RBC 4.71 04/06/2022  1057   HGB 14.0 12/13/2023 1218   HCT 43.3 12/13/2023 1218   PLT 273 12/13/2023 1218   MCV 92 12/13/2023 1218   MCH 29.6 12/13/2023 1218   MCH 30.8 08/28/2020 1508   MCHC 32.3 12/13/2023 1218   MCHC 32.8 04/06/2022 1057   RDW 13.1 12/13/2023 1218   Iron Studies    Component Value Date/Time   IRON 97 12/13/2023 1218   TIBC 307 12/13/2023 1218   FERRITIN 81 12/13/2023 1218   IRONPCTSAT 32 12/13/2023 1218   Lipid Panel      Component Value Date/Time   CHOL 194 10/29/2024 0930   TRIG 129 10/29/2024 0930   HDL 62 10/29/2024 0930   CHOLHDL 4 03/29/2023 0841   VLDL 28.2 03/29/2023 0841   LDLCALC 109 (H) 10/29/2024 0930   LDLCALC 159 (H) 08/28/2020 1508   Hepatic Function Panel     Component Value Date/Time   PROT 7.0 12/13/2023 1218   ALBUMIN 4.5 12/13/2023 1218   AST 16 12/13/2023 1218   ALT 22 12/13/2023 1218   ALKPHOS 87 12/13/2023 1218   BILITOT 0.4 12/13/2023 1218      Component Value Date/Time   TSH 1.080 12/13/2023 1218   Nutritional Lab Results  Component Value Date   VD25OH 32.09 04/01/2024   VD25OH 20.81 (L) 10/16/2023   VD25OH 22.22 (L) 03/29/2023    Medications: Outpatient Encounter Medications as of 12/03/2024  Medication Sig   Cyanocobalamin  (VITAMIN B-12 PO) Take by mouth daily.   Multiple Vitamins-Minerals (MULTIVITAMIN WITH MINERALS) tablet Take 1 tablet by mouth daily.   Vitamin D , Ergocalciferol , (DRISDOL ) 1.25 MG (50000 UNIT) CAPS capsule TAKE 1 CAPSULE (50,000 UNITS TOTAL) BY MOUTH EVERY 7 (SEVEN) DAYS   metFORMIN  (GLUCOPHAGE -XR) 500 MG 24 hr tablet TAKE 1 TABLET BY MOUTH 2 TIMES DAILY WITH A MEAL. (Patient not taking: Reported on 12/03/2024)   rosuvastatin  (CRESTOR ) 10 MG tablet TAKE 1 TABLET BY MOUTH EVERY DAY (Patient not taking: Reported on 12/03/2024)   No facility-administered encounter medications on file as of 12/03/2024.     Follow-Up   No follow-ups on file.SABRA She was informed of the importance of frequent follow up visits to maximize her success with intensive lifestyle modifications for her multiple health conditions.  Attestation Statement   Reviewed by clinician on day of visit: allergies, medications, problem list, medical history, surgical history, family history, social history, and previous encounter notes.     Lucas Parker, MD  "

## 2024-12-03 NOTE — Assessment & Plan Note (Signed)
 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 Discussed the genetic nature of the condition and the effectiveness of statins in reducing cardiovascular risk by 30%. - Continue rosuvastatin  for cholesterol management. - Monitor cholesterol levels regularly.

## 2024-12-03 NOTE — Assessment & Plan Note (Signed)
 Recent weight gain of four pounds. Currently on a 1200 calorie nutrition plan and exercising three days a week with strength training. Prefers lifestyle changes over medication for weight management. Discussed the risks of obesity, including increased risk of 129 medical conditions and cancers. Emphasized the importance of a calorie deficit for weight loss and the role of lifestyle changes in managing weight. - Continue 1200 calorie nutrition plan. - Continue strength training three days a week. - Focus on increasing physical activity, including cardio exercises like swimming and walking. - Created a 7-day, 1200 calorie, low-carb meal plan with protein smoothie options. - Encouraged use of resources like Eat This Much and Skinny Tastes for meal planning. - Emphasized the importance of a calorie deficit for weight loss.

## 2024-12-03 NOTE — Assessment & Plan Note (Signed)
 Concerns about long-term medication use. Discussed the risks of developing diabetes and the potential complications, including blindness, kidney disease, and neuropathy. Prefers lifestyle changes over metformin . Discussed the potential benefits of supplements like berberine and chromium, though evidence is limited. - Focus on lifestyle changes, including diet and exercise, to manage prediabetes. - Avoid metformin  at this time. - Will check A1c in June to monitor glucose levels. - Encouraged low-carb diet and increased physical activity.

## 2024-12-18 ENCOUNTER — Ambulatory Visit (INDEPENDENT_AMBULATORY_CARE_PROVIDER_SITE_OTHER): Admitting: Family Medicine

## 2024-12-18 ENCOUNTER — Encounter: Payer: Self-pay | Admitting: Family Medicine

## 2024-12-18 VITALS — BP 112/78 | HR 70 | Temp 97.8°F | Ht 66.0 in | Wt 231.7 lb

## 2024-12-18 DIAGNOSIS — K439 Ventral hernia without obstruction or gangrene: Secondary | ICD-10-CM

## 2024-12-18 NOTE — Progress Notes (Signed)
 "  Established Patient Office Visit  Subjective   Patient ID: [name], female    DOB: 17-Jan-1966  Age: 59 y.o. MRN: [id number]  Chief Complaint  Patient presents with   Abdominal Pain    Patient complains of pain above umbilicus x1 week, states she was doing a lot of chore exercises and pain has since moved lower    Abdominal Pain  Discussed the use of AI scribe software for clinical note transcription with the patient, who gave verbal consent to proceed.  History of Present Illness   [name] is a 59 year old female with a history of a similar hernia who presents with abdominal pain and swelling above the belly button.  She developed hard, tender swelling above the belly button the day after a core workout, now present for about a week. The area feels possibly bruised and is painful to pressure, so she has avoided sit-ups and crunches. She is concerned this represents a recurrent hernia, similar to a prior episode. She denies skin discoloration or a popping sensation at the time of exercise.        Current Outpatient Medications  Medication Instructions   Cyanocobalamin  (VITAMIN B-12 PO) Daily   Multiple Vitamins-Minerals (MULTIVITAMIN WITH MINERALS) tablet 1 tablet, Oral, Daily   rosuvastatin  (CRESTOR ) 10 mg, Oral, Daily   Vitamin D  (Ergocalciferol ) (DRISDOL ) 50,000 Units, Oral, Every 7 days    Patient Active Problem List   Diagnosis Date Noted   Family history of hemochromatosis 12/13/2023   Class 2 severe obesity with serious comorbidity and body mass index (BMI) of 36.0 to 36.9 in adult 11/08/2023   Prediabetes 11/08/2023   Pure hypercholesterolemia 11/08/2023   B12 deficiency 07/29/2022   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  All other systems reviewed and are negative.     Objective:     BP 112/78   Pulse 70   Temp 97.8 F (36.6 C) (Oral)   Ht 5' 6 (1.676 m)   Wt 231 lb 11.2 oz (105.1 kg)   LMP 11/29/2015   SpO2 98%   BMI 37.40 kg/m    Physical Exam Vitals reviewed.  Constitutional:      Appearance: She is well-developed. She is obese.  Abdominal:     General: Bowel sounds are normal.     Palpations: Abdomen is soft.     Tenderness: There is abdominal tenderness (superior to the umbilicus in the midline).     Comments: There is a 4 cm area in the midline above the umbilicus that is tenderto palpation, there is a palpable mass of tissue just under the skin, that is well circumscribed, the mass appears to be consistent with fatty tissue.  Neurological:     Mental Status: She is alert.      No results found for any visits on 12/18/24.    The 10-year ASCVD risk score (Arnett DK, et al., 2019) is: 2.3%    Assessment & Plan:  Ventral hernia without obstruction or gangrene -     CT ABDOMEN PELVIS WO CONTRAST; Future   Assessment and Plan    Ventral hernia Suspected ventral hernia above the umbilicus with tenderness and swelling. Differential  diagnosis includes diastasis recti and extension of an umbilical hernia. The hernia is causing discomfort and tenderness, suggesting possible tissue involvement. Imaging is necessary to confirm the diagnosis and differentiate between diastasis recti and a ventral hernia. CT scan is preferred over ultrasound for better visualization. Surgery may be required if the hernia is confirmed and causing significant pain, but it is not immediately necessary. She has a court date on January 27th and prefers to delay further action until after this event. - Ordered CT scan of the abdomen to confirm diagnosis and differentiate between diastasis recti and ventral hernia. -  Advised scheduling CT scan at her convenience, potentially after January 27th. - Discussed potential need for surgical intervention if hernia is confirmed and causing significant pain. - Provided information on [facility] for scheduling the CT scan.        No follow-ups on file.    Heron CHRISTELLA Sharper, MD "

## 2024-12-27 ENCOUNTER — Encounter: Payer: Self-pay | Admitting: Family Medicine

## 2024-12-31 ENCOUNTER — Ambulatory Visit
Admission: RE | Admit: 2024-12-31 | Discharge: 2024-12-31 | Disposition: A | Source: Ambulatory Visit | Attending: Family Medicine

## 2024-12-31 DIAGNOSIS — K439 Ventral hernia without obstruction or gangrene: Secondary | ICD-10-CM

## 2025-01-03 ENCOUNTER — Ambulatory Visit: Payer: Self-pay | Admitting: Family Medicine

## 2025-01-03 DIAGNOSIS — K429 Umbilical hernia without obstruction or gangrene: Secondary | ICD-10-CM

## 2025-01-03 DIAGNOSIS — K389 Disease of appendix, unspecified: Secondary | ICD-10-CM

## 2025-01-28 ENCOUNTER — Ambulatory Visit (INDEPENDENT_AMBULATORY_CARE_PROVIDER_SITE_OTHER): Admitting: Internal Medicine

## 2025-04-02 ENCOUNTER — Encounter: Admitting: Family Medicine
